# Patient Record
Sex: Female | Born: 1980 | Race: Black or African American | Hispanic: No | Marital: Married | Smoking: Former smoker
Health system: Southern US, Community
[De-identification: ages and names within clinical notes are randomized; demographics above are authoritative.]

## PROBLEM LIST (undated history)

## (undated) DIAGNOSIS — J40 Bronchitis, not specified as acute or chronic: Secondary | ICD-10-CM

## (undated) DIAGNOSIS — C50919 Malignant neoplasm of unspecified site of unspecified female breast: Secondary | ICD-10-CM

## (undated) HISTORY — PX: FOOT SURGERY: SHX648

---
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
## 1998-07-02 ENCOUNTER — Emergency Department (HOSPITAL_COMMUNITY): Admission: EM | Admit: 1998-07-02 | Discharge: 1998-07-02 | Payer: Self-pay

## 1998-07-17 ENCOUNTER — Emergency Department (HOSPITAL_COMMUNITY): Admission: EM | Admit: 1998-07-17 | Discharge: 1998-07-17 | Payer: Self-pay | Admitting: Emergency Medicine

## 1999-05-24 ENCOUNTER — Emergency Department (HOSPITAL_COMMUNITY): Admission: EM | Admit: 1999-05-24 | Discharge: 1999-05-24 | Payer: Self-pay | Admitting: Emergency Medicine

## 1999-12-01 ENCOUNTER — Emergency Department (HOSPITAL_COMMUNITY): Admission: EM | Admit: 1999-12-01 | Discharge: 1999-12-01 | Payer: Self-pay | Admitting: Emergency Medicine

## 2000-02-16 ENCOUNTER — Emergency Department (HOSPITAL_COMMUNITY): Admission: EM | Admit: 2000-02-16 | Discharge: 2000-02-17 | Payer: Self-pay | Admitting: Internal Medicine

## 2002-04-21 ENCOUNTER — Emergency Department (HOSPITAL_COMMUNITY): Admission: EM | Admit: 2002-04-21 | Discharge: 2002-04-21 | Payer: Self-pay | Admitting: Emergency Medicine

## 2003-01-15 ENCOUNTER — Emergency Department (HOSPITAL_COMMUNITY): Admission: EM | Admit: 2003-01-15 | Discharge: 2003-01-15 | Payer: Self-pay | Admitting: Emergency Medicine

## 2003-01-15 ENCOUNTER — Encounter: Payer: Self-pay | Admitting: Emergency Medicine

## 2003-04-06 ENCOUNTER — Emergency Department (HOSPITAL_COMMUNITY): Admission: EM | Admit: 2003-04-06 | Discharge: 2003-04-07 | Payer: Self-pay | Admitting: Emergency Medicine

## 2005-01-09 ENCOUNTER — Emergency Department (HOSPITAL_COMMUNITY): Admission: EM | Admit: 2005-01-09 | Discharge: 2005-01-09 | Payer: Self-pay | Admitting: Emergency Medicine

## 2006-01-01 ENCOUNTER — Emergency Department (HOSPITAL_COMMUNITY): Admission: EM | Admit: 2006-01-01 | Discharge: 2006-01-01 | Payer: Self-pay | Admitting: Emergency Medicine

## 2009-04-24 ENCOUNTER — Emergency Department (HOSPITAL_COMMUNITY): Admission: EM | Admit: 2009-04-24 | Discharge: 2009-04-24 | Payer: Self-pay | Admitting: Emergency Medicine

## 2017-07-11 ENCOUNTER — Emergency Department (HOSPITAL_COMMUNITY)
Admission: EM | Admit: 2017-07-11 | Discharge: 2017-07-11 | Disposition: A | Discharge status: Home / Self Care | Payer: Self-pay | Attending: Emergency Medicine | Admitting: Emergency Medicine

## 2017-07-11 ENCOUNTER — Encounter (HOSPITAL_COMMUNITY): Payer: Self-pay | Admitting: Adult Health

## 2017-07-11 DIAGNOSIS — K0889 Other specified disorders of teeth and supporting structures: Secondary | ICD-10-CM | POA: Insufficient documentation

## 2017-07-11 MED ORDER — NAPROXEN 500 MG PO TABS: 500.0000 mg | ORAL_TABLET | Freq: Two times a day (BID) | ORAL | 0 refills | Status: DC

## 2017-07-11 MED ORDER — PENICILLIN V POTASSIUM 500 MG PO TABS: 500.0000 mg | ORAL_TABLET | Freq: Four times a day (QID) | ORAL | 0 refills | Status: AC

## 2017-07-11 NOTE — ED Provider Notes (Signed)
[facility] DEPT Provider Note   CSN: [id number] Arrival date & time: 07/11/17  7209     History   Chief Complaint Chief Complaint  Patient presents with  . Dental Pain    HPI Alicia Morton is a 36 y.o. female who presents to the emergency department with left-sided dental pain without purulent drainage x6 days. She denies recent trauma or injuries. She reports that she has not established with a dentist and has had problems with her teeth in the past. She denies fever, chills, drooling, facial swelling, trismus, or dysphasia. She reports one dose of ibuprofen 6 days ago but no other treatment prior to arrival.  The history is provided by the patient. No language interpreter was used.    History reviewed. No pertinent past medical history.  There are no active problems to display for this patient.   No past surgical history on file.  OB History    No data available       Home Medications    Prior to Admission medications   Medication Sig Start Date End Date Taking? Authorizing Provider  naproxen (NAPROSYN) 500 MG tablet Take 1 tablet (500 mg total) by mouth 2 (two) times daily. 07/11/17   Nichelle Renwick A, PA-C  penicillin v potassium (VEETID) 500 MG tablet Take 1 tablet (500 mg total) by mouth 4 (four) times daily. 07/11/17 07/18/17  Chynah Orihuela, Laymond Purser, PA-C    Family History History reviewed. No pertinent family history.  Social History Social History  Substance Use Topics  . Smoking status: Not on file  . Smokeless tobacco: Not on file  . Alcohol use Not on file     Allergies   Patient has no known allergies.   Review of Systems Review of Systems  Constitutional: Negative for chills and fever.  HENT: Positive for dental problem. Negative for drooling, facial swelling, mouth sores, sore throat and trouble swallowing.      Physical Exam Updated Vital Signs BP 119/74   Pulse 64   Temp 98.4 F (36.9 C) (Oral)   Resp 18   Wt 83.9 kg (185 lb)   LMP  06/14/2017 (Exact Date)   SpO2 100%   Physical Exam  Constitutional: No distress.  HENT:  Head: Normocephalic.  Mouth/Throat: Uvula is midline, oropharynx is clear and moist and mucous membranes are normal. No oral lesions. No trismus in the jaw. Abnormal dentition. Dental caries present. No dental abscesses or uvula swelling.    Tender to palpation over tooth 18 >15. No obvious abscesses. The surrounding gingiva is intact without erythema or purulence. No no facial swelling, trismus, or woody induration.  Eyes: Conjunctivae are normal.  Neck: Neck supple.  Cardiovascular: Normal rate and regular rhythm.  Exam reveals no gallop and no friction rub.   No murmur heard. Pulmonary/Chest: Effort normal. No respiratory distress.  Abdominal: Soft. She exhibits no distension.  Neurological: She is alert.  Skin: Skin is warm. No rash noted.  Psychiatric: Her behavior is normal.  Nursing note and vitals reviewed.    ED Treatments / Results  Labs (all labs ordered are listed, but only abnormal results are displayed) Labs Reviewed - No data to display  EKG  EKG Interpretation None       Radiology No results found.  Procedures Procedures (including critical care time)  Medications Ordered in ED Medications - No data to display   Initial Impression / Assessment and Plan / ED Course  I have reviewed the triage vital signs and the  nursing notes.  Pertinent labs & imaging results that were available during my care of the patient were reviewed by me and considered in my medical decision making (see chart for details).     Patient with toothache.  No gross abscess.  Exam unconcerning for Ludwig's angina or spread of infection.  Patient declines a dental block at this time. Consulted case management to get the patient establish with primary care for follow-up. Will treat with penicillin and pain medicine.  Urged patient to follow-up with dentist.    Final Clinical Impressions(s) /  ED Diagnoses   Final diagnoses:  Pain, dental    New Prescriptions New Prescriptions   NAPROXEN (NAPROSYN) 500 MG TABLET    Take 1 tablet (500 mg total) by mouth 2 (two) times daily.   PENICILLIN V POTASSIUM (VEETID) 500 MG TABLET    Take 1 tablet (500 mg total) by mouth 4 (four) times daily.     Joline Maxcy A, PA-C 07/11/17 1143    Pixie Casino, MD 07/11/17 1145

## 2017-07-11 NOTE — ED Triage Notes (Signed)
Presents with upper and lower left sided dental pain since Saturday. Tried Ibuprofen Saturday with no relief of pain, no other medications taken prior to arrival.

## 2017-07-11 NOTE — ED Notes (Signed)
Declined W/C at D/C and was escorted to lobby by RN. 

## 2017-07-11 NOTE — Care Management (Addendum)
ED CM received consult to assist patient with f/u primary care. CM reviewed record noted no PCP or health coverage listed in record. Met with patient at bedside, confirmed information. Patient reports not having a PCP or health insurance at. Discussed the [facility] with patient who is agreeable with establishing care at the clinic.   ED CM also discussed the importance and benefits of primary care. Discussed the services rendered at the [facility] .   Follow up appointment scheduled 8/6/ at 2p Information placed on AVS, also provided patient with a [facility] brochure.  Patient agrees with disposition plan to follow up at the clinic and is appreciative for the assistance.  Update the EDP who agrees with discharge plan. No further ED CM needs identified.

## 2017-07-11 NOTE — ED Notes (Signed)
Pt ambulated to room from waiting area. Pt ambulating with a steady gait and had no complaints.

## 2017-07-11 NOTE — Discharge Instructions (Signed)
You may take naproxen up to 2 times a day for pain and inflammation control. You may also apply ice for 15-20 minutes up to 3-4 times a day to help with pain and inflammation control.   Please take penicillin 4 times per day (every 6 hours) for the next 5 days.

## 2017-07-22 ENCOUNTER — Ambulatory Visit (INDEPENDENT_AMBULATORY_CARE_PROVIDER_SITE_OTHER): Payer: Self-pay | Admitting: Physician Assistant

## 2017-07-22 ENCOUNTER — Encounter (INDEPENDENT_AMBULATORY_CARE_PROVIDER_SITE_OTHER): Payer: Self-pay | Admitting: Physician Assistant

## 2017-07-22 VITALS — BP 118/70 | HR 78 | Temp 98.1°F | Ht 68.5 in | Wt 194.4 lb

## 2017-07-22 DIAGNOSIS — G8929 Other chronic pain: Secondary | ICD-10-CM

## 2017-07-22 DIAGNOSIS — K089 Disorder of teeth and supporting structures, unspecified: Secondary | ICD-10-CM

## 2017-07-22 MED ORDER — NAPROXEN 500 MG PO TABS: 500.0000 mg | ORAL_TABLET | Freq: Two times a day (BID) | ORAL | 0 refills | Status: DC

## 2017-07-22 MED ORDER — AMOXICILLIN-POT CLAVULANATE 875-125 MG PO TABS: 1.0000 | ORAL_TABLET | Freq: Two times a day (BID) | ORAL | 0 refills | Status: AC

## 2017-07-22 NOTE — Progress Notes (Signed)
Subjective:  Patient ID: Alicia Morton, female    DOB: 06-13-1981  Age: 36 y.o. MRN: [id number]  CC: dental pain  HPI Alicia Morton is a 36 y.o. female with a PMH of chronic dental pain. Had left sided dental pain that led her to the ED on 07/11/17. Took the prescribed antibiotic and NSAID with resolution of the dental swelling. There is still pain especially with chewing, drinking, and air contact. Denies CP, rash, urinary dysfunction, facial swelling, drooling, neck swelling, or sublingual swelling. Does not endorse any other symptom or complaint.  ROS Review of Systems  Constitutional: Negative for chills, fever and malaise/fatigue.  HENT:       Dental pain  Eyes: Negative for blurred vision.  Respiratory: Negative for shortness of breath.   Cardiovascular: Negative for chest pain and palpitations.  Gastrointestinal: Negative for abdominal pain and nausea.  Genitourinary: Negative for dysuria and hematuria.  Musculoskeletal: Negative for joint pain and myalgias.  Skin: Negative for rash.  Neurological: Negative for tingling and headaches.  Psychiatric/Behavioral: Negative for depression. The patient is not nervous/anxious.     Objective:  BP 118/70 (BP Location: Left Arm, Patient Position: Sitting, Cuff Size: Normal)   Pulse 78   Temp 98.1 F (36.7 C) (Oral)   Ht 5' 8.5" (1.74 m)   Wt 194 lb 6.4 oz (88.2 kg)   LMP 07/14/2017 (Exact Date)   SpO2 97%   BMI 29.13 kg/m   BP/Weight 07/22/2017 04/21/3975  Systolic BP 734 193  Diastolic BP 70 74  Wt. (Lbs) 194.4 185  BMI 29.13 -      Physical Exam  Constitutional: She is oriented to person, place, and time.  Well developed, well nourished, NAD, polite  HENT:  Head: Normocephalic and atraumatic.  Tooth number 18 and 1 in poor condition, no gingival swelling, suppuration, or bleeding.  Eyes: No scleral icterus.  Neck: Normal range of motion. Neck supple.  Cardiovascular: Normal rate, regular rhythm and normal heart  sounds.  Exam reveals no gallop and no friction rub.   No murmur heard. Pulmonary/Chest: Effort normal and breath sounds normal.  Musculoskeletal: She exhibits no edema.  Lymphadenopathy:    She has no cervical adenopathy.  Neurological: She is alert and oriented to person, place, and time. No cranial nerve deficit. Coordination normal.  Skin: Skin is warm and dry. No rash noted. No erythema. No pallor.  Psychiatric: She has a normal mood and affect. Her behavior is normal. Thought content normal.  Vitals reviewed.    Assessment & Plan:     1. Chronic dental pain - Begin amoxicillin-clavulanate (AUGMENTIN) 875-125 MG tablet; Take 1 tablet by mouth 2 (two) times daily.  Dispense: 20 tablet; Refill: 0 - Refill naproxen (NAPROSYN) 500 MG tablet; Take 1 tablet (500 mg total) by mouth 2 (two) times daily.  Dispense: 30 tablet; Refill: 0 - Ambulatory referral to Dentistry   Meds ordered this encounter  Medications  . amoxicillin-clavulanate (AUGMENTIN) 875-125 MG tablet    Sig: Take 1 tablet by mouth 2 (two) times daily.    Dispense:  20 tablet    Refill:  0    Order Specific Question:   Supervising Provider    Answer:   Tresa Garter [id number]  . naproxen (NAPROSYN) 500 MG tablet    Sig: Take 1 tablet (500 mg total) by mouth 2 (two) times daily.    Dispense:  30 tablet    Refill:  0    Order Specific Question:  Supervising Provider    Answer:   Tresa Garter [[id number]]    Follow-up: PRN  Clent Demark PA

## 2017-07-22 NOTE — Patient Instructions (Signed)
Dental Pain Dental pain may be caused by many things, including:  Tooth decay (cavities or caries). Cavities expose the nerve of your tooth to air and hot or cold temperatures. This can cause pain or discomfort.  Abscess or infection. A dental abscess is a collection of infected pus from a bacterial infection in the inner part of the tooth (pulp). It usually occurs at the end of the tooth's root.  Injury.  An unknown reason (idiopathic).  Your pain may be mild or severe. It may only occur when:  You are chewing.  You are exposed to hot or cold temperature.  You are eating or drinking sugary foods or beverages, such as soda or candy.  Your pain may also be constant. Follow these instructions at home: Watch your dental pain for any changes. The following actions may help to lessen any discomfort that you are feeling:  Take medicines only as directed by your dentist.  If you were prescribed an antibiotic medicine, finish all of it even if you start to feel better.  Keep all follow-up visits as directed by your dentist. This is important.  Do not apply heat to the outside of your face.  Rinse your mouth or gargle with salt water if directed by your dentist. This helps with pain and swelling. ? You can make salt water by adding  tsp of salt to 1 cup of warm water.  Apply ice to the painful area of your face: ? Put ice in a plastic bag. ? Place a towel between your skin and the bag. ? Leave the ice on for 20 minutes, 2-3 times per day.  Avoid foods or drinks that cause you pain, such as: ? Very hot or very cold foods or drinks. ? Sweet or sugary foods or drinks.  Contact a health care provider if:  Your pain is not controlled with medicines.  Your symptoms are worse.  You have new symptoms. Get help right away if:  You are unable to open your mouth.  You are having trouble breathing or swallowing.  You have a fever.  Your face, neck, or jaw is swollen. This  information is not intended to replace advice given to you by your health care provider. Make sure you discuss any questions you have with your health care provider. Document Released: 12/03/2005 Document Revised: 04/12/2016 Document Reviewed: 11/29/2014 Elsevier Interactive Patient Education  2017 [employer].

## 2020-03-24 ENCOUNTER — Other Ambulatory Visit: Payer: Self-pay

## 2020-03-24 ENCOUNTER — Ambulatory Visit (INDEPENDENT_AMBULATORY_CARE_PROVIDER_SITE_OTHER): Payer: Self-pay

## 2020-03-24 ENCOUNTER — Ambulatory Visit
Admission: EM | Admit: 2020-03-24 | Discharge: 2020-03-24 | Disposition: A | Discharge status: Home / Self Care | Payer: Self-pay

## 2020-03-24 DIAGNOSIS — J42 Unspecified chronic bronchitis: Secondary | ICD-10-CM

## 2020-03-24 DIAGNOSIS — R062 Wheezing: Secondary | ICD-10-CM

## 2020-03-24 HISTORY — DX: Bronchitis, not specified as acute or chronic: J40

## 2020-03-24 MED ORDER — PREDNISONE 20 MG PO TABS: 40.0000 mg | ORAL_TABLET | Freq: Every day | ORAL | 0 refills | Status: DC

## 2020-03-24 MED ORDER — ALBUTEROL SULFATE 0.63 MG/3ML IN NEBU: 1.0000 | INHALATION_SOLUTION | Freq: Four times a day (QID) | RESPIRATORY_TRACT | 0 refills | Status: DC | PRN

## 2020-03-24 NOTE — ED Triage Notes (Signed)
Pt c/o wheezing and SOB since last Tuesday. States it started with a cough and congestion that was treated with OTC over a week ago. States using her inhaler with some relief, out of neb meds and her machine not working right. Pt speaking in complete sentences, no distress at this time.

## 2020-03-24 NOTE — ED Provider Notes (Signed)
[name]    CSN: [id number] Arrival date & time: 03/24/20  1144      History   Chief Complaint Chief Complaint  Patient presents with  . Wheezing    HPI Alicia Morton is a 39 y.o. female with history of chronic bronchitis, former tobacco use presenting for wheezing and increased difficulty breathing since last Tuesday.  Patient states that she has productive cough without hemoptysis and can hear herself wheezing.  States she has a nebulizer at home, though does not work like it used to: Unsure if is broken.  Does not have albuterol nebulizer solution at home.  Has been taking OTC medications for cough with moderate relief.  Patient denying fever, known sick contacts, chest pain or palpitations, lightheadedness, nausea.  Patient states this feels consistent to previous flares, for which she usually goes to the ER to get a nebulizer treatment and steroids.  Patient denies hospitalization for respiratory issues.   Past Medical History:  Diagnosis Date  . Bronchitis     There are no problems to display for this patient.   History reviewed. No pertinent surgical history.  OB History   No obstetric history on file.      Home Medications    Prior to Admission medications   Medication Sig Start Date End Date Taking? Authorizing Provider  albuterol (VENTOLIN HFA) 108 (90 Base) MCG/ACT inhaler Inhale into the lungs every 6 (six) hours as needed for wheezing or shortness of breath.   Yes [provider]  albuterol (ACCUNEB) 0.63 MG/3ML nebulizer solution Take 3 mLs (0.63 mg total) by nebulization every 6 (six) hours as needed for wheezing. 03/24/20   Hall-Potvin, [place], PA-C  predniSONE (DELTASONE) 20 MG tablet Take 2 tablets (40 mg total) by mouth daily. 03/24/20   Hall-Potvin, [place], PA-C    Family History History reviewed. No pertinent family history.  Social History Social History   Tobacco Use  . Smoking status: Never Smoker  . Smokeless  tobacco: Never Used  Substance Use Topics  . Alcohol use: Not Currently  . Drug use: Not Currently     Allergies   Patient has no known allergies.   Review of Systems As per HPI   Physical Exam Triage Vital Signs ED Triage Vitals  Enc Vitals Group     BP      Pulse      Resp      Temp      Temp src      SpO2      Weight      Height      Head Circumference      Peak Flow      Pain Score      Pain Loc      Pain Edu?      Excl. in [place]?    No data found.  Updated Vital Signs BP 139/82 (BP Location: Left Arm)   Pulse 67   Temp 98.1 F (36.7 C) (Oral)   Resp 18   LMP 02/24/2020   SpO2 94%   Visual Acuity Right Eye Distance:   Left Eye Distance:   Bilateral Distance:    Right Eye Near:   Left Eye Near:    Bilateral Near:     Physical Exam Constitutional:      General: She is not in acute distress.    Appearance: She is obese. She is not ill-appearing or diaphoretic.  HENT:     Head: Normocephalic and atraumatic.  Mouth/Throat:     Mouth: Mucous membranes are moist.     Pharynx: Oropharynx is clear. No oropharyngeal exudate or posterior oropharyngeal erythema.  Eyes:     General: No scleral icterus.    Conjunctiva/sclera: Conjunctivae normal.     Pupils: Pupils are equal, round, and reactive to light.  Neck:     Comments: Trachea midline, negative JVD Cardiovascular:     Rate and Rhythm: Normal rate and regular rhythm.     Heart sounds: No murmur. No gallop.   Pulmonary:     Effort: Pulmonary effort is normal. No respiratory distress.     Breath sounds: Wheezing and rhonchi present. No rales.     Comments: Adventitia noted bilaterally.  Decreased air movement Musculoskeletal:     Cervical back: Neck supple. No tenderness.  Lymphadenopathy:     Cervical: No cervical adenopathy.  Skin:    Capillary Refill: Capillary refill takes less than 2 seconds.     Coloration: Skin is not jaundiced or pale.     Findings: No rash.  Neurological:      General: No focal deficit present.     Mental Status: She is alert and oriented to person, place, and time.      UC Treatments / Results  Labs (all labs ordered are listed, but only abnormal results are displayed) Labs Reviewed - No data to display  EKG   Radiology DG Chest 2 View  Result Date: 03/24/2020 CLINICAL DATA:  Cough, dyspnea. EXAM: CHEST - 2 VIEW COMPARISON:  Apr 24, 2009. FINDINGS: The heart size and mediastinal contours are within normal limits. Both lungs are clear. No pneumothorax or pleural effusion is noted. The visualized skeletal structures are unremarkable. IMPRESSION: No active cardiopulmonary disease. Electronically Signed   By: Marijo Conception M.D.   On: 03/24/2020 12:41    Procedures Procedures (including critical care time)  Medications Ordered in UC Medications - No data to display  Initial Impression / Assessment and Plan / UC Course  I have reviewed the triage vital signs and the nursing notes.  Pertinent labs & imaging results that were available during my care of the patient were reviewed by me and considered in my medical decision making (see chart for details).     Patient afebrile, nontoxic, and without acute respiratory distress.  Given history of chronic bronchitis, cough greater than 1 week with dyspnea and poor lung exam x-ray obtained.  This is reviewed by me and radiology: Negative for pneumothorax, effusion, infiltrate.  Reviewed findings with patient who verbalized understanding.  Will treat for chronic bronchitis as outlined below.  Patient received nebulizer equipment in office: Forms completed at time of visit.  Return precautions discussed, patient verbalized understanding and is agreeable to plan. Final Clinical Impressions(s) / UC Diagnoses   Final diagnoses:  Chronic bronchitis, unspecified chronic bronchitis type [facility])     Discharge Instructions     Use nebulizer as directed. Take prednisone every morning with  breakfast. Follow-up with primary care for persistent or worsening symptoms.    ED Prescriptions    Medication Sig Dispense Auth. Provider   albuterol (ACCUNEB) 0.63 MG/3ML nebulizer solution Take 3 mLs (0.63 mg total) by nebulization every 6 (six) hours as needed for wheezing. 75 mL Hall-Potvin, [place], PA-C   predniSONE (DELTASONE) 20 MG tablet Take 2 tablets (40 mg total) by mouth daily. 5 tablet Hall-Potvin, [place], PA-C     PDMP not reviewed this encounter.   Hall-Potvin, [place], [place] 03/24/20 1315

## 2020-03-24 NOTE — Discharge Instructions (Addendum)
Use nebulizer as directed. Take prednisone every morning with breakfast. Follow-up with primary care for persistent or worsening symptoms.

## 2020-04-06 ENCOUNTER — Ambulatory Visit
Admission: EM | Admit: 2020-04-06 | Discharge: 2020-04-06 | Disposition: A | Discharge status: Home / Self Care | Payer: Self-pay

## 2020-10-10 ENCOUNTER — Ambulatory Visit
Admission: EM | Admit: 2020-10-10 | Discharge: 2020-10-10 | Disposition: A | Discharge status: Home / Self Care | Payer: Self-pay | Attending: Physician Assistant | Admitting: Physician Assistant

## 2020-10-10 DIAGNOSIS — J209 Acute bronchitis, unspecified: Secondary | ICD-10-CM

## 2020-10-10 MED ORDER — ALBUTEROL SULFATE 0.63 MG/3ML IN NEBU: 1.0000 | INHALATION_SOLUTION | Freq: Four times a day (QID) | RESPIRATORY_TRACT | 0 refills | Status: DC | PRN

## 2020-10-10 MED ORDER — ALBUTEROL SULFATE HFA 108 (90 BASE) MCG/ACT IN AERS: 1.0000 | INHALATION_SPRAY | Freq: Four times a day (QID) | RESPIRATORY_TRACT | 0 refills | Status: DC | PRN

## 2020-10-10 MED ORDER — ALBUTEROL SULFATE HFA 108 (90 BASE) MCG/ACT IN AERS
2.0000 | INHALATION_SPRAY | Freq: Once | RESPIRATORY_TRACT | Status: AC
  Administered 2020-10-10: 2 via RESPIRATORY_TRACT

## 2020-10-10 MED ORDER — PREDNISONE 50 MG PO TABS: 50.0000 mg | ORAL_TABLET | Freq: Every day | ORAL | 0 refills | Status: DC

## 2020-10-10 NOTE — ED Triage Notes (Signed)
Pt c/o bronchitis flare up, dry cough since last Monday with SOB. Pt speaking in complete sentences, no distress noted.

## 2020-10-10 NOTE — Discharge Instructions (Signed)
Continue albuterol as needed. Add prednisone if symptoms not improving. Follow up with PCP if requiring frequency use of inhaler.  Monitor for any worsening of symptoms, chest pain, shortness of breath, wheezing, swelling of the throat, go to the emergency department for further evaluation needed.

## 2020-10-10 NOTE — ED Provider Notes (Signed)
[facility]    CSN: [id number] Arrival date & time: 10/10/20  1336      History   Chief Complaint Chief Complaint  Patient presents with  . Cough    HPI Alicia Morton is a 39 y.o. female.   39 year old female comes in for 1 week history of cough. Short of breath with chest tightness, worse with exertion. Denies rhinorrhea, nasal congestion, sore throat. Denies fever, chills, body aches. Denies loss of taste/smell. Former smoker.      Past Medical History:  Diagnosis Date  . Bronchitis     There are no problems to display for this patient.   History reviewed. No pertinent surgical history.  OB History   No obstetric history on file.      Home Medications    Prior to Admission medications   Medication Sig Start Date End Date Taking? Authorizing Provider  albuterol (ACCUNEB) 0.63 MG/3ML nebulizer solution Take 3 mLs (0.63 mg total) by nebulization every 6 (six) hours as needed for wheezing. 10/10/20   Tasia Catchings, Diar Berkel V, PA-C  albuterol (VENTOLIN HFA) 108 (90 Base) MCG/ACT inhaler Inhale 1-2 puffs into the lungs every 6 (six) hours as needed for wheezing or shortness of breath. 10/10/20   Tasia Catchings, Nayshawn Mesta V, PA-C  predniSONE (DELTASONE) 50 MG tablet Take 1 tablet (50 mg total) by mouth daily with breakfast. 10/10/20   Ok Edwards, PA-C    Family History History reviewed. No pertinent family history.  Social History Social History   Tobacco Use  . Smoking status: Former Smoker    Packs/day: 1.00    Years: 16.00    Pack years: 16.00    Types: Cigarettes, E-cigarettes  . Smokeless tobacco: Never Used  Substance Use Topics  . Alcohol use: Not Currently  . Drug use: Not Currently     Allergies   Patient has no known allergies.   Review of Systems Review of Systems  Reason unable to perform ROS: See HPI as above.    Physical Exam Triage Vital Signs ED Triage Vitals [10/10/20 1439]  Enc Vitals Group     BP 129/78     Pulse Rate 69     Resp 16      Temp 98.8 F (37.1 C)     Temp Source Oral     SpO2 98 %     Weight      Height      Head Circumference      Peak Flow      Pain Score      Pain Loc      Pain Edu?      Excl. in [place]?    No data found.  Updated Vital Signs BP 129/78 (BP Location: Left Arm)   Pulse 69   Temp 98.8 F (37.1 C) (Oral)   Resp 16   LMP 09/11/2020   SpO2 98%   Physical Exam Constitutional:      General: She is not in acute distress.    Appearance: Normal appearance. She is not ill-appearing, toxic-appearing or diaphoretic.  HENT:     Head: Normocephalic and atraumatic.     Mouth/Throat:     Mouth: Mucous membranes are moist.     Pharynx: Oropharynx is clear. Uvula midline.  Cardiovascular:     Rate and Rhythm: Normal rate and regular rhythm.     Heart sounds: Normal heart sounds. No murmur heard.  No friction rub. No gallop.   Pulmonary:  Effort: Pulmonary effort is normal. No accessory muscle usage, prolonged expiration, respiratory distress or retractions.     Comments: Decreased air movement. Diffuse expiratory wheezing and rhonchi.   Albuterol 2 puffs x 1: improvement of air movement. Increased inspiratory and expiratory wheezing. Rhonchi in the periphery  Albuterol 2 puffs x 2: good air movement. LCTAB Musculoskeletal:     Cervical back: Normal range of motion and neck supple.  Neurological:     General: No focal deficit present.     Mental Status: She is alert and oriented to person, place, and time.      UC Treatments / Results  Labs (all labs ordered are listed, but only abnormal results are displayed) Labs Reviewed - No data to display  EKG   Radiology No results found.  Procedures Procedures (including critical care time)  Medications Ordered in UC Medications  albuterol (VENTOLIN HFA) 108 (90 Base) MCG/ACT inhaler 2 puff (2 puffs Inhalation Given 10/10/20 1513)    Initial Impression / Assessment and Plan / UC Course  I have reviewed the triage vital signs  and the nursing notes.  Pertinent labs & imaging results that were available during my care of the patient were reviewed by me and considered in my medical decision making (see chart for details).    Albuterol as directed. Rx of prednisone, if symptoms not improving with albuterol, to take for bronchitis. No increase in mucous production, LCTAB after albuterol, no indication for abx at this time. Return precautions given.  Final Clinical Impressions(s) / UC Diagnoses   Final diagnoses:  Acute bronchitis, unspecified organism    ED Prescriptions    Medication Sig Dispense Auth. Provider   albuterol (ACCUNEB) 0.63 MG/3ML nebulizer solution Take 3 mLs (0.63 mg total) by nebulization every 6 (six) hours as needed for wheezing. 75 mL Herberta Pickron V, PA-C   albuterol (VENTOLIN HFA) 108 (90 Base) MCG/ACT inhaler Inhale 1-2 puffs into the lungs every 6 (six) hours as needed for wheezing or shortness of breath. 18 g Harlon Kutner V, PA-C   predniSONE (DELTASONE) 50 MG tablet Take 1 tablet (50 mg total) by mouth daily with breakfast. 5 tablet Ok Edwards, PA-C     PDMP not reviewed this encounter.   Ok Edwards, PA-C 10/10/20 1514

## 2020-11-22 ENCOUNTER — Other Ambulatory Visit: Payer: Self-pay

## 2020-11-22 ENCOUNTER — Telehealth: Payer: Self-pay

## 2020-11-22 ENCOUNTER — Ambulatory Visit
Admission: EM | Admit: 2020-11-22 | Discharge: 2020-11-22 | Disposition: A | Discharge status: Home / Self Care | Payer: Self-pay

## 2020-11-22 DIAGNOSIS — Z1152 Encounter for screening for COVID-19: Secondary | ICD-10-CM

## 2020-11-22 DIAGNOSIS — J209 Acute bronchitis, unspecified: Secondary | ICD-10-CM

## 2020-11-22 MED ORDER — BENZONATATE 200 MG PO CAPS: 200.0000 mg | ORAL_CAPSULE | Freq: Three times a day (TID) | ORAL | 0 refills | Status: DC | PRN

## 2020-11-22 MED ORDER — IPRATROPIUM-ALBUTEROL 0.5-2.5 (3) MG/3ML IN SOLN: 3.0000 mL | RESPIRATORY_TRACT | 0 refills | Status: DC | PRN

## 2020-11-22 MED ORDER — FLUTICASONE PROPIONATE 50 MCG/ACT NA SUSP
1.0000 | Freq: Every day | NASAL | 0 refills | Status: DC
Start: 2020-11-22 — End: 2021-04-18

## 2020-11-22 MED ORDER — HYDROCODONE-HOMATROPINE 5-1.5 MG/5ML PO SYRP
5.0000 mL | ORAL_SOLUTION | Freq: Every evening | ORAL | 0 refills | Status: DC | PRN
Start: 2020-11-22 — End: 2021-04-15

## 2020-11-22 MED ORDER — PREDNISONE 50 MG PO TABS: 50.0000 mg | ORAL_TABLET | Freq: Every day | ORAL | 0 refills | Status: DC

## 2020-11-22 MED ORDER — DEXAMETHASONE 10 MG/ML FOR PEDIATRIC ORAL USE: 10.0000 mg | Freq: Once | INTRAMUSCULAR | Status: DC

## 2020-11-22 MED ORDER — PREDNISONE 50 MG PO TABS: 50.0000 mg | ORAL_TABLET | Freq: Every day | ORAL | 0 refills | Status: AC

## 2020-11-22 MED ORDER — BENZONATATE 200 MG PO CAPS: 200.0000 mg | ORAL_CAPSULE | Freq: Three times a day (TID) | ORAL | 0 refills | Status: AC | PRN

## 2020-11-22 MED ORDER — HYDROCODONE-HOMATROPINE 5-1.5 MG/5ML PO SYRP
5.0000 mL | ORAL_SOLUTION | Freq: Every evening | ORAL | 0 refills | Status: DC | PRN
Start: 2020-11-22 — End: 2020-11-22

## 2020-11-22 MED ORDER — FLUTICASONE PROPIONATE 50 MCG/ACT NA SUSP
1.0000 | Freq: Every day | NASAL | 0 refills | Status: DC
Start: 2020-11-22 — End: 2020-11-22

## 2020-11-22 NOTE — Discharge Instructions (Signed)
Covid test pending We gave you a dose of Decadron today, continue with prednisone daily for the next 5 days with food Continue albuterol inhaler as needed, continue breathing treatments when at home Tessalon for cough Hycodan for nighttime cough-will cause drowsiness, do not drive or work after taking Rest and fluids Follow-up if not improving or worsening

## 2020-11-22 NOTE — Telephone Encounter (Signed)
TC to [facility] spoke w/ Damian Leavell from pharmacy. Cancelled all Rx's prescribed today by H. Wieters, PA d/t pt's request to change pharmacies after Rx's were sent.

## 2020-11-22 NOTE — ED Triage Notes (Signed)
Pt presents to Urgent Care with c/o cough and R ear pain x approx 1 week.  Pt w/ no known COVID exposure; has not been vaccinated.

## 2020-11-22 NOTE — ED Provider Notes (Signed)
[name]    CSN: [id number] Arrival date & time: 11/22/20  1350      History   Chief Complaint Chief Complaint  Patient presents with  . Cough  . Otalgia    HPI Alicia Morton is a 39 y.o. female presenting today for evaluation of cough and congestion.  Reports symptoms over the past week, reports a lot of shortness of breath chest tightness and wheezing.  History of recurrent bronchitis.  Reports former tobacco use, quit approximately 10 years ago.  Has been using albuterol inhaler and nebulizers at home without full relief.  Typically has improvement with steroids.  Cough keeping up at nighttime.  Denies fevers.  Denies Covid exposures.  HPI  Past Medical History:  Diagnosis Date  . Bronchitis     There are no problems to display for this patient.   History reviewed. No pertinent surgical history.  OB History   No obstetric history on file.      Home Medications    Prior to Admission medications   Medication Sig Start Date End Date Taking? Authorizing Provider  albuterol (ACCUNEB) 0.63 MG/3ML nebulizer solution Take 3 mLs (0.63 mg total) by nebulization every 6 (six) hours as needed for wheezing. 10/10/20   Tasia Catchings, Amy V, PA-C  albuterol (VENTOLIN HFA) 108 (90 Base) MCG/ACT inhaler Inhale 1-2 puffs into the lungs every 6 (six) hours as needed for wheezing or shortness of breath. 10/10/20   Tasia Catchings, Amy V, PA-C  benzonatate (TESSALON) 200 MG capsule Take 1 capsule (200 mg total) by mouth 3 (three) times daily as needed for up to 7 days for cough. 11/22/20 11/29/20  Kanesha Cadle C, PA-C  HYDROcodone-homatropine (HYCODAN) 5-1.5 MG/5ML syrup Take 5 mLs by mouth at bedtime as needed for cough. 11/22/20   Cayley Pester C, PA-C  ipratropium-albuterol (DUONEB) 0.5-2.5 (3) MG/3ML SOLN Take 3 mLs by nebulization every 4 (four) hours as needed. 11/22/20   Santanna Whitford C, PA-C  predniSONE (DELTASONE) 50 MG tablet Take 1 tablet (50 mg total) by mouth daily with  breakfast for 5 days. 11/22/20 11/27/20  Khalidah Herbold, Elesa Hacker, PA-C    Family History Family History  Problem Relation Age of Onset  . Hypertension Mother   . Hypertension Father   . Diabetes Father     Social History Social History   Tobacco Use  . Smoking status: Former Smoker    Packs/day: 1.00    Years: 16.00    Pack years: 16.00    Types: Cigarettes, E-cigarettes  . Smokeless tobacco: Never Used  Substance Use Topics  . Alcohol use: Not Currently  . Drug use: Not Currently     Allergies   Patient has no known allergies.   Review of Systems Review of Systems  Constitutional: Negative for activity change, appetite change, chills, fatigue and fever.  HENT: Positive for congestion. Negative for ear pain, rhinorrhea, sinus pressure, sore throat and trouble swallowing.   Eyes: Negative for discharge and redness.  Respiratory: Positive for cough. Negative for chest tightness and shortness of breath.   Cardiovascular: Negative for chest pain.  Gastrointestinal: Negative for abdominal pain, diarrhea, nausea and vomiting.  Musculoskeletal: Negative for myalgias.  Skin: Negative for rash.  Neurological: Negative for dizziness, light-headedness and headaches.     Physical Exam Triage Vital Signs ED Triage Vitals  Enc Vitals Group     BP      Pulse      Resp      Temp  Temp src      SpO2      Weight      Height      Head Circumference      Peak Flow      Pain Score      Pain Loc      Pain Edu?      Excl. in [place]?    No data found.  Updated Vital Signs BP (!) 122/57 (BP Location: Left Arm)   Pulse 70   Temp 98.1 F (36.7 C) (Oral)   Resp 20   Ht 5\' 10"  (1.778 m)   Wt 205 lb (93 kg)   LMP 11/12/2020   SpO2 93%   BMI 29.41 kg/m   Visual Acuity Right Eye Distance:   Left Eye Distance:   Bilateral Distance:    Right Eye Near:   Left Eye Near:    Bilateral Near:     Physical Exam Vitals and nursing note reviewed.  Constitutional:       Appearance: She is well-developed.     Comments: No acute distress  HENT:     Head: Normocephalic and atraumatic.     Ears:     Comments: Bilateral ears without tenderness to palpation of external auricle, tragus and mastoid, EAC's without erythema or swelling, TM's with good bony landmarks and cone of light. Non erythematous.     Nose: Nose normal.     Mouth/Throat:     Comments: Oral mucosa pink and moist, no tonsillar enlargement or exudate. Posterior pharynx patent and nonerythematous, no uvula deviation or swelling. Normal phonation. Eyes:     Conjunctiva/sclera: Conjunctivae normal.  Cardiovascular:     Rate and Rhythm: Normal rate.  Pulmonary:     Effort: Pulmonary effort is normal. No respiratory distress.     Comments: Breathing comfortably at rest, inspiratory and expiratory wheezing throughout bilateral lung fields Abdominal:     General: There is no distension.  Musculoskeletal:        General: Normal range of motion.     Cervical back: Neck supple.  Skin:    General: Skin is warm and dry.  Neurological:     Mental Status: She is alert and oriented to person, place, and time.      UC Treatments / Results  Labs (all labs ordered are listed, but only abnormal results are displayed) Labs Reviewed  NOVEL CORONAVIRUS, NAA    EKG   Radiology No results found.  Procedures Procedures (including critical care time)  Medications Ordered in UC Medications  dexamethasone (DECADRON) 10 MG/ML injection for Pediatric ORAL use 10 mg (has no administration in time range)    Initial Impression / Assessment and Plan / UC Course  I have reviewed the triage vital signs and the nursing notes.  Pertinent labs & imaging results that were available during my care of the patient were reviewed by me and considered in my medical decision making (see chart for details).     Covid test pending, treating for bronchitis, continue albuterol inhaler nebulizers, providing DuoNeb.   Providing Decadron prior to discharge and continuing on prednisone x5 days.  Tessalon for daytime cough, Hycodan for nighttime cough.  Continue to monitor breathing and wheezing,Discussed strict return precautions. Patient verbalized understanding and is agreeable with plan.  Final Clinical Impressions(s) / UC Diagnoses   Final diagnoses:  Encounter for screening for COVID-19  Acute bronchitis, unspecified organism     Discharge Instructions     Covid test pending We gave  you a dose of Decadron today, continue with prednisone daily for the next 5 days with food Continue albuterol inhaler as needed, continue breathing treatments when at home Tessalon for cough Hycodan for nighttime cough-will cause drowsiness, do not drive or work after taking Rest and fluids Follow-up if not improving or worsening    ED Prescriptions    Medication Sig Dispense Auth. Provider   ipratropium-albuterol (DUONEB) 0.5-2.5 (3) MG/3ML SOLN Take 3 mLs by nebulization every 4 (four) hours as needed. 360 mL Mikia Delaluz C, PA-C   predniSONE (DELTASONE) 50 MG tablet Take 1 tablet (50 mg total) by mouth daily with breakfast for 5 days. 5 tablet Antoniette Peake C, PA-C   benzonatate (TESSALON) 200 MG capsule Take 1 capsule (200 mg total) by mouth 3 (three) times daily as needed for up to 7 days for cough. 28 capsule Dulcie Gammon C, PA-C   HYDROcodone-homatropine (HYCODAN) 5-1.5 MG/5ML syrup Take 5 mLs by mouth at bedtime as needed for cough. 75 mL Pete Merten, [place] C, PA-C     PDMP not reviewed this encounter.   Janith Lima, PA-C 11/22/20 1424

## 2020-11-24 LAB — NOVEL CORONAVIRUS, NAA

## 2021-02-15 ENCOUNTER — Ambulatory Visit
Admission: EM | Admit: 2021-02-15 | Discharge: 2021-02-15 | Disposition: A | Discharge status: Home / Self Care | Payer: Self-pay | Attending: Family Medicine | Admitting: Family Medicine

## 2021-02-15 ENCOUNTER — Encounter: Payer: Self-pay | Admitting: Emergency Medicine

## 2021-02-15 ENCOUNTER — Other Ambulatory Visit: Payer: Self-pay

## 2021-02-15 DIAGNOSIS — K047 Periapical abscess without sinus: Secondary | ICD-10-CM

## 2021-02-15 MED ORDER — AMOXICILLIN 875 MG PO TABS: 875.0000 mg | ORAL_TABLET | Freq: Two times a day (BID) | ORAL | 0 refills | Status: DC

## 2021-02-15 MED ORDER — NAPROXEN 500 MG PO TABS: 500.0000 mg | ORAL_TABLET | Freq: Two times a day (BID) | ORAL | 0 refills | Status: DC

## 2021-02-15 NOTE — ED Triage Notes (Signed)
Patient c/o toothache x 3 days.   Patient endorses pain originating from LT lower jaw "wisdom tooth".   Patient endorses this is the second time the toothache has become noticeable.   Patient has taken Ibuprofen at home with no relief of symptoms.

## 2021-02-15 NOTE — ED Provider Notes (Signed)
[name]    CSN: [id number] Arrival date & time: 02/15/21  0857      History   Chief Complaint Chief Complaint  Patient presents with  . Dental Pain    HPI Alicia Morton is a 40 y.o. female.   HPI  Patient in today for evaluation of dental pain and swelling involving the left lower molar/wisdom tooth.  Patient has had recurrent abscess involving the gum tissue surrounding her left molar tooth.  Patient is without dental insurance and has been unable to schedule extraction of tooth.  Patient is afebrile.  Has been taking 800 mg of ibuprofen to help with facial swelling.  She has no redness no periorbital swelling and denies any pressure of the left side of face.  She is able to open and close her mouth.  Denies any difficulty swallowing.  She is afebrile  Past Medical History:  Diagnosis Date  . Bronchitis     There are no problems to display for this patient.   History reviewed. No pertinent surgical history.  OB History   No obstetric history on file.      Home Medications    Prior to Admission medications   Medication Sig Start Date End Date Taking? Authorizing Provider  albuterol (VENTOLIN HFA) 108 (90 Base) MCG/ACT inhaler Inhale 1-2 puffs into the lungs every 6 (six) hours as needed for wheezing or shortness of breath. 10/10/20  Yes Yu, Amy V, PA-C  albuterol (ACCUNEB) 0.63 MG/3ML nebulizer solution Take 3 mLs (0.63 mg total) by nebulization every 6 (six) hours as needed for wheezing. 10/10/20   Tasia Catchings, Amy V, PA-C  fluticasone (FLONASE) 50 MCG/ACT nasal spray Place 1-2 sprays into both nostrils daily. 11/22/20   Wieters, Hallie C, PA-C  HYDROcodone-homatropine (HYCODAN) 5-1.5 MG/5ML syrup Take 5 mLs by mouth at bedtime as needed for cough. 11/22/20   Wieters, Hallie C, PA-C  ipratropium-albuterol (DUONEB) 0.5-2.5 (3) MG/3ML SOLN Take 3 mLs by nebulization every 4 (four) hours as needed. 11/22/20   Wieters, Elesa Hacker, PA-C    Family History Family  History  Problem Relation Age of Onset  . Hypertension Mother   . Hypertension Father   . Diabetes Father     Social History Social History   Tobacco Use  . Smoking status: Former Smoker    Packs/day: 1.00    Years: 16.00    Pack years: 16.00    Types: Cigarettes, E-cigarettes  . Smokeless tobacco: Never Used  Substance Use Topics  . Alcohol use: Not Currently  . Drug use: Not Currently     Allergies   Patient has no known allergies.  Review of Systems Review of Systems Pertinent negatives listed in HPI   Physical Exam Triage Vital Signs ED Triage Vitals  Enc Vitals Group     BP 02/15/21 0905 116/75     Pulse Rate 02/15/21 0905 70     Resp 02/15/21 0905 14     Temp 02/15/21 0905 98.4 F (36.9 C)     Temp Source 02/15/21 0905 Oral     SpO2 02/15/21 0905 96 %     Weight --      Height --      Head Circumference --      Peak Flow --      Pain Score 02/15/21 0903 5     Pain Loc --      Pain Edu? --      Excl. in [place]? --    No data  found.  Updated Vital Signs BP 116/75 (BP Location: Left Arm)   Pulse 70   Temp 98.4 F (36.9 C) (Oral)   Resp 14   LMP 02/12/2021   SpO2 96%   Visual Acuity Right Eye Distance:   Left Eye Distance:   Bilateral Distance:    Right Eye Near:   Left Eye Near:    Bilateral Near:     Physical Exam General appearance: alert, well developed, well nourished Head: Normocephalic, without obvious abnormality, atraumatic Respiratory: Respirations even and unlabored, normal respiratory rate Heart: rate and rhythm normal. No gallop or murmurs noted on exam  Extremities: No gross deformities Skin: Skin color, texture, turgor normal. No rashes seen  Psych: Appropriate mood and affect. Neurologic:  UC Treatments / Results  Labs (all labs ordered are listed, but only abnormal results are displayed) Labs Reviewed - No data to display  EKG   Radiology No results found.  Procedures Procedures (including critical care  time)  Medications Ordered in UC Medications - No data to display  Initial Impression / Assessment and Plan / UC Course  I have reviewed the triage vital signs and the nursing notes.  Pertinent labs & imaging results that were available during my care of the patient were reviewed by me and considered in my medical decision making (see chart for details).     Patient presents today with recurrent dental pain involving the lower left wisdom tooth.  She has some mild facial swelling however no concern today for periorbital cellulitis.  Patient is afebrile.  Will cover today with amoxicillin also advised to discontinue ibuprofen prescribed Naprosyn.  Gave dental resources to patient.  Patient will follow up. Final Clinical Impressions(s) / UC Diagnoses   Final diagnoses:  Dental infection     Discharge Instructions     [facility]  [address], [place], [place] [address] Phone: [phone] Clinic [email or web address]     ED Prescriptions    Medication Sig Dispense Auth. Provider   amoxicillin (AMOXIL) 875 MG tablet Take 1 tablet (875 mg total) by mouth 2 (two) times daily. 20 tablet Scot Jun, FNP   naproxen (NAPROSYN) 500 MG tablet Take 1 tablet (500 mg total) by mouth 2 (two) times daily. 90 tablet Scot Jun, FNP     PDMP not reviewed this encounter.   Scot Jun, FNP 02/15/21 2001

## 2021-02-15 NOTE — Discharge Instructions (Addendum)
[facility]  [place], [place], [place] [address] Phone: [phone] Clinic [email or web address]

## 2021-03-23 ENCOUNTER — Other Ambulatory Visit: Payer: Self-pay | Admitting: Family Medicine

## 2021-04-15 ENCOUNTER — Emergency Department (HOSPITAL_COMMUNITY): Payer: Self-pay

## 2021-04-15 ENCOUNTER — Emergency Department (HOSPITAL_COMMUNITY)
Admission: EM | Admit: 2021-04-15 | Discharge: 2021-04-16 | Disposition: A | Discharge status: Home / Self Care | Payer: Self-pay | Attending: Emergency Medicine | Admitting: Emergency Medicine

## 2021-04-15 DIAGNOSIS — T1490XA Injury, unspecified, initial encounter: Secondary | ICD-10-CM

## 2021-04-15 DIAGNOSIS — Z20822 Contact with and (suspected) exposure to covid-19: Secondary | ICD-10-CM | POA: Insufficient documentation

## 2021-04-15 DIAGNOSIS — Z87891 Personal history of nicotine dependence: Secondary | ICD-10-CM | POA: Insufficient documentation

## 2021-04-15 DIAGNOSIS — W3400XA Accidental discharge from unspecified firearms or gun, initial encounter: Secondary | ICD-10-CM | POA: Insufficient documentation

## 2021-04-15 DIAGNOSIS — Z23 Encounter for immunization: Secondary | ICD-10-CM | POA: Insufficient documentation

## 2021-04-15 DIAGNOSIS — S31010A Laceration without foreign body of lower back and pelvis without penetration into retroperitoneum, initial encounter: Secondary | ICD-10-CM | POA: Insufficient documentation

## 2021-04-15 LAB — URINALYSIS, ROUTINE W REFLEX MICROSCOPIC
Bacteria, UA: NONE SEEN
Bilirubin Urine: NEGATIVE
Glucose, UA: NEGATIVE mg/dL
Ketones, ur: 5 mg/dL — AB
Leukocytes,Ua: NEGATIVE
Nitrite: NEGATIVE
Protein, ur: 30 mg/dL — AB
Specific Gravity, Urine: 1.013 (ref 1.005–1.030)
pH: 5 (ref 5.0–8.0)

## 2021-04-15 LAB — COMPREHENSIVE METABOLIC PANEL
ALT: 13 U/L (ref 0–44)
AST: 22 U/L (ref 15–41)
Albumin: 3.5 g/dL (ref 3.5–5.0)
Alkaline Phosphatase: 75 U/L (ref 38–126)
Anion gap: 9 (ref 5–15)
BUN: 10 mg/dL (ref 6–20)
CO2: 22 mmol/L (ref 22–32)
Calcium: 8.7 mg/dL — ABNORMAL LOW (ref 8.9–10.3)
Chloride: 105 mmol/L (ref 98–111)
Creatinine, Ser: 0.79 mg/dL (ref 0.44–1.00)
GFR, Estimated: >60 mL/min (ref >60)
Glucose, Bld: 95 mg/dL (ref 70–99)
Potassium: 3.7 mmol/L (ref 3.5–5.1)
Sodium: 136 mmol/L (ref 135–145)
Total Bilirubin: 0.5 mg/dL (ref 0.3–1.2)
Total Protein: 7.9 g/dL (ref 6.5–8.1)

## 2021-04-15 LAB — RESP PANEL BY RT-PCR (FLU A&B, COVID) ARPGX2
Influenza A by PCR: NEGATIVE
Influenza B by PCR: NEGATIVE
SARS Coronavirus 2 by RT PCR: NEGATIVE

## 2021-04-15 LAB — CBC
HCT: 36.8 % (ref 36.0–46.0)
Hemoglobin: 11.4 g/dL — ABNORMAL LOW (ref 12.0–15.0)
MCH: 27.3 pg (ref 26.0–34.0)
MCHC: 31 g/dL (ref 30.0–36.0)
MCV: 88 fL (ref 80.0–100.0)
Platelets: 378 10*3/uL (ref 150–400)
RBC: 4.18 MIL/uL (ref 3.87–5.11)
RDW: 14.5 % (ref 11.5–15.5)
WBC: 11.3 10*3/uL — ABNORMAL HIGH (ref 4.0–10.5)
nRBC: 0 % (ref 0.0–0.2)

## 2021-04-15 LAB — LACTIC ACID, PLASMA: Lactic Acid, Venous: 1.8 mmol/L (ref 0.5–1.9)

## 2021-04-15 LAB — PROTIME-INR
INR: 1 (ref 0.8–1.2)
Prothrombin Time: 13.6 seconds (ref 11.4–15.2)

## 2021-04-15 LAB — SAMPLE TO BLOOD BANK

## 2021-04-15 LAB — ETHANOL: Alcohol, Ethyl (B): <10 mg/dL (ref <10)

## 2021-04-15 MED ORDER — IOHEXOL 300 MG/ML  SOLN
100.0000 mL | Freq: Once | INTRAMUSCULAR | Status: AC | PRN
  Administered 2021-04-15: 100 mL via INTRAVENOUS

## 2021-04-15 MED ORDER — LIDOCAINE HCL (PF) 1 % IJ SOLN
30.0000 mL | Freq: Once | INTRAMUSCULAR | Status: AC
  Administered 2021-04-15: 30 mL via INTRADERMAL
  Filled 2021-04-15: qty 30

## 2021-04-15 MED ORDER — SODIUM CHLORIDE 0.9 % IV BOLUS
500.0000 mL | Freq: Once | INTRAVENOUS | Status: AC
  Administered 2021-04-15: 500 mL via INTRAVENOUS

## 2021-04-15 MED ORDER — TETANUS-DIPHTH-ACELL PERTUSSIS 5-2.5-18.5 LF-MCG/0.5 IM SUSY
0.5000 mL | PREFILLED_SYRINGE | Freq: Once | INTRAMUSCULAR | Status: AC
  Administered 2021-04-15: 0.5 mL via INTRAMUSCULAR
  Filled 2021-04-15: qty 0.5

## 2021-04-15 MED ORDER — CEFAZOLIN SODIUM-DEXTROSE 2-4 GM/100ML-% IV SOLN
2.0000 g | Freq: Once | INTRAVENOUS | Status: AC
  Administered 2021-04-15: 2 g via INTRAVENOUS
  Filled 2021-04-15: qty 100

## 2021-04-15 MED ORDER — FENTANYL CITRATE (PF) 100 MCG/2ML IJ SOLN
50.0000 ug | Freq: Once | INTRAMUSCULAR | Status: AC
  Administered 2021-04-15: 50 ug via INTRAVENOUS
  Filled 2021-04-15: qty 2

## 2021-04-15 NOTE — ED Notes (Signed)
Patient transported to CT 

## 2021-04-15 NOTE — ED Notes (Signed)
Pt placed on bedpan
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
## 2021-04-15 NOTE — ED Notes (Signed)
Dr. Oswald Hillock at pt bedside for sutures

## 2021-04-15 NOTE — ED Notes (Signed)
RN spoke with CT tech about expected wait time for pt to go to CT for ordered scans. CT tech told this RN that pt is next in line for scans
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
## 2021-04-16 NOTE — ED Notes (Signed)
E-signature pad unavailable at time of pt discharge. This RN discussed discharge materials with pt and answered all pt questions. Pt stated understanding of discharge material. ? ?
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
## 2021-04-18 ENCOUNTER — Other Ambulatory Visit: Payer: Self-pay

## 2021-04-18 ENCOUNTER — Ambulatory Visit: Payer: Self-pay | Attending: Critical Care Medicine | Admitting: Critical Care Medicine

## 2021-04-18 ENCOUNTER — Encounter: Payer: Self-pay | Admitting: Critical Care Medicine

## 2021-04-18 VITALS — BP 127/83 | HR 89 | Resp 18 | Ht 70.0 in | Wt 225.8 lb

## 2021-04-18 DIAGNOSIS — J4520 Mild intermittent asthma, uncomplicated: Secondary | ICD-10-CM

## 2021-04-18 DIAGNOSIS — Z124 Encounter for screening for malignant neoplasm of cervix: Secondary | ICD-10-CM

## 2021-04-18 DIAGNOSIS — W3400XA Accidental discharge from unspecified firearms or gun, initial encounter: Secondary | ICD-10-CM | POA: Insufficient documentation

## 2021-04-18 DIAGNOSIS — Z1159 Encounter for screening for other viral diseases: Secondary | ICD-10-CM

## 2021-04-18 DIAGNOSIS — Z114 Encounter for screening for human immunodeficiency virus [HIV]: Secondary | ICD-10-CM

## 2021-04-18 HISTORY — DX: Accidental discharge from unspecified firearms or gun, initial encounter: W34.00XA

## 2021-04-18 MED ORDER — ALBUTEROL SULFATE 0.63 MG/3ML IN NEBU
1.0000 | INHALATION_SOLUTION | Freq: Four times a day (QID) | RESPIRATORY_TRACT | 0 refills | Status: DC | PRN
  Filled 2021-04-18: qty 75, 7d supply, fill #0

## 2021-04-18 MED ORDER — ALBUTEROL SULFATE HFA 108 (90 BASE) MCG/ACT IN AERS
1.0000 | INHALATION_SPRAY | Freq: Four times a day (QID) | RESPIRATORY_TRACT | 0 refills | Status: DC | PRN
  Filled 2021-04-18: qty 18, 25d supply, fill #0

## 2021-04-18 NOTE — Assessment & Plan Note (Signed)
Mild intermittent asthma patient no longer is smoking  Have refilled albuterol as needed

## 2021-04-18 NOTE — Assessment & Plan Note (Signed)
The wound continues to heal we will remove sutures next week 10 days into the incision being sewn up  No antibiotics indicated

## 2021-04-18 NOTE — Patient Instructions (Signed)
Refills on your albuterol inhaler and nebulizer sent to our pharmacy  Pick up a financial assistance packet on your way out to see if you qualify for orange card [place] discount  Blood today will be hepatitis C HIV screen we will call you with results  Referral to gynecology sent for Pap smear  Return to see Dr. [name] for primary care follow-up in 6 months and come next week on Tuesday for a wound check and suture removal here at the clinic with Dr. [name]

## 2021-04-19 ENCOUNTER — Other Ambulatory Visit: Payer: Self-pay

## 2021-04-20 ENCOUNTER — Other Ambulatory Visit: Payer: Self-pay

## 2021-04-25 ENCOUNTER — Other Ambulatory Visit: Payer: Self-pay

## 2021-04-25 ENCOUNTER — Encounter: Payer: Self-pay | Admitting: Critical Care Medicine

## 2021-04-25 ENCOUNTER — Ambulatory Visit: Payer: Self-pay | Attending: Critical Care Medicine | Admitting: Critical Care Medicine

## 2021-04-25 DIAGNOSIS — W3400XA Accidental discharge from unspecified firearms or gun, initial encounter: Secondary | ICD-10-CM

## 2021-04-25 DIAGNOSIS — S31010D Laceration without foreign body of lower back and pelvis without penetration into retroperitoneum, subsequent encounter: Secondary | ICD-10-CM

## 2021-04-25 MED ORDER — SULFAMETHOXAZOLE-TRIMETHOPRIM 800-160 MG PO TABS
1.0000 | ORAL_TABLET | Freq: Two times a day (BID) | ORAL | 0 refills | Status: DC
  Filled 2021-04-25: qty 14, 7d supply, fill #0
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
## 2021-04-25 NOTE — Patient Instructions (Signed)
Take bactrim DS twice daily for 7days  Return one week for wound check next Tuesday
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
## 2021-04-26 ENCOUNTER — Other Ambulatory Visit: Payer: Self-pay

## 2021-04-26 ENCOUNTER — Encounter (HOSPITAL_COMMUNITY): Payer: Self-pay

## 2021-04-26 ENCOUNTER — Ambulatory Visit: Payer: Self-pay | Admitting: *Deleted

## 2021-04-26 ENCOUNTER — Emergency Department (HOSPITAL_COMMUNITY)
Admission: EM | Admit: 2021-04-26 | Discharge: 2021-04-26 | Disposition: A | Discharge status: Home / Self Care | Payer: Self-pay | Attending: Emergency Medicine | Admitting: Emergency Medicine

## 2021-04-26 DIAGNOSIS — X58XXXA Exposure to other specified factors, initial encounter: Secondary | ICD-10-CM | POA: Insufficient documentation

## 2021-04-26 DIAGNOSIS — J4520 Mild intermittent asthma, uncomplicated: Secondary | ICD-10-CM | POA: Insufficient documentation

## 2021-04-26 DIAGNOSIS — W3400XA Accidental discharge from unspecified firearms or gun, initial encounter: Secondary | ICD-10-CM

## 2021-04-26 DIAGNOSIS — Z87891 Personal history of nicotine dependence: Secondary | ICD-10-CM | POA: Insufficient documentation

## 2021-04-26 DIAGNOSIS — T8130XA Disruption of wound, unspecified, initial encounter: Secondary | ICD-10-CM | POA: Insufficient documentation

## 2021-04-26 DIAGNOSIS — W3400XD Accidental discharge from unspecified firearms or gun, subsequent encounter: Secondary | ICD-10-CM | POA: Insufficient documentation
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
## 2021-04-26 NOTE — Discharge Instructions (Signed)
Continue the wet to dry dressings with saline soaked gauze, changing twice daily and monitoring the area for increasing redness, pain or drainage. Continue the bactrim as prescribed by your doctor.
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
## 2021-04-27 ENCOUNTER — Telehealth: Payer: Self-pay | Admitting: Critical Care Medicine
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
## 2021-05-02 ENCOUNTER — Encounter: Payer: Self-pay | Admitting: Critical Care Medicine

## 2021-05-02 ENCOUNTER — Other Ambulatory Visit: Payer: Self-pay

## 2021-05-02 ENCOUNTER — Ambulatory Visit: Payer: Self-pay | Attending: Critical Care Medicine | Admitting: Critical Care Medicine

## 2021-05-02 VITALS — BP 115/79 | HR 74 | Temp 98.2°F | Ht 70.0 in | Wt 224.0 lb

## 2021-05-02 DIAGNOSIS — J4520 Mild intermittent asthma, uncomplicated: Secondary | ICD-10-CM

## 2021-05-02 DIAGNOSIS — W3400XA Accidental discharge from unspecified firearms or gun, initial encounter: Secondary | ICD-10-CM

## 2021-05-02 MED ORDER — ALBUTEROL SULFATE (2.5 MG/3ML) 0.083% IN NEBU
2.5000 mg | INHALATION_SOLUTION | Freq: Four times a day (QID) | RESPIRATORY_TRACT | 1 refills | Status: DC | PRN
  Filled 2021-05-02: qty 150, 13d supply, fill #0

## 2021-05-02 NOTE — Assessment & Plan Note (Signed)
Wound slowly improving redressed we will see again in 1 week no additional antibiotics needed
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
## 2021-05-02 NOTE — Assessment & Plan Note (Signed)
Refill albuterol. 

## 2021-05-02 NOTE — Patient Instructions (Signed)
Wound looks good   Return for recheck after 2pm on Tuesday May 23rd

## 2021-05-09 ENCOUNTER — Other Ambulatory Visit: Payer: Self-pay

## 2021-05-09 ENCOUNTER — Ambulatory Visit: Payer: Self-pay | Attending: Critical Care Medicine | Admitting: Critical Care Medicine

## 2021-05-09 ENCOUNTER — Encounter: Payer: Self-pay | Admitting: Critical Care Medicine

## 2021-05-09 DIAGNOSIS — S31000D Unspecified open wound of lower back and pelvis without penetration into retroperitoneum, subsequent encounter: Secondary | ICD-10-CM

## 2021-05-09 DIAGNOSIS — W3400XA Accidental discharge from unspecified firearms or gun, initial encounter: Secondary | ICD-10-CM

## 2021-05-09 MED ORDER — SULFAMETHOXAZOLE-TRIMETHOPRIM 800-160 MG PO TABS
1.0000 | ORAL_TABLET | Freq: Two times a day (BID) | ORAL | 0 refills | Status: DC
  Filled 2021-05-09: qty 14, 7d supply, fill #0

## 2021-05-09 NOTE — Assessment & Plan Note (Signed)
The wound is not healing as planned  I will refer to the wound care center for further evaluation  Plan further 7-day course of Bactrim double strength 1 twice daily

## 2021-05-09 NOTE — Patient Instructions (Signed)
A referral to wound clinic will be made at [facility]  Take bactrim twice a day for 7 more days   Return to Dr [name] for primary care in two months
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
## 2021-05-31 ENCOUNTER — Telehealth: Payer: Self-pay | Admitting: Critical Care Medicine

## 2021-05-31 NOTE — Telephone Encounter (Signed)
Copied from [place] [id number]. Topic: General - Inquiry >> May 30, 2021 11:48 AM Greggory Keen D wrote: Reason for CRM: Pt called saying she has an appt at the wound center on Thursday and she wants to know if the wound has closed up does she still need to go th that appt.  CB#  [phone]

## 2021-05-31 NOTE — Telephone Encounter (Signed)
Will route to PCP for review. 

## 2021-05-31 NOTE — Telephone Encounter (Signed)
Yes, pls keep wound clinic appt.   To be sure no complications occurred

## 2021-06-01 ENCOUNTER — Other Ambulatory Visit: Payer: Self-pay

## 2021-06-01 ENCOUNTER — Encounter (HOSPITAL_BASED_OUTPATIENT_CLINIC_OR_DEPARTMENT_OTHER): Payer: Self-pay | Attending: Internal Medicine | Admitting: Internal Medicine

## 2021-06-01 DIAGNOSIS — L98429 Non-pressure chronic ulcer of back with unspecified severity: Secondary | ICD-10-CM | POA: Insufficient documentation

## 2021-06-01 NOTE — Progress Notes (Signed)
Alicia Morton ([id number]) Visit Report for 06/01/2021 Abuse/Suicide Risk Screen Details Patient Name: Date of Service: [facility], [place] Alicia Morton Medical Record Number: [id number] Patient Account Number: [id number] Date of Birth/Sex: Treating RN: 1981/11/08 (40 y.o. Female) Lorrin Jackson Primary Care Dinita Migliaccio: Asencion Noble Other Clinician: Referring Chantry Headen: Treating Daysia Vandenboom/Extender: Maxwell Marion in Treatment: 0 Abuse/Suicide Risk Screen Items Answer ABUSE RISK SCREEN: Has anyone close to you tried to hurt or harm you recentlyo No Do you feel uncomfortable with anyone in your familyo No Has anyone forced you do things that you didnt want to doo No Electronic Signature(s) Signed: 06/01/2021 6:07:06 PM By: Lorrin Jackson Entered By: Lorrin Jackson on 06/01/2021 07:55:27 -------------------------------------------------------------------------------- Activities of Daily Living Details Patient Name: Date of Service: [facility], [place] Alicia Morton Medical Record Number: [id number] Patient Account Number: [id number] Date of Birth/Sex: Treating RN: [birth year] (40 y.o. Female) Lorrin Jackson Primary Care Renly Guedes: Asencion Noble Other Clinician: Referring Kinberly Perris: Treating Aryaman Haliburton/Extender: Maxwell Marion in Treatment: 0 Activities of Daily Living Items Answer Activities of Daily Living (Please select one for each item) Drive Automobile Completely Able T Medications ake Completely Able Use T elephone Completely Able Care for Appearance Completely Able Use T oilet Completely Able Bath / Shower Completely Able Dress Self Completely Able Feed Self Completely Able Walk Completely Able Get In / Out Bed Completely Able Housework Completely Able Prepare Meals Completely Able Handle Money Completely Able Shop for Self Completely Able Electronic Signature(s) Signed: 06/01/2021 6:07:06 PM By: Lorrin Jackson Entered By: Lorrin Jackson on 06/01/2021 07:55:46 -------------------------------------------------------------------------------- Education Screening Details Patient Name: Date of Service: [facility] Alicia [facility] Medical Record Number: [id number] Patient Account Number: [id number] Date of Birth/Sex: Treating RN: 1981/07/09 (40 y.o. Female) Lorrin Jackson Primary Care Nia Nathaniel: Asencion Noble Other Clinician: Referring Lavance Beazer: Treating Lestine Rahe/Extender: Maxwell Marion in Treatment: 0 Primary Learner Assessed: Patient Learning Preferences/Education Level/Primary Language Learning Preference: Explanation, Demonstration, Printed Material Highest Education Level: High School Preferred Language: English Cognitive Barrier Language Barrier: No Translator Needed: No Memory Deficit: No Emotional Barrier: No Cultural/Religious Beliefs Affecting Medical Care: No Physical Barrier Impaired Vision: No Impaired Hearing: No Decreased Hand dexterity: No Knowledge/Comprehension Knowledge Level: High Comprehension Level: High Ability to understand written instructions: High Ability to understand verbal instructions: High Motivation Anxiety Level: Calm Cooperation: Cooperative Education Importance: Acknowledges Need Interest in Health Problems: Asks Questions Perception: Coherent Willingness to Engage in Self-Management High Activities: Readiness to Engage in Self-Management High Activities: Electronic Signature(s) Signed: 06/01/2021 6:07:06 PM By: Lorrin Jackson Entered By: Lorrin Jackson on 06/01/2021 07:56:10 -------------------------------------------------------------------------------- Fall Risk Assessment Details Patient Name: Date of Service: [facility] Alicia Morton Medical Record Number: [id number] Patient Account Number: [id number] Date of Birth/Sex: Treating RN: [birth year] (40 y.o. Female) Lorrin Jackson Primary  Care Selita Staiger: Asencion Noble Other Clinician: Referring Shanetra Blumenstock: Treating Mckinnley Cottier/Extender: Maxwell Marion in Treatment: 0 Fall Risk Assessment Items Have you had 2 or more falls in the last 12 monthso 0 No Have you had any fall that resulted in injury in the last 12 monthso 0 No FALLS RISK SCREEN History of falling - immediate or within 3 months 0 No Secondary diagnosis (Do you have 2 or more medical diagnoseso) 0 No Ambulatory aid None/bed rest/wheelchair/nurse 0 Yes Crutches/cane/walker 0 No Furniture 0 No Intravenous therapy Access/Saline/Heparin Lock 0 No Gait/Transferring Normal/ bed rest/ wheelchair 0 Yes Weak (short steps  with or without shuffle, stooped but able to lift head while walking, may seek 0 No support from furniture) Impaired (short steps with shuffle, may have difficulty arising from chair, head down, impaired 0 No balance) Mental Status Oriented to own ability 0 Yes Electronic Signature(s) Signed: 06/01/2021 6:07:06 PM By: Lorrin Jackson Entered By: Lorrin Jackson on 06/01/2021 07:56:24 -------------------------------------------------------------------------------- Foot Assessment Details Patient Name: Date of Service: [facility] Alic[facility] Medical Record Number: [id number] Patient Account Number: [id number] Date of Birth/Sex: Treating RN: 01-Aug-1981 (40 y.o. Female) Lorrin Jackson Primary Care Mahari Strahm: Asencion Noble Other Clinician: Referring Tannia Contino: Treating Marlon Suleiman/Extender: Maxwell Marion in Treatment: 0 Foot Assessment Items Site Locations + = Sensation present, - = Sensation absent, C = Callus, U = Ulcer R = Redness, W = Warmth, Morton = Maceration, PU = Pre-ulcerative lesion F = Fissure, S = Swelling, D = Dryness Assessment Right: Left: Other Deformity: No No Prior Foot Ulcer: No No Prior Amputation: No No Charcot Joint: No No Ambulatory Status: Ambulatory Without  Help Gait: Steady Notes N/A- back wound Electronic Signature(s) Signed: 06/01/2021 6:07:06 PM By: Lorrin Jackson Entered By: Lorrin Jackson on 06/01/2021 07:57:22 -------------------------------------------------------------------------------- Nutrition Risk Screening Details Patient Name: Date of Service: [facility] Alicia 0/88/1103 1:59 A Morton Medical Record Number: [id number] Patient Account Number: [id number] Date of Birth/Sex: Treating RN: [birth year] (40 y.o. Female) Lorrin Jackson Primary Care Coretta Leisey: Asencion Noble Other Clinician: Referring Shrita Thien: Treating Kahlyn Shippey/Extender: Maxwell Marion in Treatment: 0 Height (in): Weight (lbs): Body Mass Index (BMI): Nutrition Risk Screening Items Score Screening NUTRITION RISK SCREEN: I have an illness or condition that made me change the kind and/or amount of food I eat 0 No I eat fewer than two meals per day 0 No I eat few fruits and vegetables, or milk products 0 No I have three or more drinks of beer, liquor or wine almost every day 0 No I have tooth or mouth problems that make it hard for me to eat 0 No I don't always have enough money to buy the food I need 0 No I eat alone most of the time 0 No I take three or more different prescribed or over-the-Morton drugs a day 0 No Without wanting to, I have lost or gained 10 pounds in the last six months 0 No I am not always physically able to shop, cook and/or feed myself 0 No Nutrition Protocols Good Risk Protocol 0 No interventions needed Moderate Risk Protocol High Risk Proctocol Risk Level: Good Risk Score: 0 Electronic Signature(s) Signed: 06/01/2021 6:07:06 PM By: Lorrin Jackson Entered By: Lorrin Jackson on 06/01/2021 07:57:05
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
## 2021-06-02 NOTE — Progress Notes (Signed)
Alicia, Morton ([id number]) Visit Report for 06/01/2021 Allergy List Details Patient Name: Date of Service: Alicia Morton Alicia Morton, Alicia Morton 3/73/4287 6:81 A M Medical Record Number: [id number] Patient Account Number: [id number] Date of Birth/Sex: Treating RN: [birth year] (40 y.o. Female) Lorrin Jackson Primary Care Ervey Fallin: Asencion Noble Other Clinician: Referring Juniper Cobey: Treating Margurette Brener/Extender: Maxwell Marion in Treatment: 0 Allergies Active Allergies No Known Allergies Allergy Notes Electronic Signature(s) Signed: 06/01/2021 6:07:06 PM By: Lorrin Jackson Entered By: Lorrin Jackson on 06/01/2021 07:52:02 -------------------------------------------------------------------------------- Arrival Information Details Patient Name: Date of Service: Alicia Morton Alicia Morton, Alicia Morton 5/97/4163 8:45 A M Medical Record Number: [id number] Patient Account Number: [id number] Date of Birth/Sex: Treating RN: 1981-04-12 (40 y.o. Female) Deon Pilling Primary Care Yumiko Alkins: Asencion Noble Other Clinician: Referring Ermias Tomeo: Treating Shahil Speegle/Extender: Maxwell Marion in Treatment: 0 Visit Information Patient Arrived: Ambulatory Arrival Time: 07:49 Accompanied By: wife Transfer Assistance: None Patient Identification Verified: Yes Secondary Verification Process Completed: Yes Electronic Signature(s) Signed: 06/01/2021 10:52:16 AM By: Sandre Kitty Entered By: Sandre Kitty on 06/01/2021 07:49:25 -------------------------------------------------------------------------------- Clinic Level of Care Assessment Details Patient Name: Date of Service: GLO Clayton Bibles Alicia Morton, Alicia Morton 02/09/8249 0:37 A M Medical Record Number: [id number] Patient Account Number: [id number] Date of Birth/Sex: Treating RN: [birth year] (40 y.o. Female) Deon Pilling Primary Care Lathaniel Legate: Asencion Noble Other Clinician: Referring Lasonya Hubner: Treating Tarynn Garling/Extender: Maxwell Marion in Treatment: 0 Clinic Level of Care Assessment Items TOOL 1 Quantity Score X- 1 0 Use when EandM and Procedure is performed on INITIAL visit ASSESSMENTS - Nursing Assessment / Reassessment X- 1 20 General Physical Exam (combine w/ comprehensive assessment (listed just below) when performed on new pt. evals) X- 1 25 Comprehensive Assessment (HX, ROS, Risk Assessments, Wounds Hx, etc.) ASSESSMENTS - Wound and Skin Assessment / Reassessment X- 1 10 Dermatologic / Skin Assessment (not related to wound area) ASSESSMENTS - Ostomy and/or Continence Assessment and Care []  - 0 Incontinence Assessment and Management []  - 0 Ostomy Care Assessment and Management (repouching, etc.) PROCESS - Coordination of Care X - Simple Patient / Family Education for ongoing care 1 15 []  - 0 Complex (extensive) Patient / Family Education for ongoing care X- 1 10 Staff obtains Programmer, systems, Records, T Results / Process Orders est []  - 0 Staff telephones HHA, Nursing Homes / Clarify orders / etc []  - 0 Routine Transfer to another Facility (non-emergent condition) []  - 0 Routine Hospital Admission (non-emergent condition) X- 1 15 New Admissions / Biomedical engineer / Ordering NPWT Apligraf, etc. , []  - 0 Emergency Hospital Admission (emergent condition) PROCESS - Special Needs []  - 0 Pediatric / Minor Patient Management []  - 0 Isolation Patient Management []  - 0 Hearing / Language / Visual special needs []  - 0 Assessment of Community assistance (transportation, D/C planning, etc.) []  - 0 Additional assistance / Altered mentation []  - 0 Support Surface(s) Assessment (bed, cushion, seat, etc.) INTERVENTIONS - Miscellaneous []  - 0 External ear exam []  - 0 Patient Transfer (multiple staff / Civil Service fast streamer / Similar devices) []  - 0 Simple Staple / Suture removal (25 or less) []  - 0 Complex Staple / Suture removal (26 or more) []  - 0 Hypo/Hyperglycemic Management (do not check if  billed separately) []  - 0 Ankle / Brachial Index (ABI) - do not check if billed separately Has the patient been seen at the hospital within the last three years: Yes Total Score: 95 Level Of Care: New/Established - Level 3 Electronic Signature(s) Signed: 06/01/2021 5:31:56 PM By:  Deaton, Bobbi Entered By: Deon Pilling on 06/01/2021 08:22:49 -------------------------------------------------------------------------------- Encounter Discharge Information Details Patient Name: Date of Service: GLO Clayton Bibles Alicia Morton, Alicia Morton 5/69/7948 0:16 A M Medical Record Number: [id number] Patient Account Number: [id number] Date of Birth/Sex: Treating RN: 10-21-1981 (40 y.o. Female) Deon Pilling Primary Care Nyasiah Moffet: Asencion Noble Other Clinician: Referring Caitlen Worth: Treating Sway Guttierrez/Extender: Maxwell Marion in Treatment: 0 Encounter Discharge Information Items Post Procedure Vitals Discharge Condition: Stable Temperature (F): 98.2 Ambulatory Status: Ambulatory Pulse (bpm): 66 Discharge Destination: Home Respiratory Rate (breaths/min): 18 Transportation: Private Auto Blood Pressure (mmHg): 109/70 Accompanied By: wife Schedule Follow-up Appointment: Yes Clinical Summary of Care: Electronic Signature(s) Signed: 06/01/2021 5:31:56 PM By: Deon Pilling Entered By: Deon Pilling on 06/01/2021 09:24:22 -------------------------------------------------------------------------------- Lower Extremity Assessment Details Patient Name: Date of Service: GLO Clayton Bibles Alicia Morton, Alicia Morton 7/86/7544 9:20 A M Medical Record Number: [id number] Patient Account Number: [id number] Date of Birth/Sex: Treating RN: [birth year] (40 y.o. Female) Lorrin Jackson Primary Care Jhoana Upham: Asencion Noble Other Clinician: Referring Linley Moskal: Treating Christin Mccreedy/Extender: Maxwell Marion in Treatment: 0 Notes N/A: Back Wound Electronic Signature(s) Signed: 06/01/2021 6:07:06 PM By: Lorrin Jackson Entered By: Lorrin Jackson on 06/01/2021 07:57:37 -------------------------------------------------------------------------------- Multi Wound Chart Details Patient Name: Date of Service: Alicia Morton Alicia Morton, Alicia Morton 5/88/3254 9:82 A M Medical Record Number: [id number] Patient Account Number: [id number] Date of Birth/Sex: Treating RN: 01/21/1981 (40 y.o. Female) Deon Pilling Primary Care Quynh Basso: Asencion Noble Other Clinician: Referring Nilaya Bouie: Treating Liem Copenhaver/Extender: Maxwell Marion in Treatment: 0 Vital Signs Height(in): Pulse(bpm): 70 Weight(lbs): Blood Pressure(mmHg): 109/70 Body Mass Index(BMI): Temperature(F): 98.2 Respiratory Rate(breaths/min): 18 Photos: Photos: [1:No Photos Back] [N/A:N/A N/A] Wound Location: [1:Surgical Injury] [N/A:N/A] Wounding Event: [1:Dehisced Wound] [N/A:N/A] Primary Etiology: [1:Anemia] [N/A:N/A] Comorbid History: [1:04/15/2021] [N/A:N/A] Date Acquired: [1:0] [N/A:N/A] Weeks of Treatment: [1:Open] [N/A:N/A] Wound Status: [1:0.5x3.2x0.1] [N/A:N/A] Measurements L x W x D (cm) [1:1.257] [N/A:N/A] A (cm) : rea [1:0.126] [N/A:N/A] Volume (cm) : [1:Full Thickness Without Exposed] [N/A:N/A] Classification: [1:Support Structures Medium] [N/A:N/A] Exudate A mount: [1:Serosanguineous] [N/A:N/A] Exudate Type: [1:red, brown] [N/A:N/A] Exudate Color: [1:Distinct, outline attached] [N/A:N/A] Wound Margin: [1:Medium (34-66%)] [N/A:N/A] Granulation A mount: [1:Pink] [N/A:N/A] Granulation Quality: [1:Small (1-33%)] [N/A:N/A] Necrotic A mount: [1:Eschar] [N/A:N/A] Necrotic Tissue: [1:Fat Layer (Subcutaneous Tissue): Yes N/A] Exposed Structures: [1:Fascia: No Tendon: No Muscle: No Joint: No Bone: No Medium (34-66%)] [N/A:N/A] Epithelialization: [1:Debridement - Selective/Open Wound N/A] Debridement: Pre-procedure Verification/Time Out 08:15 [N/A:N/A] Taken: [1:Other] [N/A:N/A] Pain Control: [1:Necrotic/Eschar, Slough]  [N/A:N/A] Tissue Debrided: [1:Non-Viable Tissue] [N/A:N/A] Level: [1:1.5] [N/A:N/A] Debridement A (sq cm): [1:rea Curette] [N/A:N/A] Instrument: [1:Minimum] [N/A:N/A] Bleeding: [1:Pressure] [N/A:N/A] Hemostasis A chieved: [1:0] [N/A:N/A] Procedural Pain: [1:2] [N/A:N/A] Post Procedural Pain: [1:Procedure was tolerated well] [N/A:N/A] Debridement Treatment Response: [1:0.5x1.5x0.5] [N/A:N/A] Post Debridement Measurements L x W x D (cm) [1:0.295] [N/A:N/A] Post Debridement Volume: (cm) [1:Debridement] [N/A:N/A] Treatment Notes Electronic Signature(s) Signed: 06/01/2021 5:31:56 PM By: Deon Pilling Signed: 06/02/2021 10:33:35 AM By: Linton Ham MD Entered By: Linton Ham on 06/01/2021 07:68:08 -------------------------------------------------------------------------------- Multi-Disciplinary Care Plan Details Patient Name: Date of Service: Alicia Morton Alicia Morton, Alicia Morton 07/27/314 9:45 A M Medical Record Number: [id number] Patient Account Number: [id number] Date of Birth/Sex: Treating RN: 01-Jun-1981 (40 y.o. Female) Deon Pilling Primary Care Tayonna Bacha: Asencion Noble Other Clinician: Referring Carmella Kees: Treating Taeja Debellis/Extender: Maxwell Marion in Treatment: 0 Active Inactive Orientation to the Wound Care Program Nursing Diagnoses: Knowledge deficit related to the wound healing center program Goals: Patient/caregiver will verbalize understanding of the [facility] Date Initiated: 06/01/2021  Target Resolution Date: 06/23/2021 Goal Status: Active Interventions: Provide education on orientation to the wound center Notes: Pain, Acute or Chronic Nursing Diagnoses: Pain, acute or chronic: actual or potential Potential alteration in comfort, pain Goals: Patient will verbalize adequate pain control and receive pain control interventions during procedures as needed Date Initiated: 06/01/2021 Target Resolution Date: 06/23/2021 Goal Status:  Active Patient/caregiver will verbalize comfort level met Date Initiated: 06/01/2021 Target Resolution Date: 06/22/2021 Goal Status: Active Interventions: Encourage patient to take pain medications as prescribed Provide education on pain management Reposition patient for comfort Treatment Activities: Administer pain control measures as ordered : 06/01/2021 Notes: Wound/Skin Impairment Nursing Diagnoses: Knowledge deficit related to ulceration/compromised skin integrity Goals: Patient/caregiver will verbalize understanding of skin care regimen Date Initiated: 06/01/2021 Target Resolution Date: 06/23/2021 Goal Status: Active Interventions: Assess patient/caregiver ability to obtain necessary supplies Assess patient/caregiver ability to perform ulcer/skin care regimen upon admission and as needed Provide education on ulcer and skin care Treatment Activities: Skin care regimen initiated : 06/01/2021 Topical wound management initiated : 06/01/2021 Notes: Electronic Signature(s) Signed: 06/01/2021 5:31:56 PM By: Deon Pilling Entered By: Deon Pilling on 06/01/2021 08:13:11 -------------------------------------------------------------------------------- Pain Assessment Details Patient Name: Date of Service: Alicia Morton Alicia Morton, Alicia Morton 8/84/1660 6:30 A M Medical Record Number: [id number] Patient Account Number: [id number] Date of Birth/Sex: Treating RN: Jun 08, 1981 (40 y.o. Female) Lorrin Jackson Primary Care Shronda Boeh: Asencion Noble Other Clinician: Referring Tyeasha Ebbs: Treating Holger Sokolowski/Extender: Maxwell Marion in Treatment: 0 Active Problems Location of Pain Severity and Description of Pain Patient Has Paino No Site Locations Pain Management and Medication Current Pain Management: Electronic Signature(s) Signed: 06/01/2021 6:07:06 PM By: Lorrin Jackson Entered By: Lorrin Jackson on 06/01/2021  08:00:16 -------------------------------------------------------------------------------- Patient/Caregiver Education Details Patient Name: Date of Service: Alicia Morton Alicia Morton, Alicia Morton 5/57/3220URKYHCW2:37 A M Medical Record Number: [id number] Patient Account Number: [id number] Date of Birth/Gender: Treating RN: 12/04/1981 (40 y.o. Female) Deon Pilling Primary Care Physician: Asencion Noble Other Clinician: Referring Physician: Treating Physician/Extender: Maxwell Marion in Treatment: 0 Education Assessment Education Provided To: Patient Education Topics Provided Pain: Handouts: A Guide to Pain Control Methods: Explain/Verbal, Printed Responses: Reinforcements needed [place]: o Handouts: Welcome T [place] o Methods: Explain/Verbal, Printed Responses: Reinforcements needed Electronic Signature(s) Signed: 06/01/2021 5:31:56 PM By: Deon Pilling Entered By: Deon Pilling on 06/01/2021 08:14:32 -------------------------------------------------------------------------------- Wound Assessment Details Patient Name: Date of Service: GLO Clayton Bibles Alicia Morton, Alicia Morton 1/60/7371 0:62 A M Medical Record Number: [id number] Patient Account Number: [id number] Date of Birth/Sex: Treating RN: [birth year] (40 y.o. Female) Lorrin Jackson Primary Care Kenston Longton: Asencion Noble Other Clinician: Referring Virgin Zellers: Treating Storm Sovine/Extender: Maxwell Marion in Treatment: 0 Wound Status Wound Number: 1 Primary Etiology: Dehisced Wound Wound Location: Back Wound Status: Open Wounding Event: Surgical Injury Comorbid History: Anemia Date Acquired: 04/15/2021 Weeks Of Treatment: 0 Clustered Wound: No Photos Wound Measurements Length: (cm) 0.5 Width: (cm) 3.2 Depth: (cm) 0.1 Area: (cm) 1.257 Volume: (cm) 0.126 % Reduction in Area: 0% % Reduction in Volume: 0% Epithelialization: Medium (34-66%) Tunneling: No Undermining:  No Wound Description Classification: Full Thickness Without Exposed Support Structures Wound Margin: Distinct, outline attached Exudate Amount: Medium Exudate Type: Serosanguineous Exudate Color: red, brown Foul Odor After Cleansing: No Slough/Fibrino No Wound Bed Granulation Amount: Medium (34-66%) Exposed Structure Granulation Quality: Pink Fascia Exposed: No Necrotic Amount: Small (1-33%) Fat Layer (Subcutaneous Tissue) Exposed: Yes Necrotic Quality: Eschar Tendon Exposed: No Muscle Exposed: No Joint Exposed: No Bone Exposed: No  Treatment Notes Wound #1 (Back) Cleanser Wound Cleanser Discharge Instruction: Cleanse the wound with wound cleanser prior to applying a clean dressing using gauze sponges, not tissue or cotton balls. Peri-Wound Care Topical Primary Dressing Promogran Prisma Matrix, 4.34 (sq in) (silver collagen) Discharge Instruction: Moisten collagen with saline or hydrogel- [place] Jelly Secondary Dressing ABD Pad, 5x9 Discharge Instruction: Apply over primary dressing as directed. Secured With 63M Medipore H Soft Cloth Surgical T 4 x 2 (in/yd) ape Discharge Instruction: Secure dressing with tape as directed. Compression Wrap Compression Stockings Add-Ons Electronic Signature(s) Signed: 06/01/2021 5:21:02 PM By: Sandre Kitty Signed: 06/01/2021 6:07:06 PM By: Lorrin Jackson Entered By: Sandre Kitty on 06/01/2021 16:41:32 -------------------------------------------------------------------------------- Vitals Details Patient Name: Date of Service: Alicia Morton Alicia Morton, Alicia Morton 9/90/9400 0:50 A M Medical Record Number: [id number] Patient Account Number: [id number] Date of Birth/Sex: Treating RN: [birth year] (39 y.o. Female) Deon Pilling Primary Care Nayan Proch: Asencion Noble Other Clinician: Referring Coda Mathey: Treating Javar Eshbach/Extender: Maxwell Marion in Treatment: 0 Vital Signs Time Taken: 07:49 Temperature (F): 98.2 Pulse (bpm):  66 Respiratory Rate (breaths/min): 18 Blood Pressure (mmHg): 109/70 Reference Range: 80 - 120 mg / dl Electronic Signature(s) Signed: 06/01/2021 10:52:16 AM By: Sandre Kitty Entered By: Sandre Kitty on 06/01/2021 07:49:42

## 2021-06-08 ENCOUNTER — Other Ambulatory Visit: Payer: Self-pay

## 2021-06-08 ENCOUNTER — Encounter (HOSPITAL_BASED_OUTPATIENT_CLINIC_OR_DEPARTMENT_OTHER): Payer: Self-pay | Admitting: Internal Medicine

## 2021-06-08 NOTE — Progress Notes (Addendum)
Alicia Morton ([id number]) Visit Report for 06/08/2021 Arrival Information Details Patient Name: Date of Service: [facility], [place] [name] 3/81/0175 1:02 PM Medical Record Number: [id number] Patient Account Number: [id number] Date of Birth/Sex: Treating RN: [birth year] (40 y.o. [name], [name] Primary Care Sandip Power: Asencion Noble Other Clinician: Referring Jaira Canady: Treating Liam Bossman/Extender: Maxwell Marion in Treatment: 1 Visit Information History Since Last Visit Added or deleted any medications: No Patient Arrived: Ambulatory Any new allergies or adverse reactions: No Arrival Time: 13:18 Had a fall or experienced change in No Accompanied By: wife activities of daily living that may affect Transfer Assistance: None risk of falls: Patient Identification Verified: Yes Signs or symptoms of abuse/neglect since last visito No Secondary Verification Process Completed: Yes Hospitalized since last visit: No Implantable device outside of the clinic excluding No cellular tissue based products placed in the center since last visit: Has Dressing in Place as Prescribed: Yes Pain Present Now: No Electronic Signature(s) Signed: 06/08/2021 4:12:25 PM By: Sandre Kitty Entered By: Sandre Kitty on 06/08/2021 13:19:04 -------------------------------------------------------------------------------- Clinic Level of Care Assessment Details Patient Name: Date of Service: [facility] [name] 2/35/3614 4:31 PM Medical Record Number: [id number] Patient Account Number: [id number] Date of Birth/Sex: Treating RN: [birth year] (40 y.o. [name], [name] Primary Care Joelee Snoke: Asencion Noble Other Clinician: Referring Ashla Murph: Treating Quy Lotts/Extender: Maxwell Marion in Treatment: 1 Clinic Level of Care Assessment Items TOOL 4 Quantity Score X- 1 0 Use when only an EandM is performed on FOLLOW-UP visit ASSESSMENTS - Nursing Assessment /  Reassessment X- 1 10 Reassessment of Co-morbidities (includes updates in patient status) X- 1 5 Reassessment of Adherence to Treatment Plan ASSESSMENTS - Wound and Skin A ssessment / Reassessment X - Simple Wound Assessment / Reassessment - one wound 1 5 []  - 0 Complex Wound Assessment / Reassessment - multiple wounds X- 1 10 Dermatologic / Skin Assessment (not related to wound area) ASSESSMENTS - Focused Assessment []  - 0 Circumferential Edema Measurements - multi extremities X- 1 10 Nutritional Assessment / Counseling / Intervention []  - 0 Lower Extremity Assessment (monofilament, tuning fork, pulses) []  - 0 Peripheral Arterial Disease Assessment (using hand held doppler) ASSESSMENTS - Ostomy and/or Continence Assessment and Care []  - 0 Incontinence Assessment and Management []  - 0 Ostomy Care Assessment and Management (repouching, etc.) PROCESS - Coordination of Care X - Simple Patient / Family Education for ongoing care 1 15 []  - 0 Complex (extensive) Patient / Family Education for ongoing care X- 1 10 Staff obtains Programmer, systems, Records, T Results / Process Orders est []  - 0 Staff telephones HHA, Nursing Homes / Clarify orders / etc []  - 0 Routine Transfer to another Facility (non-emergent condition) []  - 0 Routine Hospital Admission (non-emergent condition) []  - 0 New Admissions / Biomedical engineer / Ordering NPWT Apligraf, etc. , []  - 0 Emergency Hospital Admission (emergent condition) X- 1 10 Simple Discharge Coordination []  - 0 Complex (extensive) Discharge Coordination PROCESS - Special Needs []  - 0 Pediatric / Minor Patient Management []  - 0 Isolation Patient Management []  - 0 Hearing / Language / Visual special needs []  - 0 Assessment of Community assistance (transportation, D/C planning, etc.) []  - 0 Additional assistance / Altered mentation []  - 0 Support Surface(s) Assessment (bed, cushion, seat, etc.) INTERVENTIONS - Wound Cleansing /  Measurement X - Simple Wound Cleansing - one wound 1 5 []  - 0 Complex Wound Cleansing - multiple wounds X- 1 5 Wound Imaging (photographs - any number of  wounds) '[]'$  - 0 Wound Tracing (instead of photographs) X- 1 5 Simple Wound Measurement - one wound $RemoveB'[]'OOaUskdD$  - 0 Complex Wound Measurement - multiple wounds INTERVENTIONS - Wound Dressings X - Small Wound Dressing one or multiple wounds 1 10 $Re'[]'nfO$  - 0 Medium Wound Dressing one or multiple wounds $RemoveBeforeD'[]'XmiCAgvNWuEuYy$  - 0 Large Wound Dressing one or multiple wounds $RemoveBeforeD'[]'wMqCKwcuSoblKa$  - 0 Application of Medications - topical $RemoveB'[]'nRfpyzjF$  - 0 Application of Medications - injection INTERVENTIONS - Miscellaneous $RemoveBeforeD'[]'WayoqquvxqzEOa$  - 0 External ear exam $Remove'[]'SuCXeVY$  - 0 Specimen Collection (cultures, biopsies, blood, body fluids, etc.) $RemoveBefor'[]'VyYiAMnUoyRV$  - 0 Specimen(s) / Culture(s) sent or taken to Lab for analysis $RemoveBefo'[]'SilBqGmieRX$  - 0 Patient Transfer (multiple staff / Civil Service fast streamer / Similar devices) $RemoveBeforeDE'[]'qSqVjRxmDKJqMIL$  - 0 Simple Staple / Suture removal (25 or less) $Remove'[]'enhRhME$  - 0 Complex Staple / Suture removal (26 or more) $Remove'[]'PbsQPor$  - 0 Hypo / Hyperglycemic Management (close monitor of Blood Glucose) $RemoveBefore'[]'qYPqNpGuPfOXL$  - 0 Ankle / Brachial Index (ABI) - do not check if billed separately X- 1 5 Vital Signs Has the patient been seen at the hospital within the last three years: Yes Total Score: 105 Level Of Care: New/Established - Level 3 Electronic Signature(s) Signed: 06/08/2021 5:46:50 PM By: Deon Pilling Entered By: Deon Pilling on 06/08/2021 13:56:28 -------------------------------------------------------------------------------- Encounter Discharge Information Details Patient Name: Date of Service: [facility] [name] 0/21/1173 5:67 PM Medical Record Number: [id number] Patient Account Number: [id number] Date of Birth/Sex: Treating RN: Nov 15, 1981 (40 y.o. [name], [name] Primary Care Nicha Hemann: Asencion Noble Other Clinician: Referring Nithya Meriweather: Treating Adrianna Dudas/Extender: Maxwell Marion in Treatment: 1 Encounter Discharge Information  Items Discharge Condition: Stable Ambulatory Status: Ambulatory Discharge Destination: Home Transportation: Private Auto Accompanied By: wife Schedule Follow-up Appointment: Yes Clinical Summary of Care: Electronic Signature(s) Signed: 06/08/2021 5:46:50 PM By: Deon Pilling Entered By: Deon Pilling on 06/08/2021 13:56:04 -------------------------------------------------------------------------------- Multi Wound Chart Details Patient Name: Date of Service: [facility] [place] [facility] [name] 1/43/8887 5:79 PM Medical Record Number: [id number] Patient Account Number: [id number] Date of Birth/Sex: Treating RN: 03-20-1981 (40 y.o. [name], [name] Primary Care Melvina Pangelinan: Asencion Noble Other Clinician: Referring Evanne Matsunaga: Treating Woods Gangemi/Extender: Maxwell Marion in Treatment: 1 Vital Signs Height(in): Pulse(bpm): 20 Weight(lbs): Blood Pressure(mmHg): 103/69 Body Mass Index(BMI): Temperature(F): 98.1 Respiratory Rate(breaths/min): 18 Photos: [1:No Photos Back] [N/A:N/A N/A] Wound Location: [1:Surgical Injury] [N/A:N/A] Wounding Event: [1:Dehisced Wound] [N/A:N/A] Primary Etiology: [1:Anemia] [N/A:N/A] Comorbid History: [1:04/15/2021] [N/A:N/A] Date Acquired: [1:1] [N/A:N/A] Weeks of Treatment: [1:Open] [N/A:N/A] Wound Status: [1:0.1x0.4x0.3] [N/A:N/A] Measurements L x W x D (cm) [1:0.031] [N/A:N/A] A (cm) : rea [1:0.009] [N/A:N/A] Volume (cm) : [1:97.50%] [N/A:N/A] % Reduction in Area: [1:92.90%] [N/A:N/A] % Reduction in Volume: [1:Full Thickness Without Exposed] [N/A:N/A] Classification: [1:Support Structures Medium] [N/A:N/A] Exudate Amount: [1:Serosanguineous] [N/A:N/A] Exudate Type: [1:red, brown] [N/A:N/A] Exudate Color: [1:Distinct, outline attached] [N/A:N/A] Wound Margin: [1:Large (67-100%)] [N/A:N/A] Granulation Amount: [1:Pink] [N/A:N/A] Granulation Quality: [1:None Present (0%)] [N/A:N/A] Necrotic Amount: [1:Fat Layer (Subcutaneous Tissue): Yes  N/A] Exposed Structures: [1:Fascia: No Tendon: No Muscle: No Joint: No Bone: No Medium (34-66%)] [N/A:N/A] Treatment Notes Electronic Signature(s) Signed: 06/08/2021 5:20:02 PM By: Linton Ham MD Signed: 06/08/2021 5:46:50 PM By: Deon Pilling Entered By: Linton Ham on 06/08/2021 13:45:52 -------------------------------------------------------------------------------- Multi-Disciplinary Care Plan Details Patient Name: Date of Service: [facility] [name] 05/31/3793 3:27 PM Medical Record Number: [id number] Patient Account Number: [id number] Date of Birth/Sex: Treating RN: 11-Aug-1981 (40 y.o. [name], [name] Primary Care Larson Limones: Asencion Noble Other Clinician: Referring Hlee Fringer: Treating Tiernan Suto/Extender: Kathrene Alu  Weeks in Treatment: 1 Active Inactive Pain, Acute or Chronic Nursing Diagnoses: Pain, acute or chronic: actual or potential Potential alteration in comfort, pain Goals: Patient will verbalize adequate pain control and receive pain control interventions during procedures as needed Date Initiated: 06/01/2021 Target Resolution Date: 06/23/2021 Goal Status: Active Patient/caregiver will verbalize comfort level met Date Initiated: 06/01/2021 Target Resolution Date: 06/22/2021 Goal Status: Active Interventions: Encourage patient to take pain medications as prescribed Provide education on pain management Reposition patient for comfort Treatment Activities: Administer pain control measures as ordered : 06/01/2021 Notes: Wound/Skin Impairment Nursing Diagnoses: Knowledge deficit related to ulceration/compromised skin integrity Goals: Patient/caregiver will verbalize understanding of skin care regimen Date Initiated: 06/01/2021 Target Resolution Date: 06/23/2021 Goal Status: Active Interventions: Assess patient/caregiver ability to obtain necessary supplies Assess patient/caregiver ability to perform ulcer/skin care regimen upon admission and  as needed Provide education on ulcer and skin care Treatment Activities: Skin care regimen initiated : 06/01/2021 Topical wound management initiated : 06/01/2021 Notes: Electronic Signature(s) Signed: 06/08/2021 5:46:50 PM By: Deon Pilling Entered By: Deon Pilling on 06/08/2021 13:36:01 -------------------------------------------------------------------------------- Pain Assessment Details Patient Name: Date of Service: [facility] [name] 1/77/9390 3:00 PM Medical Record Number: [id number] Patient Account Number: [id number] Date of Birth/Sex: Treating RN: 23-Jul-1981 (40 y.o. [name], [name] Primary Care Wassim Kirksey: Asencion Noble Other Clinician: Referring Navie Lamoreaux: Treating Paetyn Pietrzak/Extender: Maxwell Marion in Treatment: 1 Active Problems Location of Pain Severity and Description of Pain Patient Has Paino No Site Locations Pain Management and Medication Current Pain Management: Electronic Signature(s) Signed: 06/08/2021 4:12:25 PM By: Sandre Kitty Signed: 06/08/2021 5:46:50 PM By: Deon Pilling Entered By: Sandre Kitty on 06/08/2021 13:19:23 -------------------------------------------------------------------------------- Patient/Caregiver Education Details Patient Name: Date of Service: [facility] [name] 2/63/3354TGYBWLS9:37 PM Medical Record Number: [id number] Patient Account Number: [id number] Date of Birth/Gender: Treating RN: November 13, 1981 (40 y.o. [name], [name] Primary Care Physician: Asencion Noble Other Clinician: Referring Physician: Treating Physician/Extender: Maxwell Marion in Treatment: 1 Education Assessment Education Provided To: Patient Education Topics Provided Wound/Skin Impairment: Handouts: Skin Care Do's and Dont's Methods: Explain/Verbal Responses: Reinforcements needed Electronic Signature(s) Signed: 06/08/2021 5:46:50 PM By: Deon Pilling Entered By: Deon Pilling on 06/08/2021  13:37:59 -------------------------------------------------------------------------------- Wound Assessment Details Patient Name: Date of Service: [facility] [name] 5/72/6203 5:59 PM Medical Record Number: [id number] Patient Account Number: [id number] Date of Birth/Sex: Treating RN: May 08, 1981 (40 y.o. [name], [name] Primary Care Chonita Gadea: Asencion Noble Other Clinician: Referring Juanpablo Ciresi: Treating Cameron Schwinn/Extender: Maxwell Marion in Treatment: 1 Wound Status Wound Number: 1 Primary Etiology: Dehisced Wound Wound Location: Back Wound Status: Open Wounding Event: Surgical Injury Comorbid History: Anemia Date Acquired: 04/15/2021 Weeks Of Treatment: 1 Clustered Wound: No Photos Wound Measurements Length: (cm) 0.1 Width: (cm) 0.4 Depth: (cm) 0.3 Area: (cm) 0.031 Volume: (cm) 0.009 % Reduction in Area: 97.5% % Reduction in Volume: 92.9% Epithelialization: Medium (34-66%) Tunneling: No Undermining: No Wound Description Classification: Full Thickness Without Exposed Support Structures Wound Margin: Distinct, outline attached Exudate Amount: Medium Exudate Type: Serosanguineous Exudate Color: red, brown Foul Odor After Cleansing: No Slough/Fibrino No Wound Bed Granulation Amount: Large (67-100%) Exposed Structure Granulation Quality: Pink Fascia Exposed: No Necrotic Amount: None Present (0%) Fat Layer (Subcutaneous Tissue) Exposed: Yes Tendon Exposed: No Muscle Exposed: No Joint Exposed: No Bone Exposed: No Treatment Notes Wound #1 (Back) Cleanser Wound Cleanser Discharge Instruction: Cleanse the wound with wound cleanser prior to applying a clean dressing using gauze sponges, not tissue or cotton balls. Peri-Wound  Care Topical Primary Dressing Promogran Prisma Matrix, 4.34 (sq in) (silver collagen) Discharge Instruction: Moisten collagen with saline or hydrogel- [place] Jelly Secondary Dressing Zetuvit Plus Silicone Border Dressing  4x4 (in/in) Discharge Instruction: Apply silicone border over primary dressing as directed. Secured With Compression Wrap Compression Stockings Environmental education officer) Signed: 06/09/2021 12:34:28 PM By: Sandre Kitty Signed: 06/09/2021 6:02:43 PM By: Deon Pilling Previous Signature: 06/08/2021 5:46:50 PM Version By: Deon Pilling Entered By: Sandre Kitty on 06/09/2021 12:17:36 -------------------------------------------------------------------------------- Vitals Details Patient Name: Date of Service: [facility] [name] 5/64/3329 5:18 PM Medical Record Number: [id number] Patient Account Number: [id number] Date of Birth/Sex: Treating RN: 05/08/1981 (40 y.o. [name] Primary Care Janiyla Long: Asencion Noble Other Clinician: Referring Nandan Willems: Treating Maeghan Canny/Extender: Maxwell Marion in Treatment: 1 Vital Signs Time Taken: 13:19 Temperature (F): 98.1 Pulse (bpm): 69 Respiratory Rate (breaths/min): 18 Blood Pressure (mmHg): 103/69 Reference Range: 80 - 120 mg / dl Electronic Signature(s) Signed: 06/08/2021 4:12:25 PM By: Sandre Kitty Entered By: Sandre Kitty on 06/08/2021 13:19:18
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
## 2021-06-14 ENCOUNTER — Encounter (HOSPITAL_BASED_OUTPATIENT_CLINIC_OR_DEPARTMENT_OTHER): Payer: Self-pay | Admitting: Internal Medicine

## 2021-06-14 ENCOUNTER — Other Ambulatory Visit: Payer: Self-pay
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
## 2021-06-14 NOTE — Progress Notes (Addendum)
NANCE, MCCOMBS ([id number]) Visit Report for 06/14/2021 Arrival Information Details Patient Name: Date of Service: [facility], [place] [name] 02/29/1760 6:07 PM Medical Record Number: [id number] Patient Account Number: [id number] Date of Birth/Sex: Treating RN: 10-Jul-1981 (40 y.o. Tonita Phoenix, Alicia Morton Primary Care Jerzy Roepke: Asencion Noble Other Clinician: Referring Robinson Brinkley: Treating Samanthan Dugo/Extender: Maxwell Marion in Treatment: 1 Visit Information History Since Last Visit Added or deleted any medications: No Patient Arrived: Ambulatory Any new allergies or adverse reactions: No Arrival Time: 16:07 Had a fall or experienced change in No Accompanied By: wife activities of daily living that may affect Transfer Assistance: None risk of falls: Patient Identification Verified: Yes Signs or symptoms of abuse/neglect since last visito No Secondary Verification Process Completed: Yes Hospitalized since last visit: No Patient Requires Transmission-Based Precautions: No Implantable device outside of the clinic excluding No Patient Has Alerts: No cellular tissue based products placed in the center since last visit: Has Dressing in Place as Prescribed: Yes Pain Present Now: No Electronic Signature(s) Signed: 06/14/2021 6:02:58 PM By: Rhae Hammock RN Entered By: Rhae Hammock on 06/14/2021 16:11:46 -------------------------------------------------------------------------------- Clinic Level of Care Assessment Details Patient Name: Date of Service: [facility] [name] 8/54/6270 3:50 PM Medical Record Number: [id number] Patient Account Number: [id number] Date of Birth/Sex: Treating RN: 1981/06/07 (40 y.o. Alicia Morton Primary Care Daneya Hartgrove: Asencion Noble Other Clinician: Referring Scarleth Brame: Treating Wrigley Winborne/Extender: Maxwell Marion in Treatment: 1 Clinic Level of Care Assessment Items TOOL 4 Quantity Score X- 1 0 Use when only an  EandM is performed on FOLLOW-UP visit ASSESSMENTS - Nursing Assessment / Reassessment X- 1 10 Reassessment of Co-morbidities (includes updates in patient status) X- 1 5 Reassessment of Adherence to Treatment Plan ASSESSMENTS - Wound and Skin A ssessment / Reassessment X - Simple Wound Assessment / Reassessment - one wound 1 5 []  - 0 Complex Wound Assessment / Reassessment - multiple wounds []  - 0 Dermatologic / Skin Assessment (not related to wound area) ASSESSMENTS - Focused Assessment []  - 0 Circumferential Edema Measurements - multi extremities []  - 0 Nutritional Assessment / Counseling / Intervention []  - 0 Lower Extremity Assessment (monofilament, tuning fork, pulses) []  - 0 Peripheral Arterial Disease Assessment (using hand held doppler) ASSESSMENTS - Ostomy and/or Continence Assessment and Care []  - 0 Incontinence Assessment and Management []  - 0 Ostomy Care Assessment and Management (repouching, etc.) PROCESS - Coordination of Care X - Simple Patient / Family Education for ongoing care 1 15 []  - 0 Complex (extensive) Patient / Family Education for ongoing care X- 1 10 Staff obtains Programmer, systems, Records, T Results / Process Orders est []  - 0 Staff telephones HHA, Nursing Homes / Clarify orders / etc []  - 0 Routine Transfer to another Facility (non-emergent condition) []  - 0 Routine Hospital Admission (non-emergent condition) []  - 0 New Admissions / Biomedical engineer / Ordering NPWT Apligraf, etc. , []  - 0 Emergency Hospital Admission (emergent condition) X- 1 10 Simple Discharge Coordination []  - 0 Complex (extensive) Discharge Coordination PROCESS - Special Needs []  - 0 Pediatric / Minor Patient Management []  - 0 Isolation Patient Management []  - 0 Hearing / Language / Visual special needs []  - 0 Assessment of Community assistance (transportation, D/C planning, etc.) []  - 0 Additional assistance / Altered mentation []  - 0 Support Surface(s)  Assessment (bed, cushion, seat, etc.) INTERVENTIONS - Wound Cleansing / Measurement X - Simple Wound Cleansing - one wound 1 5 []  - 0 Complex Wound Cleansing - multiple wounds  X- 1 5 Wound Imaging (photographs - any number of wounds) []  - 0 Wound Tracing (instead of photographs) X- 1 5 Simple Wound Measurement - one wound []  - 0 Complex Wound Measurement - multiple wounds INTERVENTIONS - Wound Dressings []  - 0 Small Wound Dressing one or multiple wounds []  - 0 Medium Wound Dressing one or multiple wounds []  - 0 Large Wound Dressing one or multiple wounds []  - 0 Application of Medications - topical []  - 0 Application of Medications - injection INTERVENTIONS - Miscellaneous []  - 0 External ear exam []  - 0 Specimen Collection (cultures, biopsies, blood, body fluids, etc.) []  - 0 Specimen(s) / Culture(s) sent or taken to Lab for analysis []  - 0 Patient Transfer (multiple staff / Civil Service fast streamer / Similar devices) []  - 0 Simple Staple / Suture removal (25 or less) []  - 0 Complex Staple / Suture removal (26 or more) []  - 0 Hypo / Hyperglycemic Management (close monitor of Blood Glucose) []  - 0 Ankle / Brachial Index (ABI) - do not check if billed separately X- 1 5 Vital Signs Has the patient been seen at the hospital within the last three years: Yes Total Score: 75 Level Of Care: New/Established - Level 2 Electronic Signature(s) Signed: 06/14/2021 4:56:05 PM By: Levan Hurst RN, BSN Entered By: Levan Hurst on 06/14/2021 16:54:37 -------------------------------------------------------------------------------- Encounter Discharge Information Details Patient Name: Date of Service: [facility] [name] 0/27/2536 6:44 PM Medical Record Number: [id number] Patient Account Number: [id number] Date of Birth/Sex: Treating RN: 1980/12/20 (40 y.o. Alicia Morton Primary Care Cynara Tatham: Asencion Noble Other Clinician: Referring Serene Kopf: Treating Shinichi Anguiano/Extender: Maxwell Marion in Treatment: 1 Encounter Discharge Information Items Discharge Condition: Stable Ambulatory Status: Ambulatory Discharge Destination: Home Transportation: Private Auto Accompanied By: spouse Schedule Follow-up Appointment: Yes Clinical Summary of Care: Patient Declined Electronic Signature(s) Signed: 06/14/2021 4:56:05 PM By: Levan Hurst RN, BSN Entered By: Levan Hurst on 06/14/2021 16:55:07 -------------------------------------------------------------------------------- Lower Extremity Assessment Details Patient Name: Date of Service: GLO [facility] [name] 6/38/7564 3:32 PM Medical Record Number: [id number] Patient Account Number: [id number] Date of Birth/Sex: Treating RN: 02-Mar-1981 (40 y.o. Tonita Phoenix, Alicia Morton Primary Care Tameshia Bonneville: Asencion Noble Other Clinician: Referring Salem Mastrogiovanni: Treating Haynes Giannotti/Extender: Maxwell Marion in Treatment: 1 Electronic Signature(s) Signed: 06/14/2021 6:02:58 PM By: Rhae Hammock RN Entered By: Rhae Hammock on 06/14/2021 16:12:20 -------------------------------------------------------------------------------- Multi Wound Chart Details Patient Name: Date of Service: [facility] [place] [facility] [name] 0/63/0160 1:09 PM Medical Record Number: [id number] Patient Account Number: [id number] Date of Birth/Sex: Treating RN: Mar 17, 1981 (40 y.o. Alicia Morton Primary Care Marriana Hibberd: Asencion Noble Other Clinician: Referring Anjali Manzella: Treating Halden Phegley/Extender: Maxwell Marion in Treatment: 1 Vital Signs Height(in): Pulse(bpm): 13 Weight(lbs): Blood Pressure(mmHg): 108/70 Body Mass Index(BMI): Temperature(F): 98.8 Respiratory Rate(breaths/min): 17 Photos: [N/A:N/A] Back N/A N/A Wound Location: Surgical Injury N/A N/A Wounding Event: Dehisced Wound N/A N/A Primary Etiology: Anemia N/A N/A Comorbid History: 04/15/2021 N/A N/A Date Acquired: 1 N/A N/A Weeks of  Treatment: Open N/A N/A Wound Status: 0x0x0 N/A N/A Measurements L x W x D (cm) 0 N/A N/A A (cm) : rea 0 N/A N/A Volume (cm) : 100.00% N/A N/A % Reduction in A rea: 100.00% N/A N/A % Reduction in Volume: Full Thickness Without Exposed N/A N/A Classification: Support Structures Medium N/A N/A Exudate Amount: Serosanguineous N/A N/A Exudate Type: red, brown N/A N/A Exudate Color: Distinct, outline attached N/A N/A Wound Margin: Large (67-100%) N/A N/A Granulation Amount: Pink N/A N/A Granulation  Quality: None Present (0%) N/A N/A Necrotic Amount: Fat Layer (Subcutaneous Tissue): Yes N/A N/A Exposed Structures: Fascia: No Tendon: No Muscle: No Joint: No Bone: No Medium (34-66%) N/A N/A Epithelialization: Treatment Notes Electronic Signature(s) Signed: 06/14/2021 4:56:05 PM By: Levan Hurst RN, BSN Signed: 06/14/2021 4:58:19 PM By: Linton Ham MD Entered By: Linton Ham on 06/14/2021 16:47:53 -------------------------------------------------------------------------------- Multi-Disciplinary Care Plan Details Patient Name: Date of Service: [facility] [name] 5/32/9924 2:68 PM Medical Record Number: [id number] Patient Account Number: [id number] Date of Birth/Sex: Treating RN: 05/02/1981 (40 y.o. Alicia Morton Primary Care Adasha Boehme: Asencion Noble Other Clinician: Referring Novah Goza: Treating Sophina Mitten/Extender: Maxwell Marion in Treatment: 1 Active Inactive Electronic Signature(s) Signed: 06/14/2021 4:56:05 PM By: Levan Hurst RN, BSN Entered By: Levan Hurst on 06/14/2021 16:54:05 -------------------------------------------------------------------------------- Pain Assessment Details Patient Name: Date of Service: [facility] [place] [facility] [name] 7/98/9211 9:41 PM Medical Record Number: [id number] Patient Account Number: [id number] Date of Birth/Sex: Treating RN: April 02, 1981 (40 y.o. Tonita Phoenix, Alicia Morton Primary Care Rhesa Forsberg:  Asencion Noble Other Clinician: Referring Kaula Klenke: Treating Nelani Schmelzle/Extender: Maxwell Marion in Treatment: 1 Active Problems Location of Pain Severity and Description of Pain Patient Has Paino No Site Locations Pain Management and Medication Current Pain Management: Electronic Signature(s) Signed: 06/14/2021 6:02:58 PM By: Rhae Hammock RN Entered By: Rhae Hammock on 06/14/2021 16:12:13 -------------------------------------------------------------------------------- Patient/Caregiver Education Details Patient Name: Date of Service: [facility] [name] 8/56/3149FWYOVZC5:88 PM Medical Record Number: [id number] Patient Account Number: [id number] Date of Birth/Gender: Treating RN: [birth year] (40 y.o. Alicia Morton Primary Care Physician: Asencion Noble Other Clinician: Referring Physician: Treating Physician/Extender: Maxwell Marion in Treatment: 1 Education Assessment Education Provided To: Patient Education Topics Provided Wound/Skin Impairment: Methods: Explain/Verbal Responses: State content correctly Electronic Signature(s) Signed: 06/14/2021 4:56:05 PM By: Levan Hurst RN, BSN Entered By: Levan Hurst on 06/14/2021 16:54:15 -------------------------------------------------------------------------------- Wound Assessment Details Patient Name: Date of Service: [facility] [name] 7/86/7672 0:94 PM Medical Record Number: [id number] Patient Account Number: [id number] Date of Birth/Sex: Treating RN: 07-Jul-1981 (40 y.o. Tonita Phoenix, Alicia Morton Primary Care Adarryl Goldammer: Asencion Noble Other Clinician: Referring Niels Cranshaw: Treating Tenna Lacko/Extender: Maxwell Marion in Treatment: 1 Wound Status Wound Number: 1 Primary Etiology: Dehisced Wound Wound Location: Back Wound Status: Healed - Epithelialized Wounding Event: Surgical Injury Comorbid History: Anemia Date Acquired: 04/15/2021 Weeks  Of Treatment: 1 Clustered Wound: No Photos Wound Measurements Length: (cm) Width: (cm) Depth: (cm) Area: (cm) Volume: (cm) 0 % Reduction in Area: 100% 0 % Reduction in Volume: 100% 0 Epithelialization: Large (67-100%) 0 0 Wound Description Classification: Full Thickness Without Exposed Support Structures Wound Margin: Distinct, outline attached Exudate Amount: None Present Foul Odor After Cleansing: No Slough/Fibrino No Wound Bed Granulation Amount: None Present (0%) Exposed Structure Necrotic Amount: None Present (0%) Fascia Exposed: No Fat Layer (Subcutaneous Tissue) Exposed: No Tendon Exposed: No Muscle Exposed: No Joint Exposed: No Bone Exposed: No Electronic Signature(s) Signed: 09/14/2021 4:51:55 PM By: Levan Hurst RN, BSN Signed: 01/04/2022 12:41:35 PM By: Rhae Hammock RN Previous Signature: 06/14/2021 5:02:46 PM Version By: Sandre Kitty Previous Signature: 06/14/2021 6:02:58 PM Version By: Rhae Hammock RN Entered By: Levan Hurst on 07/18/2021 12:00:48 -------------------------------------------------------------------------------- [place] Details Patient Name: Date of Service: [facility] [name] 2/94/7654 6:50 PM Medical Record Number: [id number] Patient Account Number: [id number] Date of Birth/Sex: Treating RN: [birth year] (40 y.o. Tonita Phoenix, Alicia Morton Primary Care Sharman Garrott: Asencion Noble Other Clinician: Referring Shaquilla Kehres: Treating Krisanne Lich/Extender: Maxwell Marion in Treatment:  1 Vital Signs Time Taken: 16:11 Temperature (F): 98.8 Pulse (bpm): 76 Respiratory Rate (breaths/min): 17 Blood Pressure (mmHg): 108/70 Reference Range: 80 - 120 mg / dl Electronic Signature(s) Signed: 06/14/2021 6:02:58 PM By: Rhae Hammock RN Entered By: Rhae Hammock on 06/14/2021 16:12:06

## 2021-08-07 ENCOUNTER — Telehealth: Payer: Self-pay | Admitting: *Deleted

## 2021-08-07 DIAGNOSIS — Z1231 Encounter for screening mammogram for malignant neoplasm of breast: Secondary | ICD-10-CM

## 2021-08-07 NOTE — Telephone Encounter (Signed)
Copied from [facility] [id number]. Topic: Referral - Request for Referral >> Aug 07, 2021  3:33 PM Yvette Rack wrote: Has patient seen PCP for this complaint? Yes.   *If NO, is insurance requiring patient see PCP for this issue before PCP can refer them? Referral for which specialty: mammogram Preferred provider/office: no specific location Reason for referral: Pt request referral for mammogram

## 2021-08-08 NOTE — Telephone Encounter (Signed)
Pt needing referral for mammogram.

## 2021-08-08 NOTE — Addendum Note (Signed)
Addended by: Asencion Noble E on: 08/08/2021 11:50 AM   Modules accepted: Orders

## 2021-08-08 NOTE — Telephone Encounter (Signed)
She is uninsured.   I put mammo order in and she will need to call the scholarship fund to register for free mammogram

## 2021-08-10 ENCOUNTER — Telehealth: Payer: Self-pay

## 2021-08-10 NOTE — Telephone Encounter (Signed)
Pt has been called and given number to schedule her scan.

## 2021-08-10 NOTE — Telephone Encounter (Signed)
Patient telephoned requesting B[facility] appointment. After interviewing patient, patient ineligible for BCCCP program per income guidelines.

## 2021-08-24 ENCOUNTER — Telehealth (INDEPENDENT_AMBULATORY_CARE_PROVIDER_SITE_OTHER): Payer: Self-pay | Admitting: Critical Care Medicine

## 2021-08-24 DIAGNOSIS — Z124 Encounter for screening for malignant neoplasm of cervix: Secondary | ICD-10-CM

## 2021-08-24 NOTE — Telephone Encounter (Signed)
Copied from [place] [id number]. Topic: Referral - Request for Referral >> Aug 22, 2021 11:57 AM Yvette Rack wrote: Has patient seen PCP for this complaint? Yes.   *If NO, is insurance requiring patient see PCP for this issue before PCP can refer them? Referral for which specialty: Gynecology  Preferred provider/office: Gynecology off of [facility]  Reason for referral: pt requests referral to Gynecology

## 2021-08-24 NOTE — Telephone Encounter (Signed)
Done

## 2021-09-03 ENCOUNTER — Emergency Department (HOSPITAL_COMMUNITY)
Admission: EM | Admit: 2021-09-03 | Discharge: 2021-09-03 | Disposition: A | Discharge status: Home / Self Care | Payer: Medicaid Other | Attending: Emergency Medicine | Admitting: Emergency Medicine

## 2021-09-03 ENCOUNTER — Other Ambulatory Visit: Payer: Self-pay

## 2021-09-03 DIAGNOSIS — R590 Localized enlarged lymph nodes: Secondary | ICD-10-CM | POA: Diagnosis not present

## 2021-09-03 DIAGNOSIS — Z87891 Personal history of nicotine dependence: Secondary | ICD-10-CM | POA: Diagnosis not present

## 2021-09-03 DIAGNOSIS — N6321 Unspecified lump in the left breast, upper outer quadrant: Secondary | ICD-10-CM | POA: Diagnosis not present

## 2021-09-03 DIAGNOSIS — J4520 Mild intermittent asthma, uncomplicated: Secondary | ICD-10-CM | POA: Diagnosis not present

## 2021-09-03 DIAGNOSIS — N63 Unspecified lump in unspecified breast: Secondary | ICD-10-CM
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
## 2021-09-03 NOTE — Discharge Instructions (Addendum)
You came to the emergency department today to have your left breast mass examined.  Your mass is concerning.  You will need to follow-up with the breast clinic.  I have contacted your primary care provider to refer you to this clinic.  Please call his office tomorrow morning to make sure referral process has been started.

## 2021-09-03 NOTE — ED Triage Notes (Signed)
Pt states she noticed a lump in her left breast and left armpit about 1 month ago. Reports they have gotten bigger in size and have become painful.

## 2021-09-04 ENCOUNTER — Telehealth: Payer: Self-pay | Admitting: Critical Care Medicine

## 2021-09-04 DIAGNOSIS — N6320 Unspecified lump in the left breast, unspecified quadrant: Secondary | ICD-10-CM | POA: Insufficient documentation

## 2021-09-04 DIAGNOSIS — R2232 Localized swelling, mass and lump, left upper limb: Secondary | ICD-10-CM | POA: Insufficient documentation

## 2021-09-04 NOTE — Telephone Encounter (Signed)
Tried to call pt,  no answer, no VM option  Pt with left breast mass,  needs bilateral diagnostic mammogram of breast and biopsy with also axilla imaging

## 2021-09-04 NOTE — Telephone Encounter (Signed)
Tried to contact pt  no answer and no VM option

## 2021-09-05 ENCOUNTER — Telehealth: Payer: Self-pay | Admitting: Critical Care Medicine

## 2021-09-05 NOTE — Telephone Encounter (Signed)
I spoke to the patient and she knows about plans at breast imaging center and will be on lookout for a phone call

## 2021-09-19 ENCOUNTER — Telehealth: Payer: Self-pay

## 2021-09-19 NOTE — Telephone Encounter (Signed)
Patient walked in office, stated she had a left breast mass, left axillary lump, and pain x 2.5 months. She states the pain is getting worse. Patient states her nipple is starting to invert, skin feels hard in the outer left breast, and the ED physician told her that her breast underneath had some redness, dimpling. Patient denies a family history of breast cancer. Patient states she has contacted [facility], received no return call. Patient was informed that she did speak with our office in 03/2021, was ineligible as her income exceeded the federal guidelines for our program. Patient stated the income was incorrect, she and her wife's combined income is $38,262. Patient was then informed per Etheleen Sia, RN, needs to show proof of income, and if meets the guidelines, we will schedule an appointment for B[facility]CP. Patient verbalized understanding, stated she will bring proof of income to office.

## 2021-09-25 ENCOUNTER — Other Ambulatory Visit: Payer: Self-pay

## 2021-09-25 DIAGNOSIS — R2232 Localized swelling, mass and lump, left upper limb: Secondary | ICD-10-CM

## 2021-09-25 DIAGNOSIS — N6320 Unspecified lump in the left breast, unspecified quadrant: Secondary | ICD-10-CM

## 2021-09-26 ENCOUNTER — Ambulatory Visit: Payer: Self-pay | Admitting: *Deleted

## 2021-09-26 ENCOUNTER — Other Ambulatory Visit: Payer: Self-pay

## 2021-09-26 VITALS — BP 118/74 | Wt 232.9 lb

## 2021-09-26 DIAGNOSIS — Z1239 Encounter for other screening for malignant neoplasm of breast: Secondary | ICD-10-CM

## 2021-09-26 DIAGNOSIS — N644 Mastodynia: Secondary | ICD-10-CM

## 2021-09-26 DIAGNOSIS — N6325 Unspecified lump in the left breast, overlapping quadrants: Secondary | ICD-10-CM

## 2021-09-26 NOTE — Patient Instructions (Signed)
Explained breast self awareness Alicia Morton. Pap is due today. Patient refused Pap smear today. Patient scheduled to come to the free cervical cancer screening on Wednesday, November 15, 2021 at 1145. Let her know [facility] will cover Pap smears every 3 years unless has a history of abnormal Pap smears. Referred patient to the [facility] for a diagnostic mammogram. Appointment scheduled Thursday, September 28, 2021 at 1400. Patient aware of appointments and will be there. Alicia Morton verbalized understanding.  Elease Swarm, Arvil Chaco, RN 9:42 AM

## 2021-09-26 NOTE — Progress Notes (Signed)
Ms. Alicia Morton is a 40 y.o. female who presents to [facility] clinic today with complaint of left breast lump and pain x 2 months. Patient states the lump continues to increase in size. Patient states the pain comes and goes. Patient rates the pain at a 8 out of 10. Patient stated she has noticed that her left nipple has become inverted over the past month.    Pap Smear: Pap smear not completed today. Last Pap smear was over 5 years ago at the [facility] clinic and was normal per patient. Per patient has no history of an abnormal Pap smear. Last Pap smear result is not available in Epic.   Physical exam: Breasts Left breast larger than right breast that per patient has been a change over the past two months. No skin abnormalities right breast. Left breast skin has the peau d'orange appearance. No nipple retraction right breast. Left nipple slightly inverted that per patient has been a change over the past month. No nipple discharge bilateral breasts. No lymphadenopathy right axilla. Two lumps palpated right axilla at 1 o'clock 13 cm from the nipple and 1:30 o'clock 12 cm from the nipple. No lumps palpated right breast. Palpated a left breast mass between 11 o'clock and 6 o'clock that expanded across the whole outer breast and under nipple area. Complaints of diffuse left breast pain on exam.      Pelvic/Bimanual Pap is due today. Patient refused Pap smear today. Patient scheduled to come to the free cervical cancer screening on Wednesday, November 15, 2021 at 1145.   Smoking History: Patient is a former smoker that quit in 2011 and quit vaping in 2019.   Patient Navigation: Patient education provided. Access to services provided for patient through [facility] program.   Breast and Cervical Cancer Risk Assessment: Patient does not have family history of breast cancer, known genetic mutations, or radiation treatment to the chest before age 33. Patient does not have history of  cervical dysplasia, immunocompromised, or DES exposure in-utero.  Risk Assessment     Risk Scores       09/26/2021   Last edited by: Demetrius Revel, LPN   5-year risk: 0.7 %   Lifetime risk: 9.5 %            A: [facility]CP exam without pap smear Complaint of left breast lump, pain, and nipple inversion.  P: Referred patient to the [facility] for a diagnostic mammogram. Appointment scheduled Thursday, September 28, 2021 at 1400.  Loletta Parish, RN 09/26/2021 9:42 AM

## 2021-09-28 ENCOUNTER — Other Ambulatory Visit: Payer: Self-pay

## 2021-09-28 ENCOUNTER — Ambulatory Visit
Admission: RE | Admit: 2021-09-28 | Discharge: 2021-09-28 | Disposition: A | Discharge status: Home / Self Care | Payer: Self-pay | Source: Ambulatory Visit | Attending: Obstetrics and Gynecology | Admitting: Obstetrics and Gynecology

## 2021-09-28 ENCOUNTER — Other Ambulatory Visit: Payer: Self-pay | Admitting: Obstetrics and Gynecology

## 2021-09-28 ENCOUNTER — Ambulatory Visit
Admission: RE | Admit: 2021-09-28 | Discharge: 2021-09-28 | Disposition: A | Discharge status: Home / Self Care | Payer: No Typology Code available for payment source | Source: Ambulatory Visit | Attending: Obstetrics and Gynecology | Admitting: Obstetrics and Gynecology

## 2021-09-28 DIAGNOSIS — N6320 Unspecified lump in the left breast, unspecified quadrant: Secondary | ICD-10-CM

## 2021-09-28 DIAGNOSIS — R2232 Localized swelling, mass and lump, left upper limb: Secondary | ICD-10-CM

## 2021-10-04 ENCOUNTER — Other Ambulatory Visit: Payer: Self-pay

## 2021-10-04 ENCOUNTER — Ambulatory Visit
Admission: RE | Admit: 2021-10-04 | Discharge: 2021-10-04 | Disposition: A | Discharge status: Home / Self Care | Payer: No Typology Code available for payment source | Source: Ambulatory Visit | Attending: Obstetrics and Gynecology | Admitting: Obstetrics and Gynecology

## 2021-10-04 ENCOUNTER — Other Ambulatory Visit: Payer: Self-pay | Admitting: Obstetrics and Gynecology

## 2021-10-04 DIAGNOSIS — R2232 Localized swelling, mass and lump, left upper limb: Secondary | ICD-10-CM

## 2021-10-04 DIAGNOSIS — N6320 Unspecified lump in the left breast, unspecified quadrant: Secondary | ICD-10-CM

## 2021-10-10 NOTE — Progress Notes (Signed)
[facility] CONSULT NOTE  Patient Care Team: [name], MD as PCP - General (Pulmonary Disease)  CHIEF COMPLAINTS/PURPOSE OF CONSULTATION:  Newly diagnosed breast cancer  HISTORY OF PRESENTING ILLNESS:  Alicia Morton 40 y.o. female is here because of recent diagnosis of invasive mammary carcinoma of the left breast. She presents with swelling and mass in the left breast which had been present for 2 months. Diagnostic mammogram and [place] on 09/28/2021 showed a large masslike asymmetry involving upper outer and central left breast measuring approximately 9 cm and suspicious calcifications in the upper outer left breast spanning approximately 1.8 cm. Biopsy on 10/04/2021 showed invasive mammary carcinoma with one lymph node involved, ER+(95%)/PR-/Her2-. She presents to the clinic today for initial evaluation and discussion of treatment options.   I reviewed her records extensively and collaborated the history with the patient.  SUMMARY OF ONCOLOGIC HISTORY: Oncology History  Malignant neoplasm of upper-outer quadrant of left breast in female, estrogen receptor positive (HCC)  10/04/2021 Initial Diagnosis   Palpable left breast mass and left breast swelling for 2 months, mammogram revealed 9 cm abnormality at 2 o'clock position plus another mass at 8:00 measuring 1.3 cm: Biopsy of both revealed grade 3 IDC ER 95%, PR 0%, HER2 negative, Ki-67 40%, 5 abnormal lymph nodes: Biopsy of 1 was positive (rib metastases seen on mammogram)   10/11/2021 Cancer Staging   Staging form: Breast, AJCC 8th Edition - Clinical stage from 10/11/2021: Stage IV (cT3, cN1, cM1, G3, ER+, PR-, HER2-) - Signed by Serena Croissant, MD on 10/11/2021 Stage prefix: Initial diagnosis Histologic grading system: 3 grade system      MEDICAL HISTORY:  Past Medical History:  Diagnosis Date   Bronchitis     SURGICAL HISTORY: Past Surgical History:  Procedure Laterality Date   FOOT SURGERY       SOCIAL HISTORY: Social History   Socioeconomic History   Marital status: Married    Spouse name: Not on file   Number of children: Not on file   Years of education: Not on file   Highest education level: High school graduate  Occupational History   Not on file  Tobacco Use   Smoking status: Former    Packs/day: 1.00    Years: 16.00    Pack years: 16.00    Types: Cigarettes, E-cigarettes   Smokeless tobacco: Never  Vaping Use   Vaping Use: Former  Substance and Sexual Activity   Alcohol use: Not Currently   Drug use: Not Currently   Sexual activity: Yes    Birth control/protection: None  Other Topics Concern   Not on file  Social History Narrative   Not on file   Social Determinants of Health   Financial Resource Strain: Not on file  Food Insecurity: No Food Insecurity   Worried About Programme researcher, broadcasting/film/video in the Last Year: Never true   Ran Out of Food in the Last Year: Never true  Transportation Needs: No Transportation Needs   Lack of Transportation (Medical): No   Lack of Transportation (Non-Medical): No  Physical Activity: Not on file  Stress: Not on file  Social Connections: Not on file  Intimate Partner Violence: Not on file    FAMILY HISTORY: Family History  Problem Relation Age of Onset   Hypertension Mother    Hypertension Father    Diabetes Father     ALLERGIES:  has No Known Allergies.  MEDICATIONS:  Current Outpatient Medications  Medication Sig Dispense Refill  tamoxifen (NOLVADEX) 20 MG tablet Take 1 tablet (20 mg total) by mouth daily. 90 tablet 3   albuterol (PROVENTIL) (2.5 MG/3ML) 0.083% nebulizer solution Take 3 mLs (2.5 mg total) by nebulization every 6 (six) hours as needed for wheezing or shortness of breath. 150 mL 1   albuterol (VENTOLIN HFA) 108 (90 Base) MCG/ACT inhaler Inhale 1-2 puffs into the lungs every 6 (six) hours as needed for wheezing or shortness of breath. 18 g 0   cetirizine (ZYRTEC) 10 MG tablet Take 10 mg by  mouth daily.     No current facility-administered medications for this visit.    REVIEW OF SYSTEMS:   Constitutional: Denies fevers, chills or abnormal night sweats Eyes: Denies blurriness of vision, double vision or watery eyes Ears, nose, mouth, throat, and face: Denies mucositis or sore throat Respiratory: Denies cough, dyspnea or wheezes Cardiovascular: Denies palpitation, chest discomfort or lower extremity swelling Gastrointestinal:  Denies nausea, heartburn or change in bowel habits Skin: Denies abnormal skin rashes Lymphatics: Denies new lymphadenopathy or easy bruising Neurological:Denies numbness, tingling or new weaknesses Behavioral/Psych: Mood is stable, no new changes  Breast: From thickening of the left breast with lymphedema, enlarged lymph nodes in the left axilla All other systems were reviewed with the patient and are negative.  PHYSICAL EXAMINATION: ECOG PERFORMANCE STATUS: 1 - Symptomatic but completely ambulatory  Vitals:   10/11/21 1256  BP: (!) 110/58  Pulse: 67  Resp: 18  Temp: 97.7 F (36.5 C)  SpO2: 100%   Filed Weights   10/11/21 1256  Weight: 227 lb 6.4 oz (103.1 kg)    GENERAL:alert, no distress and comfortable SKIN: skin color, texture, turgor are normal, no rashes or significant lesions EYES: normal, conjunctiva are pink and non-injected, sclera clear OROPHARYNX:no exudate, no erythema and lips, buccal mucosa, and tongue normal  NECK: supple, thyroid normal size, non-tender, without nodularity LYMPH:  no palpable lymphadenopathy in the cervical, axillary or inguinal LUNGS: clear to auscultation and percussion with normal breathing effort HEART: regular rate & rhythm and no murmurs and no lower extremity edema ABDOMEN:abdomen soft, non-tender and normal bowel sounds Musculoskeletal:no cyanosis of digits and no clubbing  PSYCH: alert & oriented x 3 with fluent speech NEURO: no focal motor/sensory deficits BREAST: Large palpable left breast  mass with purulence skin changes related to lymphedema, enlarged left axillary lymph nodes (exam performed in the presence of a chaperone)   LABORATORY DATA:  I have reviewed the data as listed Lab Results  Component Value Date   WBC 11.3 (H) 04/15/2021   HGB 11.4 (L) 04/15/2021   HCT 36.8 04/15/2021   MCV 88.0 04/15/2021   PLT 378 04/15/2021   Lab Results  Component Value Date   NA 136 04/15/2021   K 3.7 04/15/2021   CL 105 04/15/2021   CO2 22 04/15/2021    RADIOGRAPHIC STUDIES: I have personally reviewed the radiological reports and agreed with the findings in the report.  ASSESSMENT AND PLAN:  Malignant neoplasm of upper-outer quadrant of left breast in female, estrogen receptor positive ([place]) 10/04/2021: Palpable left breast mass and left breast swelling for 2 months, mammogram revealed 9 cm abnormality at 2 o'clock position plus another mass at 8:00 measuring 1.3 cm: Biopsy of both revealed grade 3 IDC ER 95%, PR 0%, HER2 negative, Ki-67 40%, 5 abnormal lymph nodes: Biopsy of 1 was positive (rib metastases seen on mammogram)  Pathology and radiology counseling: Discussed with the patient, the details of pathology including the type of breast  cancer,the clinical staging, the significance of ER, PR and HER-2/neu receptors and the implications for treatment. After reviewing the pathology in detail, we proceeded to discuss the different treatment options between surgery, radiation, chemotherapy, antiestrogen therapies.  Treatment plan: 1.  Staging scans 2. if the amount of distant metastatic disease is limited, we can treat her aggressively with neoadjuvant chemotherapy followed by MRM and radiation followed by antiestrogen therapy. 3.  If the distant metastatic disease is extensive, we may treat her with palliative intent antiestrogen therapy with CDK inhibitor. 4.  Palliative radiation for any painful lesions 5.  Antiestrogen therapy as appropriate with ovarian function  suppression + AI + Verzenio  Treatment plan: Starting patient on tamoxifen 20 mg daily. Tamoxifen counseling:We discussed the risks and benefits of tamoxifen. These include but not limited to insomnia, hot flashes, mood changes, vaginal dryness, and weight gain. Although rare, serious side effects including endometrial cancer, risk of blood clots were also discussed. We strongly believe that the benefits far outweigh the risks. Patient understands these risks and consented to starting treatment.   Rulon Eisenmenger, MD, MPH 10/11/2021    I, Thana Ates, am acting as scribe for Nicholas Lose, MD.  I have reviewed the above documentation for accuracy and completeness, and I agree with the above.

## 2021-10-11 ENCOUNTER — Other Ambulatory Visit: Payer: Self-pay

## 2021-10-11 ENCOUNTER — Other Ambulatory Visit: Payer: Self-pay | Admitting: *Deleted

## 2021-10-11 ENCOUNTER — Encounter: Payer: Self-pay | Admitting: Physical Therapy

## 2021-10-11 ENCOUNTER — Encounter: Payer: Self-pay | Admitting: *Deleted

## 2021-10-11 ENCOUNTER — Ambulatory Visit (HOSPITAL_BASED_OUTPATIENT_CLINIC_OR_DEPARTMENT_OTHER): Payer: No Typology Code available for payment source | Admitting: Genetic Counselor

## 2021-10-11 ENCOUNTER — Other Ambulatory Visit: Payer: Self-pay | Admitting: Genetic Counselor

## 2021-10-11 ENCOUNTER — Ambulatory Visit: Payer: Medicaid Other | Attending: Hematology and Oncology | Admitting: Physical Therapy

## 2021-10-11 ENCOUNTER — Inpatient Hospital Stay: Payer: Medicaid Other | Attending: Hematology and Oncology | Admitting: Hematology and Oncology

## 2021-10-11 ENCOUNTER — Inpatient Hospital Stay: Payer: Medicaid Other

## 2021-10-11 DIAGNOSIS — Z170 Estrogen receptor positive status [ER+]: Secondary | ICD-10-CM

## 2021-10-11 DIAGNOSIS — R293 Abnormal posture: Secondary | ICD-10-CM

## 2021-10-11 DIAGNOSIS — C50412 Malignant neoplasm of upper-outer quadrant of left female breast: Secondary | ICD-10-CM

## 2021-10-11 DIAGNOSIS — C50812 Malignant neoplasm of overlapping sites of left female breast: Secondary | ICD-10-CM | POA: Insufficient documentation

## 2021-10-11 LAB — GENETIC SCREENING ORDER

## 2021-10-11 MED ORDER — TAMOXIFEN CITRATE 20 MG PO TABS
20.0000 mg | ORAL_TABLET | Freq: Every day | ORAL | 3 refills | Status: DC
  Filled 2021-10-11: qty 30, 30d supply, fill #0

## 2021-10-11 NOTE — Assessment & Plan Note (Addendum)
10/04/2021: Palpable left breast mass and left breast swelling for 2 months, mammogram revealed 9 cm abnormality at 2 o'clock position plus another mass at 8:00 measuring 1.3 cm: Biopsy of both revealed grade 3 IDC ER 95%, PR 0%, HER2 negative, Ki-67 40%, 5 abnormal lymph nodes: Biopsy of 1 was positive (rib metastases seen on mammogram)  Pathology and radiology counseling: Discussed with the patient, the details of pathology including the type of breast cancer,the clinical staging, the significance of ER, PR and HER-2/neu receptors and the implications for treatment. After reviewing the pathology in detail, we proceeded to discuss the different treatment options between surgery, radiation, chemotherapy, antiestrogen therapies.  Treatment plan: 1.  Staging scans 2. if the amount of distant metastatic disease is limited, we can treat her aggressively with neoadjuvant chemotherapy followed by MRM and radiation followed by antiestrogen therapy. 3.  If the distant metastatic disease is extensive, we may treat her with palliative intent antiestrogen therapy with CDK inhibitor. 4.  Palliative radiation for any painful lesions 5.  Antiestrogen therapy as appropriate with ovarian function suppression + AI + Verzenio

## 2021-10-11 NOTE — Patient Instructions (Signed)
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
## 2021-10-12 NOTE — Progress Notes (Signed)
REFERRING PROVIDER: Nicholas Lose, MD [place],  [place] [id number]  PRIMARY PROVIDER:  Elsie Stain, MD  PRIMARY REASON FOR VISIT:  1. Malignant neoplasm of upper-outer quadrant of left breast in female, estrogen receptor positive ([place])     HISTORY OF PRESENT ILLNESS:   Ms. Nery, a 40 y.o. female, was seen for a [place] cancer genetics consultation at the request of Dr. Lindi Adie due to a personal history of cancer.  Ms. Oquin presents to clinic today to discuss the possibility of a hereditary predisposition to cancer, to discuss genetic testing, and to further clarify her future cancer risks, as well as potential cancer risks for family members.   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 The treatment plan is pending.   CANCER HISTORY:  Oncology History  Malignant neoplasm of upper-outer quadrant of left breast in female, estrogen receptor positive ([place])  10/04/2021 Initial Diagnosis   Palpable left breast mass and left breast swelling for 2 months, mammogram revealed 9 cm abnormality at 2 o'clock position plus another mass at 8:00 measuring 1.3 cm: Biopsy of both revealed grade 3 IDC ER 95%, PR 0%, HER2 negative, Ki-67 40%, 5 abnormal lymph nodes: Biopsy of 1 was positive (rib metastases seen on mammogram)   10/11/2021 Cancer Staging   Staging form: Breast, AJCC 8th Edition - Clinical stage from 10/11/2021: Stage IV (cT3, cN1, cM1, G3, ER+, PR-, HER2-) - Signed by Nicholas Lose, MD on 10/11/2021 Stage prefix: Initial diagnosis Histologic grading system: 3 grade system       RISK FACTORS:  Menarche was at age 49.  Nulliparous.  OCP use for approximately 0 years.  Ovaries intact: yes.  Hysterectomy: no.  HRT use: 0 years. Colonoscopy: no; not examined. Mammogram within the last year: yes.   Past Medical History:  Diagnosis Date   Bronchitis     Past Surgical History:   Procedure Laterality Date   FOOT SURGERY      Social History   Socioeconomic History   Marital status: Married    Spouse name: Not on file   Number of children: Not on file   Years of education: Not on file   Highest education level: High school graduate  Occupational History   Not on file  Tobacco Use   Smoking status: Former    Packs/day: 1.00    Years: 16.00    Pack years: 16.00    Types: Cigarettes, E-cigarettes   Smokeless tobacco: Never  Vaping Use   Vaping Use: Former  Substance and Sexual Activity   Alcohol use: Not Currently   Drug use: Not Currently   Sexual activity: Yes    Birth control/protection: None  Other Topics Concern   Not on file  Social History Narrative   Not on file   Social Determinants of Health   Financial Resource Strain: Not on file  Food Insecurity: No Food Insecurity   Worried About Running Out of Food in the Last Year: Never true   [place] in the Last Year: Never true  Transportation Needs: No Transportation Needs   Lack of Transportation (Medical): No   Lack of Transportation (Non-Medical): No  Physical Activity: Not on file  Stress: Not on file  Social Connections: Not on file     FAMILY HISTORY:  We obtained a detailed, 4-generation family history.  Ms. Huang did not report a family history of cancer.  Ms. Bibby is unaware of previous family history of genetic testing for hereditary cancer risks. There is no reported Ashkenazi Jewish ancestry. There is no known consanguinity.  GENETIC COUNSELING ASSESSMENT: Ms. Vowles is a 40 y.o. female with a personal history of cancer which is somewhat suggestive of a hereditary cancer syndrome and predisposition to cancer given her age of diagnosis. We, therefore, discussed and recommended the following at today's visit.   DISCUSSION: We discussed that 5 - 10% of cancer is hereditary, with most cases of hereditary breast cancer associated with mutations in BRCA1/2.  There are  other genes that can be associated with hereditary breast cancer syndromes.  Type of cancer risk and level of risk are gene-specific. We discussed that testing is beneficial for several reasons including knowing how to follow individuals after completing their treatment, identifying whether potential treatment options would be beneficial, such as PARP inhibitors, and understanding if other family members could be at risk for cancer and allowing them to undergo genetic testing.   We reviewed the characteristics, features and inheritance patterns of hereditary cancer syndromes. We also discussed genetic testing, including the appropriate family members to test, the process of testing, insurance coverage and turn-around-time for results. We discussed the implications of a negative, positive and/or variant of uncertain significant result. In order to get genetic test results in a timely manner so that Ms. Ron can use these genetic test results for surgical decisions, we recommended Ms. Bertell Maria pursue genetic testing for the [facility] Breast Cancer STAT Panel.  The STAT Breast cancer panel offered by [facility] includes sequencing and rearrangement analysis for the following 9 genes:  ATM, BRCA1, BRCA2, CDH1, CHEK2, PALB2, PTEN, STK11 and TP53.  Once complete, we recommend Ms. Mcmenamin pursue reflex genetic testing to a more comprehensive gene panel.   Ms. Sottile  was offered a common hereditary cancer panel (47 genes) and an expanded pan-cancer panel (84 genes). Ms. Sandy was informed of the benefits and limitations of each panel, including that expanded pan-cancer panels contain genes that do not have clear management guidelines at this point in time.  We also discussed that as the number of genes included on a panel increases, the chances of variants of uncertain significance increases.  After considering the benefits and limitations of each gene panel, Ms. Killingsworth  elected to have an expanded pan-cancer panel through  [facility].  The Multi-Cancer + RNA Panel offered by [facility] includes sequencing and/or deletion/duplication analysis of the following 84 genes:  AIP*, ALK, APC*, ATM*, AXIN2*, BAP1*, BARD1*, BLM*, BMPR1A*, BRCA1*, BRCA2*, BRIP1*, CASR, CDC73*, CDH1*, CDK4, CDKN1B*, CDKN1C*, CDKN2A, CEBPA, CHEK2*, CTNNA1*, DICER1*, DIS3L2*, EGFR, EPCAM, FH*, FLCN*, GATA2*, GPC3, GREM1, HOXB13, HRAS, KIT, MAX*, MEN1*, MET, MITF, MLH1*, MSH2*, MSH3*, MSH6*, MUTYH*, NBN*, NF1*, NF2*, NTHL1*, PALB2*, PDGFRA, PHOX2B, PMS2*, POLD1*, POLE*, POT1*, PRKAR1A*, PTCH1*, PTEN*, RAD50*, RAD51C*, RAD51D*, RB1*, RECQL4, RET, RUNX1*, SDHA*, SDHAF2*, SDHB*, SDHC*, SDHD*, SMAD4*, SMARCA4*, SMARCB1*, SMARCE1*, STK11*, SUFU*, TERC, TERT, TMEM127*, Tp53*, TSC1*, TSC2*, VHL*, WRN*, and WT1.  RNA analysis is performed for * genes.  Based on Ms. Favila's personal history of cancer, she meets medical criteria for genetic testing.  Ms. Micheli applied for the patient assistance program through [employer] to waive her cost of testing.  She is aware that [facility] may reach out to her and ask for proof of income.   PLAN: After considering the risks, benefits, and limitations, Ms. Trevizo provided informed consent to pursue genetic testing and the blood sample was sent to [facility] for  analysis of the STAT and Multi-Cancer +RNA Panel. Results should be available within approximately 1-2 weeks' time, at which point they will be disclosed by telephone to Ms. Gessel, as will any additional recommendations warranted by these results. Ms. Pallo will receive a summary of her genetic counseling visit and a copy of her results once available. This information will also be available in Epic.    Lastly, we encouraged Ms. Zender to remain in contact with cancer genetics annually so that we can continuously update the family history and inform her of any changes in cancer genetics and testing that may be of benefit for this family.   Ms. Barbee  questions were answered to her satisfaction today. Our contact information was provided should additional questions or concerns arise. Thank you for the referral and allowing [place] to share in the care of your patient.   Ica Daye M. Joette Catching, [place], [facility] Genetic Counselor [email or web address] (P) [phone]  The patient was seen for a total of 20 minutes in face-to-face genetic counseling.  The patient was accompanied by her wife, Lazaro Arms.  Drs. Magrinat, Lindi Adie and/or Burr Medico were available to discuss this case as needed.  _______________________________________________________________________ For Office Staff:  Number of people involved in session: 2 Was an Intern/ student involved with case: no

## 2021-10-13 ENCOUNTER — Ambulatory Visit: Payer: Self-pay | Admitting: Surgery
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
## 2021-10-17 ENCOUNTER — Encounter (HOSPITAL_BASED_OUTPATIENT_CLINIC_OR_DEPARTMENT_OTHER): Payer: Self-pay | Admitting: Surgery

## 2021-10-17 ENCOUNTER — Other Ambulatory Visit: Payer: Self-pay

## 2021-10-20 ENCOUNTER — Other Ambulatory Visit: Payer: Self-pay

## 2021-10-20 ENCOUNTER — Ambulatory Visit (HOSPITAL_COMMUNITY)
Admission: RE | Admit: 2021-10-20 | Discharge: 2021-10-20 | Disposition: A | Discharge status: Home / Self Care | Payer: Medicaid Other | Source: Ambulatory Visit | Attending: Hematology and Oncology | Admitting: Hematology and Oncology

## 2021-10-20 ENCOUNTER — Telehealth: Payer: Self-pay | Admitting: Genetic Counselor

## 2021-10-20 ENCOUNTER — Encounter (HOSPITAL_COMMUNITY)
Admission: RE | Admit: 2021-10-20 | Discharge: 2021-10-20 | Disposition: A | Discharge status: Home / Self Care | Payer: Medicaid Other | Source: Ambulatory Visit | Attending: Hematology and Oncology | Admitting: Hematology and Oncology

## 2021-10-20 ENCOUNTER — Encounter: Payer: Self-pay | Admitting: Genetic Counselor

## 2021-10-20 DIAGNOSIS — C50412 Malignant neoplasm of upper-outer quadrant of left female breast: Secondary | ICD-10-CM | POA: Diagnosis present

## 2021-10-20 DIAGNOSIS — Z1379 Encounter for other screening for genetic and chromosomal anomalies: Secondary | ICD-10-CM | POA: Insufficient documentation

## 2021-10-20 DIAGNOSIS — Z170 Estrogen receptor positive status [ER+]: Secondary | ICD-10-CM

## 2021-10-20 MED ORDER — IOHEXOL 350 MG/ML SOLN
80.0000 mL | Freq: Once | INTRAVENOUS | Status: AC | PRN
  Administered 2021-10-20: 80 mL via INTRAVENOUS

## 2021-10-20 MED ORDER — TECHNETIUM TC 99M MEDRONATE IV KIT
20.0000 | PACK | Freq: Once | INTRAVENOUS | Status: AC | PRN
  Administered 2021-10-20: 18.5 via INTRAVENOUS

## 2021-10-20 NOTE — Telephone Encounter (Signed)
I contacted Alicia Morton to discuss her [facility] Breast Cancer STAT Panel results. No pathogenic variants were identified in the 9 genes analyzed. Of note, we are still waiting on additional genes and will contact her when the final results are available.   The test report has been scanned into EPIC and is located under the Molecular Pathology section of the Results Review tab.  A portion of the result report is included below for reference. Detailed clinic note to follow.  Alicia Passy, MS, [facility] Genetic Counselor [email or web address] (P) [phone]

## 2021-10-20 NOTE — Telephone Encounter (Signed)
I attempted to contact Alicia Morton to discuss her [facility] STAT Panel genetic testing results (9 genes). I left a voicemail requesting she call me back at [phone].  Lucille Passy, MS, [facility] Genetic Counselor [email or web address] (P) [phone]
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
## 2021-10-23 ENCOUNTER — Ambulatory Visit: Payer: Self-pay | Attending: Critical Care Medicine | Admitting: Critical Care Medicine

## 2021-10-23 ENCOUNTER — Encounter: Payer: Self-pay | Admitting: Critical Care Medicine

## 2021-10-23 ENCOUNTER — Encounter: Payer: Self-pay | Admitting: *Deleted

## 2021-10-23 ENCOUNTER — Other Ambulatory Visit: Payer: Self-pay

## 2021-10-23 VITALS — BP 115/78 | HR 63 | Resp 16 | Wt 214.2 lb

## 2021-10-23 DIAGNOSIS — W3400XA Accidental discharge from unspecified firearms or gun, initial encounter: Secondary | ICD-10-CM

## 2021-10-23 DIAGNOSIS — C50412 Malignant neoplasm of upper-outer quadrant of left female breast: Secondary | ICD-10-CM

## 2021-10-23 DIAGNOSIS — Z124 Encounter for screening for malignant neoplasm of cervix: Secondary | ICD-10-CM

## 2021-10-23 DIAGNOSIS — Z170 Estrogen receptor positive status [ER+]: Secondary | ICD-10-CM

## 2021-10-23 DIAGNOSIS — J4520 Mild intermittent asthma, uncomplicated: Secondary | ICD-10-CM

## 2021-10-23 NOTE — Assessment & Plan Note (Signed)
Stable asthma continue inhaled albuterol as needed

## 2021-10-23 NOTE — Assessment & Plan Note (Signed)
Care per medical oncology and surgical oncology and radiation oncology  Patient to inquire with oncology whether she can receive the flu vaccine and pneumococcal  vaccine

## 2021-10-23 NOTE — Progress Notes (Signed)
Patient Care Team: Alicia Stain, MD as PCP - General (Pulmonary Disease) Alicia Germany, RN as Oncology Nurse Navigator Alicia Kaufmann, RN as Oncology Nurse Navigator  DIAGNOSIS:    ICD-10-CM   1. Malignant neoplasm of upper-outer quadrant of left breast in female, estrogen receptor positive ([place])  C50.412    Z17.0       SUMMARY OF ONCOLOGIC HISTORY: Oncology History  Malignant neoplasm of upper-outer quadrant of left breast in female, estrogen receptor positive ([place])  10/04/2021 Initial Diagnosis   Palpable left breast mass and left breast swelling for 2 months, mammogram revealed 9 cm abnormality at 2 o'clock position plus another mass at 8:00 measuring 1.3 cm: Biopsy of both revealed grade 3 IDC ER 95%, PR 0%, HER2 negative, Ki-67 40%, 5 abnormal lymph nodes: Biopsy of 1 was positive (rib metastases seen on mammogram)   10/11/2021 Cancer Staging   Staging form: Breast, AJCC 8th Edition - Clinical stage from 10/11/2021: Stage IV (cT3, cN1, cM1, G3, ER+, PR-, HER2-) - Signed by Nicholas Lose, MD on 10/11/2021 Stage prefix: Initial diagnosis Histologic grading system: 3 grade system    10/20/2021 Genetic Testing   [facility] Breast Cancer STAT Panel (9 genes) was Negative. Report date is 10/20/2021.  The STAT Breast cancer panel offered by [facility] includes sequencing and rearrangement analysis for the following 9 genes:  ATM, BRCA1, BRCA2, CDH1, CHEK2, PALB2, PTEN, STK11 and TP53.       CHIEF COMPLIANT: Follow-up of left breast cancer  INTERVAL HISTORY: Alicia Morton is a 40 y.o. with above-mentioned history of left breast cancer. CT CAP on 10/20/2021 showed skin thickening left breast with asymmetric soft tissue in the lateral aspect of the left breast, left axillary lymphadenopathy, no evidence for distant metastatic disease in the chest, abdomen, or pelvis, and 2 mm right middle lobe pulmonary nodule, stable. She presents to the clinic today for follow-up.    ALLERGIES:  has No Known Allergies.  MEDICATIONS:  Current Outpatient Medications  Medication Sig Dispense Refill   albuterol (PROVENTIL) (2.5 MG/3ML) 0.083% nebulizer solution Take 3 mLs (2.5 mg total) by nebulization every 6 (six) hours as needed for wheezing or shortness of breath. 150 mL 1   albuterol (VENTOLIN HFA) 108 (90 Base) MCG/ACT inhaler Inhale 1-2 puffs into the lungs every 6 (six) hours as needed for wheezing or shortness of breath. 18 g 0   cetirizine (ZYRTEC) 10 MG tablet Take 10 mg by mouth daily.     tamoxifen (NOLVADEX) 20 MG tablet Take 1 tablet (20 mg total) by mouth daily. 90 tablet 3   No current facility-administered medications for this visit.    PHYSICAL EXAMINATION: ECOG PERFORMANCE STATUS: 1 - Symptomatic but completely ambulatory  Vitals:   10/24/21 1001  BP: (!) 119/50  Pulse: 62  Resp: 18  Temp: (!) 97.5 F (36.4 C)  SpO2: 100%   Filed Weights   10/24/21 1001  Weight: 229 lb 6.4 oz (104.1 kg)      LABORATORY DATA:  I have reviewed the data as listed CMP Latest Ref Rng & Units 04/15/2021  Glucose 70 - 99 mg/dL 95  BUN 6 - 20 mg/dL 10  Creatinine 0.44 - 1.00 mg/dL 0.79  Sodium 135 - 145 mmol/L 136  Potassium 3.5 - 5.1 mmol/L 3.7  Chloride 98 - 111 mmol/L 105  CO2 22 - 32 mmol/L 22  Calcium 8.9 - 10.3 mg/dL 8.7(L)  Total Protein 6.5 - 8.1 g/dL 7.9  Total Bilirubin 0.3 -  1.2 mg/dL 0.5  Alkaline Phos 38 - 126 U/L 75  AST 15 - 41 U/L 22  ALT 0 - 44 U/L 13    Lab Results  Component Value Date   WBC 11.3 (H) 04/15/2021   HGB 11.4 (L) 04/15/2021   HCT 36.8 04/15/2021   MCV 88.0 04/15/2021   PLT 378 04/15/2021    ASSESSMENT & PLAN:  Malignant neoplasm of upper-outer quadrant of left breast in female, estrogen receptor positive ([place]) 10/04/2021: Palpable left breast mass and left breast swelling for 2 months, mammogram revealed 9 cm abnormality at 2 o'clock position plus another mass at 8:00 measuring 1.3 cm: Biopsy of both revealed  grade 3 IDC ER 95%, PR 0%, HER2 negative, Ki-67 40%, 5 abnormal lymph nodes: Biopsy of 1 was positive (rib metastases seen on mammogram)  CT CAP 10/24/2021: Left breast cancer and left axillary lymph nodes, no evidence of distant metastatic disease. Bone scan 10/23/2021: No evidence of bony metastases.  Left mandible activity benign  Treatment plan: 1.  Neoadjuvant chemotherapy with dose dense Adriamycin and Cytoxan followed by Taxol 2. modified radical mastectomy 3.  Adjuvant radiation 4.  Adjuvant antiestrogen therapy with ovarian function suppression plus + AI + Verzinio  I instructed the patient to stop tamoxifen Plan: 1.  Port placement 2. Echocardiogram 3.  Chemo class 4.  Nausea study  Return to clinic to start chemotherapy    No orders of the defined types were placed in this encounter.  The patient has a good understanding of the overall plan. she agrees with it. she will call with any problems that may develop before the next visit here.  Total time spent: 45 mins including face to face time and time spent for planning, charting and coordination of care  Alicia Eisenmenger, MD, MPH 10/24/2021  I, Thana Ates, am acting as scribe for Dr. Nicholas Lose.  I have reviewed the above documentation for accuracy and completeness, and I agree with the above.

## 2021-10-23 NOTE — Assessment & Plan Note (Signed)
Patient has appointment November 30 for Pap smear
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
## 2021-10-23 NOTE — Progress Notes (Signed)
Pre Op check list provided along with CHG soap and instructions.     Enhanced Recovery after Surgery for Orthopedics Enhanced Recovery after Surgery is a protocol used to improve the stress on your body and your recovery after surgery.  Patient Instructions  The night before surgery:  No food after midnight. ONLY clear liquids after midnight  The day of surgery (if you do NOT have diabetes):  Drink ONE (1) Pre-Surgery Clear Ensure as directed.   This drink was given to you during your hospital  pre-op appointment visit. The pre-op nurse will instruct you on the time to drink the  Pre-Surgery Ensure depending on your surgery time. Finish the drink at the designated time by the pre-op nurse.  Nothing else to drink after completing the  Pre-Surgery Clear Ensure.  The day of surgery (if you have diabetes): Drink ONE (1) Gatorade 2 (G2) as directed. This drink was given to you during your hospital  pre-op appointment visit.  The pre-op nurse will instruct you on the time to drink the   Gatorade 2 (G2) depending on your surgery time. Color of the Gatorade may vary. Red is not allowed. Nothing else to drink after completing the  Gatorade 2 (G2).         If you have questions, please contact your surgeon's office.

## 2021-10-23 NOTE — Patient Instructions (Signed)
No change in medications  Please consider a flu vaccine and a pneumonia vaccine you can discuss this with oncology as well regarding timing  Keep all of your follow-up appointments you already have established with general surgery and oncology  Please reestablish with your dentist once you obtain Medicaid  Return to see Dr. [name] 3 months

## 2021-10-24 ENCOUNTER — Ambulatory Visit (HOSPITAL_BASED_OUTPATIENT_CLINIC_OR_DEPARTMENT_OTHER)
Admission: RE | Admit: 2021-10-24 | Discharge: 2021-10-24 | Disposition: A | Discharge status: Home / Self Care | Payer: Medicaid Other | Attending: Surgery | Admitting: Surgery

## 2021-10-24 ENCOUNTER — Inpatient Hospital Stay: Payer: Medicaid Other | Admitting: Emergency Medicine

## 2021-10-24 ENCOUNTER — Ambulatory Visit (HOSPITAL_COMMUNITY): Payer: Medicaid Other

## 2021-10-24 ENCOUNTER — Other Ambulatory Visit: Payer: Self-pay | Admitting: *Deleted

## 2021-10-24 ENCOUNTER — Other Ambulatory Visit: Payer: Self-pay

## 2021-10-24 ENCOUNTER — Encounter (HOSPITAL_BASED_OUTPATIENT_CLINIC_OR_DEPARTMENT_OTHER)
Admission: RE | Disposition: A | Discharge status: Home / Self Care | Payer: Self-pay | Source: Home / Self Care | Attending: Surgery

## 2021-10-24 ENCOUNTER — Encounter (HOSPITAL_BASED_OUTPATIENT_CLINIC_OR_DEPARTMENT_OTHER): Payer: Self-pay | Admitting: Surgery

## 2021-10-24 ENCOUNTER — Ambulatory Visit (HOSPITAL_BASED_OUTPATIENT_CLINIC_OR_DEPARTMENT_OTHER): Payer: Medicaid Other | Admitting: Certified Registered"

## 2021-10-24 ENCOUNTER — Inpatient Hospital Stay: Payer: Medicaid Other | Attending: Hematology and Oncology | Admitting: Hematology and Oncology

## 2021-10-24 VITALS — BP 119/50 | HR 62 | Temp 97.5°F | Resp 18 | Ht 70.0 in | Wt 229.4 lb

## 2021-10-24 DIAGNOSIS — Z006 Encounter for examination for normal comparison and control in clinical research program: Secondary | ICD-10-CM | POA: Diagnosis present

## 2021-10-24 DIAGNOSIS — R921 Mammographic calcification found on diagnostic imaging of breast: Secondary | ICD-10-CM | POA: Diagnosis not present

## 2021-10-24 DIAGNOSIS — R5383 Other fatigue: Secondary | ICD-10-CM | POA: Diagnosis not present

## 2021-10-24 DIAGNOSIS — C7951 Secondary malignant neoplasm of bone: Secondary | ICD-10-CM | POA: Diagnosis not present

## 2021-10-24 DIAGNOSIS — C50412 Malignant neoplasm of upper-outer quadrant of left female breast: Secondary | ICD-10-CM

## 2021-10-24 DIAGNOSIS — Z452 Encounter for adjustment and management of vascular access device: Secondary | ICD-10-CM | POA: Insufficient documentation

## 2021-10-24 DIAGNOSIS — Z87891 Personal history of nicotine dependence: Secondary | ICD-10-CM | POA: Insufficient documentation

## 2021-10-24 DIAGNOSIS — Z5111 Encounter for antineoplastic chemotherapy: Secondary | ICD-10-CM | POA: Insufficient documentation

## 2021-10-24 DIAGNOSIS — Z5189 Encounter for other specified aftercare: Secondary | ICD-10-CM | POA: Insufficient documentation

## 2021-10-24 DIAGNOSIS — Z170 Estrogen receptor positive status [ER+]: Secondary | ICD-10-CM | POA: Insufficient documentation

## 2021-10-24 DIAGNOSIS — R234 Changes in skin texture: Secondary | ICD-10-CM | POA: Diagnosis not present

## 2021-10-24 DIAGNOSIS — J45909 Unspecified asthma, uncomplicated: Secondary | ICD-10-CM | POA: Insufficient documentation

## 2021-10-24 DIAGNOSIS — K5900 Constipation, unspecified: Secondary | ICD-10-CM | POA: Diagnosis not present

## 2021-10-24 DIAGNOSIS — C50919 Malignant neoplasm of unspecified site of unspecified female breast: Secondary | ICD-10-CM

## 2021-10-24 HISTORY — PX: PORTACATH PLACEMENT: SHX2246

## 2021-10-24 HISTORY — DX: Malignant neoplasm of unspecified site of unspecified female breast: C50.919

## 2021-10-24 LAB — POCT PREGNANCY, URINE: Preg Test, Ur: NEGATIVE

## 2021-10-24 SURGERY — INSERTION, TUNNELED CENTRAL VENOUS DEVICE, WITH PORT
Anesthesia: General | Site: Chest | Laterality: Right

## 2021-10-24 MED ORDER — ONDANSETRON HCL 4 MG/2ML IJ SOLN
INTRAMUSCULAR | Status: DC | PRN
  Administered 2021-10-24: 4 mg via INTRAVENOUS

## 2021-10-24 MED ORDER — HEPARIN (PORCINE) IN NACL 2-0.9 UNITS/ML
INTRAMUSCULAR | Status: AC | PRN
  Administered 2021-10-24: 1 via INTRAVENOUS

## 2021-10-24 MED ORDER — ACETAMINOPHEN 500 MG PO TABS
ORAL_TABLET | ORAL | Status: AC
  Filled 2021-10-24: qty 2

## 2021-10-24 MED ORDER — LACTATED RINGERS IV SOLN: INTRAVENOUS | Status: DC

## 2021-10-24 MED ORDER — ACETAMINOPHEN 500 MG PO TABS
1000.0000 mg | ORAL_TABLET | ORAL | Status: AC
  Administered 2021-10-24: 1000 mg via ORAL

## 2021-10-24 MED ORDER — DEXAMETHASONE SODIUM PHOSPHATE 10 MG/ML IJ SOLN
INTRAMUSCULAR | Status: DC | PRN
  Administered 2021-10-24: 5 mg via INTRAVENOUS

## 2021-10-24 MED ORDER — PROPOFOL 10 MG/ML IV BOLUS
INTRAVENOUS | Status: AC
  Filled 2021-10-24: qty 20

## 2021-10-24 MED ORDER — ONDANSETRON HCL 8 MG PO TABS
8.0000 mg | ORAL_TABLET | Freq: Two times a day (BID) | ORAL | 1 refills | Status: DC | PRN
  Filled 2021-10-25: qty 30, 15d supply, fill #0

## 2021-10-24 MED ORDER — LACTATED RINGERS IV SOLN: INTRAVENOUS | Status: DC | PRN

## 2021-10-24 MED ORDER — LIDOCAINE HCL (CARDIAC) PF 100 MG/5ML IV SOSY
PREFILLED_SYRINGE | INTRAVENOUS | Status: DC | PRN
  Administered 2021-10-24: 100 mg via INTRAVENOUS

## 2021-10-24 MED ORDER — BUPIVACAINE HCL (PF) 0.25 % IJ SOLN
INTRAMUSCULAR | Status: AC
  Filled 2021-10-24: qty 30

## 2021-10-24 MED ORDER — DEXAMETHASONE 4 MG PO TABS
4.0000 mg | ORAL_TABLET | Freq: Every day | ORAL | 0 refills | Status: DC
  Filled 2021-10-25: qty 8, 8d supply, fill #0

## 2021-10-24 MED ORDER — LIDOCAINE 2% (20 MG/ML) 5 ML SYRINGE
INTRAMUSCULAR | Status: AC
  Filled 2021-10-24: qty 5

## 2021-10-24 MED ORDER — MIDAZOLAM HCL 2 MG/2ML IJ SOLN
INTRAMUSCULAR | Status: DC | PRN
  Administered 2021-10-24: 2 mg via INTRAVENOUS

## 2021-10-24 MED ORDER — HEPARIN SOD (PORK) LOCK FLUSH 100 UNIT/ML IV SOLN
INTRAVENOUS | Status: AC
  Filled 2021-10-24: qty 5

## 2021-10-24 MED ORDER — CEFAZOLIN SODIUM-DEXTROSE 2-4 GM/100ML-% IV SOLN: 2.0000 g | INTRAVENOUS | Status: DC

## 2021-10-24 MED ORDER — HEPARIN SOD (PORK) LOCK FLUSH 100 UNIT/ML IV SOLN
INTRAVENOUS | Status: DC | PRN
  Administered 2021-10-24: 500 [IU]

## 2021-10-24 MED ORDER — FENTANYL CITRATE (PF) 100 MCG/2ML IJ SOLN: 25.0000 ug | INTRAMUSCULAR | Status: DC | PRN

## 2021-10-24 MED ORDER — CHLORHEXIDINE GLUCONATE CLOTH 2 % EX PADS: 6.0000 | MEDICATED_PAD | Freq: Once | CUTANEOUS | Status: DC

## 2021-10-24 MED ORDER — MIDAZOLAM HCL 2 MG/2ML IJ SOLN
INTRAMUSCULAR | Status: AC
  Filled 2021-10-24: qty 2

## 2021-10-24 MED ORDER — FENTANYL CITRATE (PF) 100 MCG/2ML IJ SOLN
INTRAMUSCULAR | Status: DC | PRN
  Administered 2021-10-24: 50 ug via INTRAVENOUS
  Administered 2021-10-24 (×2): 25 ug via INTRAVENOUS

## 2021-10-24 MED ORDER — FENTANYL CITRATE (PF) 100 MCG/2ML IJ SOLN
INTRAMUSCULAR | Status: AC
  Filled 2021-10-24: qty 2

## 2021-10-24 MED ORDER — HYDROCODONE-ACETAMINOPHEN 5-325 MG PO TABS: 1.0000 | ORAL_TABLET | Freq: Four times a day (QID) | ORAL | 0 refills | Status: DC | PRN

## 2021-10-24 MED ORDER — CEFAZOLIN SODIUM-DEXTROSE 2-4 GM/100ML-% IV SOLN
INTRAVENOUS | Status: AC
  Filled 2021-10-24: qty 100

## 2021-10-24 MED ORDER — CEFAZOLIN SODIUM-DEXTROSE 2-3 GM-%(50ML) IV SOLR
INTRAVENOUS | Status: DC | PRN
  Administered 2021-10-24: 2 g via INTRAVENOUS

## 2021-10-24 MED ORDER — HEPARIN (PORCINE) IN NACL 1000-0.9 UT/500ML-% IV SOLN
INTRAVENOUS | Status: AC
  Filled 2021-10-24: qty 500

## 2021-10-24 MED ORDER — LIDOCAINE-PRILOCAINE 2.5-2.5 % EX CREA
TOPICAL_CREAM | CUTANEOUS | 3 refills | Status: DC
  Filled 2021-10-25: qty 30, 20d supply, fill #0

## 2021-10-24 MED ORDER — BUPIVACAINE-EPINEPHRINE 0.25% -1:200000 IJ SOLN
INTRAMUSCULAR | Status: DC | PRN
  Administered 2021-10-24: 4 mL

## 2021-10-24 MED ORDER — PROCHLORPERAZINE MALEATE 10 MG PO TABS
10.0000 mg | ORAL_TABLET | Freq: Four times a day (QID) | ORAL | 1 refills | Status: DC | PRN
  Filled 2021-10-25: qty 30, 8d supply, fill #0

## 2021-10-24 MED ORDER — PROPOFOL 10 MG/ML IV BOLUS
INTRAVENOUS | Status: DC | PRN
  Administered 2021-10-24: 200 mg via INTRAVENOUS

## 2021-10-24 MED ORDER — PHENYLEPHRINE HCL (PRESSORS) 10 MG/ML IV SOLN
INTRAVENOUS | Status: DC | PRN
  Administered 2021-10-24: 80 ug via INTRAVENOUS

## 2021-10-24 MED ORDER — BUPIVACAINE-EPINEPHRINE (PF) 0.25% -1:200000 IJ SOLN
INTRAMUSCULAR | Status: AC
  Filled 2021-10-24: qty 30

## 2021-10-24 SURGICAL SUPPLY — 54 items
APL PRP STRL LF DISP 70% ISPRP (MISCELLANEOUS) ×1
APL SKNCLS STERI-STRIP NONHPOA (GAUZE/BANDAGES/DRESSINGS) ×1
BAG DECANTER FOR FLEXI CONT (MISCELLANEOUS) ×2 IMPLANT
BENZOIN TINCTURE PRP APPL 2/3 (GAUZE/BANDAGES/DRESSINGS) ×2 IMPLANT
BLADE SURG 11 STRL SS (BLADE) ×2 IMPLANT
BLADE SURG 15 STRL LF DISP TIS (BLADE) ×1 IMPLANT
BLADE SURG 15 STRL SS (BLADE) ×2
CANISTER SUCT 1200ML W/VALVE (MISCELLANEOUS) IMPLANT
CHLORAPREP W/TINT 26 (MISCELLANEOUS) ×2 IMPLANT
CLEANER CAUTERY TIP 5X5 PAD (MISCELLANEOUS) ×1 IMPLANT
COVER BACK TABLE 60X90IN (DRAPES) ×2 IMPLANT
COVER MAYO STAND STRL (DRAPES) ×2 IMPLANT
COVER PROBE 5X48 (MISCELLANEOUS)
DECANTER SPIKE VIAL GLASS SM (MISCELLANEOUS) ×2 IMPLANT
DRAPE C-ARM 42X72 X-RAY (DRAPES) ×2 IMPLANT
DRAPE LAPAROTOMY TRNSV 102X78 (DRAPES) ×2 IMPLANT
DRAPE UTILITY XL STRL (DRAPES) ×2 IMPLANT
DRSG TEGADERM 4X4.75 (GAUZE/BANDAGES/DRESSINGS) ×2 IMPLANT
ELECT REM PT RETURN 9FT ADLT (ELECTROSURGICAL) ×2
ELECTRODE REM PT RTRN 9FT ADLT (ELECTROSURGICAL) ×1 IMPLANT
GAUZE 4X4 16PLY ~~LOC~~+RFID DBL (SPONGE) ×1 IMPLANT
GAUZE SPONGE 4X4 12PLY STRL LF (GAUZE/BANDAGES/DRESSINGS) IMPLANT
GLOVE SRG 8 PF TXTR STRL LF DI (GLOVE) IMPLANT
GLOVE SURG ENC MOIS LTX SZ7 (GLOVE) ×2 IMPLANT
GLOVE SURG UNDER POLY LF SZ7 (GLOVE) ×1 IMPLANT
GLOVE SURG UNDER POLY LF SZ7.5 (GLOVE) ×2 IMPLANT
GLOVE SURG UNDER POLY LF SZ8 (GLOVE) ×2
GOWN STRL REUS W/ TWL LRG LVL3 (GOWN DISPOSABLE) ×1 IMPLANT
GOWN STRL REUS W/TWL 2XL LVL3 (GOWN DISPOSABLE) ×1 IMPLANT
GOWN STRL REUS W/TWL LRG LVL3 (GOWN DISPOSABLE) ×2
IV KIT MINILOC 20X1 SAFETY (NEEDLE) IMPLANT
KIT CVR 48X5XPRB PLUP LF (MISCELLANEOUS) IMPLANT
KIT PORT POWER 8FR ISP CVUE (Port) ×1 IMPLANT
NDL HYPO 25X1 1.5 SAFETY (NEEDLE) ×1 IMPLANT
NDL SAFETY ECLIPSE 18X1.5 (NEEDLE) IMPLANT
NDL SPNL 22GX3.5 QUINCKE BK (NEEDLE) IMPLANT
NEEDLE HYPO 18GX1.5 SHARP (NEEDLE)
NEEDLE HYPO 25X1 1.5 SAFETY (NEEDLE) ×2 IMPLANT
NEEDLE SPNL 22GX3.5 QUINCKE BK (NEEDLE) IMPLANT
PACK BASIN DAY SURGERY FS (CUSTOM PROCEDURE TRAY) ×2 IMPLANT
PAD CLEANER CAUTERY TIP 5X5 (MISCELLANEOUS) ×1
PENCIL SMOKE EVACUATOR (MISCELLANEOUS) ×2 IMPLANT
SLEEVE SCD COMPRESS KNEE MED (STOCKING) ×2 IMPLANT
SPONGE GAUZE 2X2 8PLY STRL LF (GAUZE/BANDAGES/DRESSINGS) ×2 IMPLANT
STRIP CLOSURE SKIN 1/2X4 (GAUZE/BANDAGES/DRESSINGS) ×2 IMPLANT
SUT MON AB 4-0 PC3 18 (SUTURE) ×2 IMPLANT
SUT PROLENE 2 0 CT2 30 (SUTURE) ×2 IMPLANT
SUT VIC AB 3-0 SH 27 (SUTURE) ×2
SUT VIC AB 3-0 SH 27X BRD (SUTURE) ×1 IMPLANT
SYR 5ML LUER SLIP (SYRINGE) ×2 IMPLANT
SYR CONTROL 10ML LL (SYRINGE) ×2 IMPLANT
TOWEL GREEN STERILE FF (TOWEL DISPOSABLE) ×2 IMPLANT
TUBE CONNECTING 20X1/4 (TUBING) IMPLANT
YANKAUER SUCT BULB TIP NO VENT (SUCTIONS) IMPLANT

## 2021-10-24 NOTE — Transfer of Care (Signed)
Immediate Anesthesia Transfer of Care Note  Patient: Alicia Morton  Procedure(s) Performed: INSERTION PORT-A-CATH (Right: Chest)  Patient Location: PACU  Anesthesia Type:General  Level of Consciousness: awake, alert  and oriented  Airway & Oxygen Therapy: Patient Spontanous Breathing and Patient connected to face mask oxygen  Post-op Assessment: Report given to RN and Post -op Vital signs reviewed and stable  Post vital signs: Reviewed and stable  Last Vitals:  Vitals Value Taken Time  BP    Temp    Pulse    Resp    SpO2      Last Pain:  Vitals:   10/24/21 1257  TempSrc: Oral  PainSc: 0-No pain      Patients Stated Pain Goal: 3 (16/07/37 1062)  Complications: No notable events documented.

## 2021-10-24 NOTE — Anesthesia Preprocedure Evaluation (Addendum)
Anesthesia Evaluation  Patient identified by MRN, date of birth, ID band Patient awake    Reviewed: Allergy & Precautions, H&P , NPO status , Patient's Chart, lab work & pertinent test results  Airway Mallampati: II  TM Distance: >3 FB Neck ROM: Full    Dental no notable dental hx. (+) Teeth Intact, Dental Advisory Given   Pulmonary asthma , former smoker,    Pulmonary exam normal breath sounds clear to auscultation       Cardiovascular negative cardio ROS   Rhythm:Regular Rate:Normal     Neuro/Psych negative neurological ROS  negative psych ROS   GI/Hepatic negative GI ROS, Neg liver ROS,   Endo/Other  negative endocrine ROS  Renal/GU negative Renal ROS  negative genitourinary   Musculoskeletal   Abdominal   Peds  Hematology negative hematology ROS (+)   Anesthesia Other Findings   Reproductive/Obstetrics negative OB ROS                            Anesthesia Physical Anesthesia Plan  ASA: 2  Anesthesia Plan: General   Post-op Pain Management:    Induction: Intravenous  PONV Risk Score and Plan: 4 or greater and Ondansetron, Dexamethasone and Midazolam  Airway Management Planned: LMA  Additional Equipment:   Intra-op Plan:   Post-operative Plan: Extubation in OR  Informed Consent: I have reviewed the patients History and Physical, chart, labs and discussed the procedure including the risks, benefits and alternatives for the proposed anesthesia with the patient or authorized representative who has indicated his/her understanding and acceptance.     Dental advisory given  Plan Discussed with: CRNA  Anesthesia Plan Comments:         Anesthesia Quick Evaluation

## 2021-10-24 NOTE — Anesthesia Procedure Notes (Signed)
Procedure Name: LMA Insertion Date/Time: 10/24/2021 3:25 PM Performed by: Verita Lamb, CRNA Pre-anesthesia Checklist: Patient identified, Emergency Drugs available, Suction available and Patient being monitored Patient Re-evaluated:Patient Re-evaluated prior to induction Oxygen Delivery Method: Circle system utilized Preoxygenation: Pre-oxygenation with 100% oxygen Induction Type: IV induction Ventilation: Mask ventilation without difficulty LMA: LMA inserted LMA Size: 4.0 Number of attempts: 1 Airway Equipment and Method: Bite block Placement Confirmation: positive ETCO2 Tube secured with: Tape Dental Injury: Teeth and Oropharynx as per pre-operative assessment

## 2021-10-24 NOTE — Research (Signed)
[id number] - TREATMENT OF REFRACTORY NAUSEA  Patient ARIYONA EID was identified by MD Lindi Adie as a potential candidate for the above listed study.  This Clinical Research Nurse met with ARIEA ROCHIN, [id number], on 10/24/21 in a manner and location that ensures patient privacy to discuss participation in the above listed research study.  Patient is Unaccompanied.  A copy of the informed consent document and separate HIPAA Authorization was provided to the patient.  Patient reads, speaks, and understands [place].   Patient was provided with the business card of this Nurse and encouraged to contact the research team with any questions.  Approximately 30 minutes were spent with the patient reviewing the informed consent documents.  Patient was provided the option of taking informed consent documents home to review and was encouraged to review at their convenience with their support network, including other care providers. Patient took the consent documents home to review.  Will contact pt before end of week to determine interest in study.  Pt aware to contact Nurse before then with any questions/concerns.  Wells Guiles 'Learta CoddingNeysa Bonito, RN, BSN Clinical Research Nurse I 10/24/21 12:00 PM

## 2021-10-24 NOTE — Discharge Instructions (Addendum)
  PORT-A-CATH: POST OP INSTRUCTIONS  Always review your discharge instruction sheet given to you by the facility where your surgery was performed.   A prescription for pain medication may be given to you upon discharge. Take your pain medication as prescribed, if needed. If narcotic pain medicine is not needed, then you make take acetaminophen (Tylenol) or ibuprofen (Advil) as needed.  Take your usually prescribed medications unless otherwise directed. If you need a refill on your pain medication, please contact our office. All narcotic pain medicine now requires a paper prescription.  Phoned in and fax refills are no longer allowed by law.  Prescriptions will not be filled after 5 pm or on weekends.  You should follow a light diet for the remainder of the day after your procedure. Most patients will experience some mild swelling and/or bruising in the area of the incision. It may take several days to resolve. It is common to experience some constipation if taking pain medication after surgery. Increasing fluid intake and taking a stool softener (such as Colace) will usually help or prevent this problem from occurring. A mild laxative (Milk of Magnesia or Miralax) should be taken according to package directions if there are no bowel movements after 48 hours.  Unless discharge instructions indicate otherwise, you may remove your bandages 48 hours after surgery, and you may shower at that time. You may have steri-strips (small white skin tapes) in place directly over the incision.  These strips should be left on the skin for 7-10 days.  If your surgeon used Dermabond (skin glue) on the incision, you may shower in 24 hours.  The glue will flake off over the next 2-3 weeks.  If your port is left accessed at the end of surgery (needle left in port), the dressing cannot get wet and should only by changed by a healthcare professional. When the port is no longer accessed (when the needle has been removed),  follow step 7.   ACTIVITIES:  Limit activity involving your arms for the next 72 hours. Do no strenuous exercise or activity for 1 week. You may drive when you are no longer taking prescription pain medication, you can comfortably wear a seatbelt, and you can maneuver your car. 10.You may need to see your doctor in the office for a follow-up appointment.  Please       check with your doctor.  11.When you receive a new Port-a-Cath, you will get a product guide and        ID card.  Please keep them in case you need them.  WHEN TO CALL YOUR DOCTOR ([phone]): Fever over 101.0 Chills Continued bleeding from incision Increased redness and tenderness at the site Shortness of breath, difficulty breathing   The clinic staff is available to answer your questions during regular business hours. Please don't hesitate to call and ask to speak to one of the nurses or medical assistants for clinical concerns. If you have a medical emergency, go to the nearest emergency room or call 911.  A surgeon from [facility] is always on call at the hospital.     For further information, please visit [email or web address]   Post Anesthesia Home Care Instructions  Activity: Get plenty of rest for the remainder of the day. A responsible individual must stay with you for 24 hours following the procedure.  For the next 24 hours, DO NOT: -Drive a car -Operate machinery -Drink alcoholic beverages -Take any medication unless instructed by your physician -Make   any legal decisions or sign important papers.  Meals: Start with liquid foods such as gelatin or soup. Progress to regular foods as tolerated. Avoid greasy, spicy, heavy foods. If nausea and/or vomiting occur, drink only clear liquids until the nausea and/or vomiting subsides. Call your physician if vomiting continues.  Special Instructions/Symptoms: Your throat may feel dry or sore from the anesthesia or the breathing tube placed  in your throat during surgery. If this causes discomfort, gargle with warm salt water. The discomfort should disappear within 24 hours.  If you had a scopolamine patch placed behind your ear for the management of post- operative nausea and/or vomiting:  1. The medication in the patch is effective for 72 hours, after which it should be removed.  Wrap patch in a tissue and discard in the trash. Wash hands thoroughly with soap and water. 2. You may remove the patch earlier than 72 hours if you experience unpleasant side effects which may include dry mouth, dizziness or visual disturbances. 3. Avoid touching the patch. Wash your hands with soap and water after contact with the patch.         

## 2021-10-24 NOTE — Anesthesia Postprocedure Evaluation (Signed)
Anesthesia Post Note  Patient: Alicia Morton  Procedure(s) Performed: INSERTION PORT-A-CATH (Right: Chest)     Patient location during evaluation: PACU Anesthesia Type: General Level of consciousness: awake and alert Pain management: pain level controlled Vital Signs Assessment: post-procedure vital signs reviewed and stable Respiratory status: spontaneous breathing, nonlabored ventilation, respiratory function stable and patient connected to nasal cannula oxygen Cardiovascular status: blood pressure returned to baseline and stable Postop Assessment: no apparent nausea or vomiting Anesthetic complications: no   No notable events documented.  Last Vitals:  Vitals:   10/24/21 1530 10/24/21 1545  BP: (!) 159/74   Pulse: 68   Resp: 15   Temp:    SpO2: 99% 99%    Last Pain:  Vitals:   10/24/21 1545  TempSrc:   PainSc: 0-No pain                 Barnet Glasgow

## 2021-10-24 NOTE — Progress Notes (Signed)
START ON PATHWAY REGIMEN - Breast     Cycles 1 through 4: A cycle is every 14 days:     Doxorubicin      Cyclophosphamide      Pegfilgrastim-xxxx    Cycles 5 through 16: A cycle is every 7 days:     Paclitaxel   **Always confirm dose/schedule in your pharmacy ordering system**  Patient Characteristics: Preoperative or Nonsurgical Candidate (Clinical Staging), Neoadjuvant Therapy followed by Surgery, Invasive Disease, Chemotherapy, HER2 Negative/Unknown/Equivocal, ER Positive Therapeutic Status: Preoperative or Nonsurgical Candidate (Clinical Staging) AJCC M Category: cM0 AJCC Grade: G3 Breast Surgical Plan: Neoadjuvant Therapy followed by Surgery ER Status: Positive (+) AJCC 8 Stage Grouping: IIIB HER2 Status: Negative (-) AJCC T Category: cT3 AJCC N Category: cN1 PR Status: Negative (-) Intent of Therapy: Curative Intent, Discussed with Patient 

## 2021-10-24 NOTE — Op Note (Signed)
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 She presents with swelling and mass in the left breast which had been present for 2 months. Diagnostic mammogram and [place] on 09/28/2021 showed a large masslike asymmetry involving upper outer and central left breast measuring approximately 9 cm and suspicious calcifications in the upper outer left breast spanning approximately 1.8 cm. Biopsy of two separate areas in this 9 cm area  on 10/04/2021 showed invasive ductal carcinoma Grade 3 with one lymph node involved, ER+(95%)/PR-/Her2-, Ki67 40%.  Five lymph nodes appeared abnormal. Neoadjuvant chemotherapy is planned for the patient.  Description of procedure: The patient is brought to the operating room placed in the supine position on the operating table.  After an adequate level of general anesthesia was obtained, the patient right arm was tucked at her side.  Her right chest and neck were prepped with ChloraPrep and draped sterile fashion.  A timeout was taken to ensure the proper patient and proper procedure.  She was placed in Trendelenburg position.  We interrogated her neck with the ultrasound.  The jugular vein is easily identified.  Using ultrasound guidance we directly cannulated the internal jugular vein with good blood return.  The wire passed easily.  Fluoroscopy confirmed that the wire headed down the right side of the mediastinum.  The needle was removed.  We created a subcutaneous pocket below the right clavicle.  We first anesthetized with local anesthetic.  We created a subcutaneous tunnel from the subcutaneous pocket to the insertion site on the neck.  An 8 French [place] port was assembled and was tunneled from the  subcutaneous pocket to the insertion site.  The catheter was cut to the appropriate length using fluoroscopic guidance.  Using fluoroscopic guidance, we passed the dilator and breakaway sheath over the wire.  The wire and dilator were removed.  The catheter was then advanced through the sheath which was removed.  Fluoroscopy confirmed that there were no kinks along the length of the catheter.  We are able to aspirate blood easily through the port and were able to flush easily.  The port was secured with two interrupted 2-0 Prolene sutures.  3-0 Vicryl was used to close the subcutaneous tissue and 4-0 Monocryl was used to close the skin at both sites.  Benzoin and Steri-Strips were applied.  An occlusive dressing was placed.  The patient was then extubated and brought to the recovery room in stable condition.  All sponge, instrument, and needle counts are correct.  Post-op chest x-ray is pending.  Imogene Burn. Georgette Dover, MD, [facility]  General Surgery   10/24/2021 3:23 PM

## 2021-10-24 NOTE — Interval H&P Note (Signed)
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  The various methods of treatment have been discussed with the patient and family. After consideration of risks, benefits and other options for treatment, the patient has consented to  Procedure(s): INSERTION PORT-A-CATH (N/A) as a surgical intervention.  The patient's history has been reviewed, patient examined, no change in status, stable for surgery.  I have reviewed the patient's chart and labs.  Questions were answered to the patient's satisfaction.     Maia Petties

## 2021-10-24 NOTE — Assessment & Plan Note (Signed)
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 Bone scan 10/23/2021: No evidence of bony metastases.  Left mandible activity benign  Treatment plan: 1.  Neoadjuvant chemotherapy with dose dense Adriamycin and Cytoxan followed by Taxol 2. modified radical mastectomy 3.  Adjuvant radiation 4.  Adjuvant antiestrogen therapy with ovarian function suppression plus + AI + Verzinio  I instructed the patient to stop tamoxifen Plan: 1.  Port placement 2. Echocardiogram 3.  Chemo class 4.  Nausea study  Return to clinic to start chemotherapy

## 2021-10-25 ENCOUNTER — Encounter: Payer: Self-pay | Admitting: *Deleted

## 2021-10-25 ENCOUNTER — Other Ambulatory Visit: Payer: Self-pay

## 2021-10-25 ENCOUNTER — Encounter: Payer: Self-pay | Admitting: Hematology and Oncology

## 2021-10-25 ENCOUNTER — Telehealth: Payer: Self-pay | Admitting: *Deleted

## 2021-10-25 NOTE — Telephone Encounter (Signed)
Also patient requested her prescriptions be transferred to [facility]. I have called them and they are reaching out to [facility] to get them transferred.

## 2021-10-25 NOTE — Telephone Encounter (Signed)
Spoke with patient to see if starting chemo 11/16 will work for schedule.  She states she will make it work.  She wants to get started.  Confirmed appointments for echo chemo class, chemo appts. Encouraged patient to call should she have any questions or concerns.

## 2021-10-25 NOTE — Progress Notes (Signed)
Pharmacist Chemotherapy Monitoring - Initial Assessment    Anticipated start date: 11/01/21   The following has been reviewed per standard work regarding the patient's treatment regimen: The patient's diagnosis, treatment plan and drug doses, and organ/hematologic function Lab orders and baseline tests specific to treatment regimen  The treatment plan start date, drug sequencing, and pre-medications Prior authorization status  Patient's documented medication list, including drug-drug interaction screen and prescriptions for anti-emetics and supportive care specific to the treatment regimen The drug concentrations, fluid compatibility, administration routes, and timing of the medications to be used The patient's access for treatment and lifetime cumulative dose history, if applicable  The patient's medication allergies and previous infusion related reactions, if applicable   Changes made to treatment plan:  treatment plan date  Follow up needed:  N/A   Judge Stall, [facility], 10/25/2021  2:04 PM

## 2021-10-26 ENCOUNTER — Encounter (HOSPITAL_BASED_OUTPATIENT_CLINIC_OR_DEPARTMENT_OTHER): Payer: Self-pay | Admitting: Surgery

## 2021-10-26 ENCOUNTER — Telehealth: Payer: Self-pay | Admitting: Emergency Medicine

## 2021-10-26 NOTE — Telephone Encounter (Signed)
[id number] - TREATMENT OF REFRACTORY NAUSEA  Contacted pt to f/u on potential interest in study.  Pt states she needs more time to consider and requested f/u call on Monday morning.  Will call her then.  Pt has Research contact info in meantime.  Wells Guiles 'Learta CoddingNeysa Bonito, RN, BSN Clinical Research Nurse I 10/26/21 11:06 AM

## 2021-10-27 ENCOUNTER — Other Ambulatory Visit: Payer: Self-pay

## 2021-10-27 ENCOUNTER — Ambulatory Visit (HOSPITAL_COMMUNITY)
Admission: RE | Admit: 2021-10-27 | Discharge: 2021-10-27 | Disposition: A | Discharge status: Home / Self Care | Payer: Medicaid Other | Source: Ambulatory Visit | Attending: Hematology and Oncology | Admitting: Hematology and Oncology

## 2021-10-27 DIAGNOSIS — C50412 Malignant neoplasm of upper-outer quadrant of left female breast: Secondary | ICD-10-CM | POA: Diagnosis not present

## 2021-10-27 DIAGNOSIS — Z170 Estrogen receptor positive status [ER+]: Secondary | ICD-10-CM | POA: Diagnosis not present

## 2021-10-27 DIAGNOSIS — Z01818 Encounter for other preprocedural examination: Secondary | ICD-10-CM | POA: Insufficient documentation

## 2021-10-27 DIAGNOSIS — Z0189 Encounter for other specified special examinations: Secondary | ICD-10-CM

## 2021-10-27 LAB — ECHOCARDIOGRAM COMPLETE
Area-P 1/2: 2.87 cm2
S' Lateral: 3.4 cm

## 2021-10-27 NOTE — Progress Notes (Signed)
  Echocardiogram 2D Echocardiogram has been performed.  Fidel Levy 10/27/2021, 9:01 AM

## 2021-10-30 ENCOUNTER — Telehealth: Payer: Self-pay | Admitting: Emergency Medicine

## 2021-10-30 ENCOUNTER — Other Ambulatory Visit: Payer: Self-pay | Admitting: Emergency Medicine

## 2021-10-30 DIAGNOSIS — C50412 Malignant neoplasm of upper-outer quadrant of left female breast: Secondary | ICD-10-CM

## 2021-10-30 NOTE — Telephone Encounter (Signed)
[id number] - TREATMENT OF REFRACTORY NAUSEA  Called pt to f/u on potential interest in study, pt reports continued interest.  Pt agreed to come in at 2:30 pm tomorrow (10/31/21) to sign consent/enroll in study.  Pt aware to call back before then with any questions/concerns.  Wells Guiles 'Learta CoddingNeysa Bonito, RN, BSN Clinical Research Nurse I 10/30/21 10:54 AM

## 2021-10-31 ENCOUNTER — Encounter: Payer: Self-pay | Admitting: Hematology and Oncology

## 2021-10-31 ENCOUNTER — Other Ambulatory Visit: Payer: Self-pay

## 2021-10-31 ENCOUNTER — Inpatient Hospital Stay: Payer: Medicaid Other

## 2021-10-31 ENCOUNTER — Inpatient Hospital Stay: Payer: Medicaid Other | Admitting: Emergency Medicine

## 2021-10-31 DIAGNOSIS — Z170 Estrogen receptor positive status [ER+]: Secondary | ICD-10-CM

## 2021-10-31 DIAGNOSIS — Z5111 Encounter for antineoplastic chemotherapy: Secondary | ICD-10-CM | POA: Diagnosis not present

## 2021-10-31 DIAGNOSIS — C50412 Malignant neoplasm of upper-outer quadrant of left female breast: Secondary | ICD-10-CM

## 2021-10-31 LAB — CBC WITH DIFFERENTIAL (CANCER CENTER ONLY)
Abs Immature Granulocytes: 0.02 10*3/uL (ref 0.00–0.07)
Basophils Absolute: 0.1 10*3/uL (ref 0.0–0.1)
Basophils Relative: 1 %
Eosinophils Absolute: 0.3 10*3/uL (ref 0.0–0.5)
Eosinophils Relative: 3 %
HCT: 36.9 % (ref 36.0–46.0)
Hemoglobin: 11.2 g/dL — ABNORMAL LOW (ref 12.0–15.0)
Immature Granulocytes: 0 %
Lymphocytes Relative: 27 %
Lymphs Abs: 2.6 10*3/uL (ref 0.7–4.0)
MCH: 26.7 pg (ref 26.0–34.0)
MCHC: 30.4 g/dL (ref 30.0–36.0)
MCV: 88.1 fL (ref 80.0–100.0)
Monocytes Absolute: 0.7 10*3/uL (ref 0.1–1.0)
Monocytes Relative: 7 %
Neutro Abs: 6.2 10*3/uL (ref 1.7–7.7)
Neutrophils Relative %: 62 %
Platelet Count: 387 10*3/uL (ref 150–400)
RBC: 4.19 MIL/uL (ref 3.87–5.11)
RDW: 14.5 % (ref 11.5–15.5)
WBC Count: 9.9 10*3/uL (ref 4.0–10.5)
nRBC: 0 % (ref 0.0–0.2)

## 2021-10-31 LAB — CMP (CANCER CENTER ONLY)
ALT: 8 U/L (ref 0–44)
AST: 14 U/L — ABNORMAL LOW (ref 15–41)
Albumin: 3.7 g/dL (ref 3.5–5.0)
Alkaline Phosphatase: 69 U/L (ref 38–126)
Anion gap: 9 (ref 5–15)
BUN: 9 mg/dL (ref 6–20)
CO2: 25 mmol/L (ref 22–32)
Calcium: 9 mg/dL (ref 8.9–10.3)
Chloride: 105 mmol/L (ref 98–111)
Creatinine: 0.74 mg/dL (ref 0.44–1.00)
GFR, Estimated: >60 mL/min (ref >60)
Glucose, Bld: 82 mg/dL (ref 70–99)
Potassium: 3.4 mmol/L — ABNORMAL LOW (ref 3.5–5.1)
Sodium: 139 mmol/L (ref 135–145)
Total Bilirubin: 0.3 mg/dL (ref 0.3–1.2)
Total Protein: 8.5 g/dL — ABNORMAL HIGH (ref 6.5–8.1)

## 2021-10-31 LAB — PREGNANCY, URINE: Preg Test, Ur: NEGATIVE

## 2021-10-31 LAB — RESEARCH LABS

## 2021-10-31 MED FILL — Fosaprepitant Dimeglumine For IV Infusion 150 MG (Base Eq): INTRAVENOUS | Qty: 5 | Status: AC

## 2021-10-31 MED FILL — Dexamethasone Sodium Phosphate Inj 100 MG/10ML: INTRAMUSCULAR | Qty: 1 | Status: AC

## 2021-10-31 NOTE — Research (Signed)
UR[facility] 16070 Treatment of Refractory Nausea  Second Eligibility Check:  Independently reviewed protocol criteria and patient's history, medications, treatment plans, demographics, and other relevant information. Patient meets criteria for study enrollment.   Marjie Skiff Ariona Deschene, RN, BSN, [facility] [phone]  Pager [phone] 10/31/2021 3:29 PM

## 2021-10-31 NOTE — Research (Signed)
[id number] - TREATMENT OF REFRACTORY NAUSEA  This Nurse has reviewed this patient's inclusion and exclusion criteria and confirmed Alicia Morton is eligible for study participation.  Patient will continue with enrollment.  Menopausal status (women only): Alicia Morton is pre-menopausal and patient utilizes no contraception, however patient is in committed same-sex relationship.  Pt is aware to not become pregnant during trial and has agreed to continue using this form of contraception.  Confirmed with patient that she is able to give informed consent today.  Reviewed medications and allergies with patient today.  Pt denies any allergies.  Pt denies taking any medications besides ones listed.  Vidocin prescription removed as pt took only one dose on 10/24/21 (day of prescription and day of port insertion) and has not taken any since.  Reviewed all Cycle 1 exclusion criteria with patient who verbally denied all.  Pregnancy test negative.  Wells Guiles 'Learta CoddingNeysa Bonito, RN, BSN Clinical Research Nurse I 10/31/21 4:28 PM

## 2021-10-31 NOTE — Progress Notes (Signed)
Met with patient at registration to introduce myself as Arboriculturist and to offer available resources.  Discussed one-time $1000 Radio broadcast assistant to assist with personal expenses while going through treatment.  Gave my card if interested in applying and for any additional financial questions or concerns.

## 2021-10-31 NOTE — Research (Signed)
[id number] - TREATMENT OF REFRACTORY NAUSEA  Patient Alicia Morton, was registered to the above listed study, assigned study#[id number]. Randomization is not required for this study for Cycle 1 ONLY.  Wells Guiles 'Learta CoddingNeysa Bonito, RN, BSN Clinical Research Nurse I 10/31/21 4:29 PM

## 2021-10-31 NOTE — Research (Signed)
[id number] - TREATMENT OF REFRACTORY NAUSEA  Patient Alicia Morton was identified by MD Lindi Adie as a potential candidate for the above listed study.  This Clinical Research Nurse along with CRN [facility] met with Alicia Morton, [id number] on 10/31/21 in a manner and location that ensures patient privacy to discuss participation in the above listed research study.  Patient is Accompanied by spouse [name] .  Patient was previously provided with informed consent documents.  Patient has not yet read the informed consent documents and so documents were reviewed page by page today.  As outlined in the informed consent form, this Nurse and [name] discussed the purpose of the research study, the investigational nature of the study, study procedures and requirements for study participation, potential risks and benefits of study participation, as well as alternatives to participation.  This study is blinded or double-blinded. The patient understands participation is voluntary and they may withdraw from study participation at any time.  Each study arm was reviewed, and randomization discussed.  Potential side effects were reviewed with patient as outlined in the consent form, and patient made aware there may be side effects not yet known. The chance of receiving placebo was discussed. Patient understands enrollment is pending full eligibility review.   Confidentiality and how the patient's information will be used as part of study participation were discussed.  Patient was informed there is not reimbursement provided for their time and effort spent on trial participation.  The patient is encouraged to discuss research study participation with their insurance provider to determine what costs they may incur as part of study participation, including research related injury.    All questions were answered to patient's satisfaction.  The informed consent and separate HIPAA Authorization was reviewed  page by page.  The patient's mental and emotional status is appropriate to provide informed consent, and the patient verbalizes an understanding of study participation.  Patient has agreed to participate in the above listed research study and has voluntarily signed the informed consent version date 07/03/19 ([facility] active date 04/12/20)  and separate HIPAA Authorization, version date 03/04/17 ([facility] active date 06/27/21)  on 10/31/21 at 3:10PM.  The patient was provided with a copy of the signed informed consent form and separate HIPAA Authorization for their reference.  No study specific procedures were obtained prior to the signing of the informed consent document.  Approximately 30 minutes were spent with the patient reviewing the informed consent documents.  Patient was not requested to complete a Release of Information form.  Patient has agreed to complete PROs in [employer] via email, confirmed correct email address with pt.  Pt also provided with back up paper versions of Baseline PROs and was informed to complete these if she does not receive the email surveys by tomorrow morning (11/01/21) before she starts chemo.  Pt will be followed up with then by RN Cristie Hem and RN Cameo W to check on completion status and to provide paper backup versions of Cycle 1 questionnaires and Cycle 1 Four day calendar.  Wells Guiles 'Learta CoddingNeysa Bonito, RN, BSN Clinical Research Nurse I 10/31/21 4:10 PM

## 2021-11-01 ENCOUNTER — Encounter: Payer: Self-pay | Admitting: *Deleted

## 2021-11-01 ENCOUNTER — Inpatient Hospital Stay: Payer: Medicaid Other

## 2021-11-01 VITALS — BP 127/50 | HR 67 | Temp 98.4°F | Resp 18 | Wt 229.5 lb

## 2021-11-01 DIAGNOSIS — Z170 Estrogen receptor positive status [ER+]: Secondary | ICD-10-CM

## 2021-11-01 DIAGNOSIS — C50412 Malignant neoplasm of upper-outer quadrant of left female breast: Secondary | ICD-10-CM

## 2021-11-01 DIAGNOSIS — Z5111 Encounter for antineoplastic chemotherapy: Secondary | ICD-10-CM | POA: Diagnosis not present

## 2021-11-01 MED ORDER — SODIUM CHLORIDE 0.9% FLUSH
10.0000 mL | INTRAVENOUS | Status: DC | PRN
  Administered 2021-11-01: 10 mL

## 2021-11-01 MED ORDER — SODIUM CHLORIDE 0.9 % IV SOLN
150.0000 mg | Freq: Once | INTRAVENOUS | Status: AC
  Administered 2021-11-01: 150 mg via INTRAVENOUS
  Filled 2021-11-01: qty 150

## 2021-11-01 MED ORDER — SODIUM CHLORIDE 0.9 % IV SOLN
600.0000 mg/m2 | Freq: Once | INTRAVENOUS | Status: AC
  Administered 2021-11-01: 1360 mg via INTRAVENOUS
  Filled 2021-11-01: qty 68

## 2021-11-01 MED ORDER — HEPARIN SOD (PORK) LOCK FLUSH 100 UNIT/ML IV SOLN
500.0000 [IU] | Freq: Once | INTRAVENOUS | Status: AC | PRN
  Administered 2021-11-01: 500 [IU]

## 2021-11-01 MED ORDER — SODIUM CHLORIDE 0.9 % IV SOLN
10.0000 mg | Freq: Once | INTRAVENOUS | Status: AC
  Administered 2021-11-01: 10 mg via INTRAVENOUS
  Filled 2021-11-01: qty 10

## 2021-11-01 MED ORDER — SODIUM CHLORIDE 0.9 % IV SOLN: Freq: Once | INTRAVENOUS | Status: AC

## 2021-11-01 MED ORDER — DOXORUBICIN HCL CHEMO IV INJECTION 2 MG/ML
60.0000 mg/m2 | Freq: Once | INTRAVENOUS | Status: AC
  Administered 2021-11-01: 136 mg via INTRAVENOUS
  Filled 2021-11-01: qty 68

## 2021-11-01 MED ORDER — PALONOSETRON HCL INJECTION 0.25 MG/5ML
0.2500 mg | Freq: Once | INTRAVENOUS | Status: AC
  Administered 2021-11-01: 0.25 mg via INTRAVENOUS
  Filled 2021-11-01: qty 5

## 2021-11-01 NOTE — Research (Addendum)
EDIT: research notes for this visit are now associated with her research encounter.   Marjie Skiff Kimmi Acocella, RN, BSN, [place] Direct Dial [phone]  Pager [phone] 11/02/2021 2:08 PM

## 2021-11-01 NOTE — Patient Instructions (Signed)
[facility] MEDICAL ONCOLOGY  Discharge Instructions: Thank you for choosing [facility] to provide your oncology and hematology care.   If you have a lab appointment with the Cancer Center, please go directly to the Cancer Center and check in at the registration area.   Wear comfortable clothing and clothing appropriate for easy access to any Portacath or PICC line.   We strive to give you quality time with your provider. You may need to reschedule your appointment if you arrive late (15 or more minutes).  Arriving late affects you and other patients whose appointments are after yours.  Also, if you miss three or more appointments without notifying the office, you may be dismissed from the clinic at the provider's discretion.      For prescription refill requests, have your pharmacy contact our office and allow 72 hours for refills to be completed.    Today you received the following chemotherapy and/or immunotherapy agents adriamycin, cytoxan      To help prevent nausea and vomiting after your treatment, we encourage you to take your nausea medication as directed.  BELOW ARE SYMPTOMS THAT SHOULD BE REPORTED IMMEDIATELY: *FEVER GREATER THAN 100.4 F (38 C) OR HIGHER *CHILLS OR SWEATING *NAUSEA AND VOMITING THAT IS NOT CONTROLLED WITH YOUR NAUSEA MEDICATION *UNUSUAL SHORTNESS OF BREATH *UNUSUAL BRUISING OR BLEEDING *URINARY PROBLEMS (pain or burning when urinating, or frequent urination) *BOWEL PROBLEMS (unusual diarrhea, constipation, pain near the anus) TENDERNESS IN MOUTH AND THROAT WITH OR WITHOUT PRESENCE OF ULCERS (sore throat, sores in mouth, or a toothache) UNUSUAL RASH, SWELLING OR PAIN  UNUSUAL VAGINAL DISCHARGE OR ITCHING   Items with * indicate a potential emergency and should be followed up as soon as possible or go to the Emergency Department if any problems should occur.  Please show the CHEMOTHERAPY ALERT CARD or IMMUNOTHERAPY ALERT CARD at  check-in to the Emergency Department and triage nurse.  Should you have questions after your visit or need to cancel or reschedule your appointment, please contact [facility]  Dept: [phone]  and follow the prompts.  Office hours are 8:00 a.m. to 4:30 p.m. Monday - Friday. Please note that voicemails left after 4:00 p.m. may not be returned until the following business day.  We are closed weekends and major holidays. You have access to a nurse at all times for urgent questions. Please call the main number to the clinic Dept: [phone] and follow the prompts.   For any non-urgent questions, you may also contact your provider using MyChart. We now offer e-Visits for anyone 18 and older to request care online for non-urgent symptoms. For details visit [email or web address].   Also download the MyChart app! Go to the app store, search "MyChart", open the app, select [facility], and log in with your MyChart username and password.  Due to Covid, a mask is required upon entering the hospital/clinic. If you do not have a mask, one will be given to you upon arrival. For doctor visits, patients may have 1 support person aged 18 or older with them. For treatment visits, patients cannot have anyone with them due to current Covid guidelines and our immunocompromised population.   

## 2021-11-01 NOTE — Research (Addendum)
Research notes are now associated with the research visit for the day.  Marjie Skiff Bertrum Helmstetter, RN, BSN, [place] Direct Dial [phone]  Pager [phone] 11/02/2021 2:09 PM

## 2021-11-01 NOTE — Progress Notes (Signed)
..  Patient is receiving Assistance Medication - Supplied Externally. Medication: Udenyca (pegfligrastim -cbqv) Manufacture: [employer] Approval Dates: Approved from 11/01/2021 until 11/01/2022. ID: [id number] Reason: Self Pay First DOS: 11/03/2021.  Marland KitchenJuan Morton, CPhT IV Drug Replacement Specialist [place] Phone: [phone]

## 2021-11-02 ENCOUNTER — Encounter: Payer: Self-pay | Admitting: Hematology and Oncology

## 2021-11-02 NOTE — Research (Signed)
DCP-001: Use of a Clinical Trial Screening Tool to Address Cancer Health Disparities in the [place] Program [facility])   Trial:  SYS-573 Patient Alicia Morton was identified by Dr Lindi Adie as a potential candidate for the above listed study.  This Clinical Research Nurse met with TEESHA OHM, [id number], on 11/01/21 in a manner and location that ensures patient privacy to discuss participation in the above listed research study.  Patient is Unaccompanied.  A copy of the informed consent document and separate HIPAA Authorization was provided to the patient.  Patient reads, speaks, and understands [place].   Patient was provided with the business card of this Nurse and encouraged to contact the research team with any questions.  Approximately 10 minutes were spent with the patient reviewing the informed consent documents.  Patient was provided the option of taking informed consent documents home to review and was encouraged to review at their convenience with their support network, including other care providers. Patient took the consent documents home to review.  Plan to follow up next week to answer questions and obtain consent. She has an appt scheduled with Dr Lindi Adie on Wednesday 11/08/21, and we agreed to meet then.  Marjie Skiff Kaid Seeberger, RN, BSN, [place] Direct Dial [phone]  Pager [phone] 11/01/2021 2:41 PM

## 2021-11-02 NOTE — Research (Signed)
[phone] Treatment of Refractory Nausea  Met with patient in infusion, and infusion RN was present in case of questions related to her current medications. Retrieved from her her baseline questionnaires on paper. She attempted the online version, but the verbiage/instructions were unclear; she prefers to use the paper versions going forward. We discussed that she will need to answer questions daily, and we went over the forms. She denied any further questions.  Agreed on plan for me to check in by phone on Monday 11/06/21, and plan to see her when she comes to see Dr Lindi Adie on Wednesday 11/08/21.   Marjie Skiff Chayden Garrelts, RN, BSN, [place] Direct Dial [phone]  Pager [phone] 11/01/2021 2:48 PM

## 2021-11-03 ENCOUNTER — Inpatient Hospital Stay: Payer: Medicaid Other

## 2021-11-03 ENCOUNTER — Other Ambulatory Visit: Payer: Self-pay

## 2021-11-03 VITALS — BP 111/52 | HR 61 | Temp 97.8°F | Resp 16

## 2021-11-03 DIAGNOSIS — Z5111 Encounter for antineoplastic chemotherapy: Secondary | ICD-10-CM | POA: Diagnosis not present

## 2021-11-03 DIAGNOSIS — Z170 Estrogen receptor positive status [ER+]: Secondary | ICD-10-CM

## 2021-11-03 MED ORDER — PEGFILGRASTIM-CBQV 6 MG/0.6ML ~~LOC~~ SOSY
6.0000 mg | PREFILLED_SYRINGE | Freq: Once | SUBCUTANEOUS | Status: AC
  Administered 2021-11-03: 6 mg via SUBCUTANEOUS
  Filled 2021-11-03: qty 0.6

## 2021-11-03 NOTE — Patient Instructions (Signed)

## 2021-11-06 ENCOUNTER — Telehealth: Payer: Self-pay

## 2021-11-06 DIAGNOSIS — C50412 Malignant neoplasm of upper-outer quadrant of left female breast: Secondary | ICD-10-CM

## 2021-11-06 NOTE — Telephone Encounter (Signed)
[facility] 16070 Treatment of Refractory Nausea Study  Attempted to reach pt for required follow up of C1D4 nausea (which fell over the weekend). No answer, left voicemail with research contact information. Plan to try to reach her again this afternoon.  Marjie Skiff Marcques Wrightsman, RN, BSN, [place] Direct Dial [phone]  Pager [phone] 11/06/2021 10:17 AM

## 2021-11-06 NOTE — Research (Signed)
[facility] 16070 Refractory Nausea Study  Spoke with Alicia Morton & her wife. Patient had some nausea on 11/01/21 (chemo day 1), but no vomiting and no nausea score of 3 or greater. Based on this, she will not be eligible to progress to the next step of the study.   Explained this; they verbalized understanding. Reminded them to bring in the questionnaires to her appt with Dr Lindi Adie on Wednesday 11/08/21 so her record can be closed out.   They reported no other AEs except for constipation. Pt is still having daily BMs, but she is now experiencing "rabbit pellet" stools. Sent message to Dr Lindi Adie, in case he wants to adjust her bowel regimen. Alicia Morton's wife states they are pushing water consumption, and she had raisins last night to try to help.  Alicia Skiff Charlie Char, RN, BSN, [place] Direct Dial [phone]  Pager [phone] 11/06/2021 3:41 PM

## 2021-11-07 ENCOUNTER — Telehealth: Payer: Self-pay | Admitting: Genetic Counselor

## 2021-11-07 ENCOUNTER — Ambulatory Visit: Payer: Self-pay | Admitting: Genetic Counselor

## 2021-11-07 DIAGNOSIS — Z1379 Encounter for other screening for genetic and chromosomal anomalies: Secondary | ICD-10-CM

## 2021-11-07 DIAGNOSIS — C50412 Malignant neoplasm of upper-outer quadrant of left female breast: Secondary | ICD-10-CM

## 2021-11-07 DIAGNOSIS — Z170 Estrogen receptor positive status [ER+]: Secondary | ICD-10-CM

## 2021-11-07 NOTE — Telephone Encounter (Signed)
Revealed negative genetic testing and variants of uncertain significance in POT1 and PRKAR1A.  Discussed that we do not know why she has breast cancer. It could be sporadic, due to a different gene that we are not testing, or maybe our current technology may not be able to pick something up.  It will be important for her to keep in contact with genetics to keep up with whether additional testing may be needed.

## 2021-11-07 NOTE — Progress Notes (Signed)
Patient Care Team: Elsie Stain, MD as PCP - General (Pulmonary Disease) Rockwell Germany, RN as Oncology Nurse Navigator Mauro Kaufmann, RN as Oncology Nurse Navigator  DIAGNOSIS:    ICD-10-CM   1. Malignant neoplasm of upper-outer quadrant of left breast in female, estrogen receptor positive ([place])  C50.412    Z17.0       SUMMARY OF ONCOLOGIC HISTORY: Oncology History  Malignant neoplasm of upper-outer quadrant of left breast in female, estrogen receptor positive ([place])  10/04/2021 Initial Diagnosis   Palpable left breast mass and left breast swelling for 2 months, mammogram revealed 9 cm abnormality at 2 o'clock position plus another mass at 8:00 measuring 1.3 cm: Biopsy of both revealed grade 3 IDC ER 95%, PR 0%, HER2 negative, Ki-67 40%, 5 abnormal lymph nodes: Biopsy of 1 was positive (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  Variants of uncertain significance detected in POT1 at c.476T>C (p.Met159Thr) and PRKAR1A at c.27T>G (p.Ser9Arg).  The report dates are October 19, 2021 and October 29, 2021, respectively.   The Multi-Cancer + RNA Panel offered by [facility] includes sequencing and/or deletion/duplication analysis of the following 84 genes:  AIP*, ALK, APC*, ATM*, AXIN2*, BAP1*, BARD1*, BLM*, BMPR1A*, BRCA1*, BRCA2*, BRIP1*, CASR, CDC73*, CDH1*, CDK4, CDKN1B*, CDKN1C*, CDKN2A, CEBPA, CHEK2*, CTNNA1*, DICER1*, DIS3L2*, EGFR, EPCAM, FH*, FLCN*, GATA2*, GPC3, GREM1, HOXB13, HRAS, KIT, MAX*, MEN1*, MET, MITF, MLH1*, MSH2*, MSH3*, MSH6*, MUTYH*, NBN*,  NF1*, NF2*, NTHL1*, PALB2*, PDGFRA, PHOX2B, PMS2*, POLD1*, POLE*, POT1*, PRKAR1A*, PTCH1*, PTEN*, RAD50*, RAD51C*, RAD51D*, RB1*, RECQL4, RET, RUNX1*, SDHA*, SDHAF2*, SDHB*, SDHC*, SDHD*, SMAD4*, SMARCA4*, SMARCB1*, SMARCE1*, STK11*, SUFU*, TERC, TERT, TMEM127*, Tp53*, TSC1*, TSC2*, VHL*, WRN*, and WT1.  RNA analysis is performed for * genes.   11/01/2021 -  Chemotherapy   Patient is on Treatment Plan : BREAST ADJUVANT DOSE DENSE AC q14d / PACLitaxel q7d       CHIEF COMPLIANT: Cycle 1 day 8 dose dense Adriamycin and Cytoxan  INTERVAL HISTORY: Alicia Morton is a 40 y.o. with above-mentioned history of left breast cancer, currently on chemotherapy with AC. She presents to the clinic today for follow-up and toxicity check.  She tolerated chemotherapy extremely well without any major side effects.  She did have mild nausea on first day but it resolved.  She did have mild constipation which also got better.  Mild fatigue.  ALLERGIES:  has No Known Allergies.  MEDICATIONS:  Current Outpatient Medications  Medication Sig Dispense Refill   albuterol (PROVENTIL) (2.5 MG/3ML) 0.083% nebulizer solution Take 3 mLs (2.5 mg total) by nebulization every 6 (six) hours as needed for wheezing or shortness of breath. 150 mL 1   albuterol (VENTOLIN HFA) 108 (90 Base) MCG/ACT inhaler Inhale 1-2 puffs into the lungs every 6 (six) hours as needed for wheezing or shortness of breath. 18 g 0   cetirizine (ZYRTEC) 10 MG tablet Take 10 mg by mouth daily.     dexamethasone (DECADRON) 4 MG tablet Take 1 tablet (4 mg total) by mouth daily. Take 1 tablet day after chemo and 1 tablet 2 days after chemo with  food 8 tablet 0   lidocaine-prilocaine (EMLA) cream Apply to affected area once 30 g 3   ondansetron (ZOFRAN) 8 MG tablet Take 1 tablet (8 mg total) by mouth 2 (two) times daily as needed. Start on the third day after chemotherapy. 30 tablet 1   prochlorperazine (COMPAZINE) 10 MG tablet Take 1 tablet (10 mg total) by  mouth every 6 (six) hours as needed (Nausea or vomiting). 30 tablet 1   tamoxifen (NOLVADEX) 20 MG tablet Take 1 tablet (20 mg total) by mouth daily. 90 tablet 3   No current facility-administered medications for this visit.    PHYSICAL EXAMINATION: ECOG PERFORMANCE STATUS: 1 - Symptomatic but completely ambulatory  Vitals:   11/08/21 1418  BP: (!) 127/51  Pulse: 81  Resp: 18  Temp: (!) 97.3 F (36.3 C)  SpO2: 100%   Filed Weights   11/08/21 1418  Weight: 231 lb (104.8 kg)     LABORATORY DATA:  I have reviewed the data as listed CMP Latest Ref Rng & Units 10/31/2021 04/15/2021  Glucose 70 - 99 mg/dL 82 95  BUN 6 - 20 mg/dL 9 10  Creatinine 4.74 - 1.00 mg/dL 2.29 4.63  Sodium 920 - 145 mmol/L 139 136  Potassium 3.5 - 5.1 mmol/L 3.4(L) 3.7  Chloride 98 - 111 mmol/L 105 105  CO2 22 - 32 mmol/L 25 22  Calcium 8.9 - 10.3 mg/dL 9.0 2.2(Z)  Total Protein 6.5 - 8.1 g/dL 7.6(Z) 7.9  Total Bilirubin 0.3 - 1.2 mg/dL 0.3 0.5  Alkaline Phos 38 - 126 U/L 69 75  AST 15 - 41 U/L 14(L) 22  ALT 0 - 44 U/L 8 13    Lab Results  Component Value Date   WBC 3.1 (L) 11/08/2021   HGB 9.8 (L) 11/08/2021   HCT 31.7 (L) 11/08/2021   MCV 86.1 11/08/2021   PLT 257 11/08/2021   NEUTROABS PENDING 11/08/2021    ASSESSMENT & PLAN:  Malignant neoplasm of upper-outer quadrant of left breast in female, estrogen receptor positive ([facility]) 10/04/2021: Palpable left breast mass and left breast swelling for 2 months, mammogram revealed 9 cm abnormality at 2 o'clock position plus another mass at 8:00 measuring 1.3 cm: 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 Bone scan 10/23/2021: No evidence of bony metastases.  Left mandible activity benign   Treatment plan: 1.  Neoadjuvant  chemotherapy with dose dense Adriamycin and Cytoxan followed by Taxol 2. modified radical mastectomy 3.  Adjuvant radiation 4.  Adjuvant antiestrogen therapy with ovarian function suppression plus + AI + Verzinio ------------------------------------------------------------------------------------------------------------------------------------------- Current treatment: Cycle 1 day 8 dose dense Adriamycin and Cytoxan Echocardiogram 10/27/2021: EF 60 to 65%   Chemo toxicities: Very mild nausea Fatigue  Return to clinic in 1 week for cycle 2    No orders of the defined types were placed in this encounter.  The patient has a good understanding of the overall plan. she agrees with it. she will call with any problems that may develop before the next visit here.  Total time spent: 30 mins including face to face time and time spent for planning, charting and coordination of care  Sabas Sous, MD, MPH 11/08/2021  I, Alda Ponder, am acting as scribe for Dr. Serena Croissant.  I have reviewed the above documentation for accuracy and completeness, and I agree with the above.

## 2021-11-07 NOTE — Progress Notes (Signed)
HPI:   Ms. Hoston was previously seen in the [facility] clinic due to a personal history of breast cancer and concerns regarding a hereditary predisposition to cancer. Please refer to our prior cancer genetics clinic note for more information regarding our discussion, assessment and recommendations, at the time. Ms. Heldman recent genetic test results were disclosed to her, as were recommendations warranted by these results. These results and recommendations are discussed in more detail below.  CANCER HISTORY:  Oncology History  Malignant neoplasm of upper-outer quadrant of left breast in female, estrogen receptor positive ([place])  10/04/2021 Initial Diagnosis   Palpable left breast mass and left breast swelling for 2 months, mammogram revealed 9 cm abnormality at 2 o'clock position plus another mass at 8:00 measuring 1.3 cm: Biopsy of both revealed grade 3 IDC ER 95%, PR 0%, HER2 negative, Ki-67 40%, 5 abnormal lymph nodes: Biopsy of 1 was positive (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  Variants of uncertain significance detected in POT1 at c.476T>C (p.Met159Thr) and PRKAR1A at c.27T>G (p.Ser9Arg).  The report dates are October 19, 2021 and October 29, 2021, respectively.   The Multi-Cancer + RNA Panel offered by [facility] includes sequencing and/or deletion/duplication analysis of the following 84 genes:  AIP*, ALK, APC*, ATM*, AXIN2*, BAP1*, BARD1*, BLM*, BMPR1A*, BRCA1*, BRCA2*, BRIP1*, CASR, CDC73*, CDH1*, CDK4, CDKN1B*, CDKN1C*, CDKN2A, CEBPA,  CHEK2*, CTNNA1*, DICER1*, DIS3L2*, EGFR, EPCAM, FH*, FLCN*, GATA2*, GPC3, GREM1, HOXB13, HRAS, KIT, MAX*, MEN1*, MET, MITF, MLH1*, MSH2*, MSH3*, MSH6*, MUTYH*, NBN*, NF1*, NF2*, NTHL1*, PALB2*, PDGFRA, PHOX2B, PMS2*, POLD1*, POLE*, POT1*, PRKAR1A*, PTCH1*, PTEN*, RAD50*, RAD51C*, RAD51D*, RB1*, RECQL4, RET, RUNX1*, SDHA*, SDHAF2*, SDHB*, SDHC*, SDHD*, SMAD4*, SMARCA4*, SMARCB1*, SMARCE1*, STK11*, SUFU*, TERC, TERT, TMEM127*, Tp53*, TSC1*, TSC2*, VHL*, WRN*, and WT1.  RNA analysis is performed for * genes.   11/01/2021 -  Chemotherapy   Patient is on Treatment Plan : BREAST ADJUVANT DOSE DENSE AC q14d / PACLitaxel q7d       FAMILY HISTORY:  We obtained a detailed, 4-generation family history.  Significant diagnoses are listed below:  Ms. Croson is unaware of previous family history of genetic testing for hereditary cancer risks. There is no reported Ashkenazi Jewish ancestry. There is no known consanguinity.    GENETIC TEST RESULTS:  The [facility] Multi-Cancer +RNA Panel found no pathogenic mutations. The Multi-Cancer + RNA Panel offered by [facility] includes sequencing and/or deletion/duplication analysis of the following 84 genes:  AIP*, ALK, APC*, ATM*, AXIN2*, BAP1*, BARD1*, BLM*, BMPR1A*, BRCA1*, BRCA2*, BRIP1*, CASR, CDC73*, CDH1*, CDK4, CDKN1B*, CDKN1C*, CDKN2A, CEBPA, CHEK2*, CTNNA1*, DICER1*, DIS3L2*, EGFR, EPCAM, FH*, FLCN*, GATA2*, GPC3, GREM1, HOXB13, HRAS, KIT, MAX*, MEN1*, MET, MITF, MLH1*, MSH2*, MSH3*, MSH6*, MUTYH*, NBN*, NF1*, NF2*, NTHL1*, PALB2*, PDGFRA, PHOX2B, PMS2*, POLD1*, POLE*, POT1*, PRKAR1A*, PTCH1*, PTEN*, RAD50*, RAD51C*, RAD51D*, RB1*, RECQL4, RET, RUNX1*, SDHA*, SDHAF2*, SDHB*, SDHC*, SDHD*, SMAD4*, SMARCA4*, SMARCB1*, SMARCE1*, STK11*, SUFU*, TERC, TERT, TMEM127*, Tp53*, TSC1*, TSC2*, VHL*, WRN*, and WT1.  RNA analysis is performed for * genes.  The test report has been scanned into EPIC and is located under the Molecular Pathology section of the Results  Review tab.  A  portion of the result report is included below for reference. Genetic testing reported out on October 29, 2021.      Genetic testing identified two variants of uncertain significance (VUS) in the POT1 gene called c.476T>C (p.Met159Thr) and in the PRKAR1A gene called c.27T>G (p.Ser9Arg).  At this time, it is unknown if these variants are associated with an increased risk for cancer or if they are benign, but most uncertain variants are reclassified to benign. It should not be used to make medical management decisions. With time, we suspect the laboratory will determine the significance of these variants, if any. If the laboratory reclassifies these variants, we will attempt to contact Ms. Huesman to discuss it further.   Even though a pathogenic variant was not identified, possible explanations for the cancer in the family may include: There may be no hereditary risk for cancer in the family. The cancer in Ms. Mccowan may be sporadic/familial or due to other genetic and environmental factors. There may be a gene mutation in one of these genes that current testing methods cannot detect but that chance is small.   Therefore, it is important to remain in touch with cancer genetics in the future so that we can continue to offer Ms. Defeo the most up to date genetic testing.   ADDITIONAL GENETIC TESTING:    We discussed with Ms. Mysliwiec that her genetic testing was fairly extensive.  If there are genes identified to increase cancer risk that can be analyzed in the future, we would be happy to discuss and coordinate this testing at that time.    CANCER SCREENING RECOMMENDATIONS:  Ms. Halvorson test result is considered negative (normal).  This means that we have not identified a hereditary cause for her personal history of breast at this time.   An individual's cancer risk and medical management are not determined by genetic test results alone. Overall cancer risk assessment incorporates additional  factors, including personal medical history, family history, and any available genetic information that may result in a personalized plan for cancer prevention and surveillance. Therefore, it is recommended she continue to follow the cancer management and screening guidelines provided by her oncology and primary healthcare provider.  RECOMMENDATIONS FOR FAMILY MEMBERS:   Since she did not inherit a identifiable mutation in a cancer predisposition gene included on this panel, her children could not have inherited a known mutation from her in one of these genes. Individuals in this family might be at some increased risk of developing cancer, over the general population risk, due to the family history of cancer.  Individuals in the family should notify their providers of the family history of cancer. We recommend women in this family have a yearly mammogram beginning at age 35, or 5 years younger than the earliest onset of cancer, an annual clinical breast exam, and perform monthly breast self-exams.     FOLLOW-UP:  Lastly, we discussed with Ms. Cisar that cancer genetics is a rapidly advancing field and it is possible that new genetic tests will be appropriate for her and/or her family members in the future. We encouraged her to remain in contact with cancer genetics on an annual basis so we can update her personal and family histories and let her know of advances in cancer genetics that may benefit this family.   Our contact number was provided. Ms. Aponte questions were answered to her satisfaction, and she knows she is welcome to call us at anytime with additional  questions or concerns.   Rhone Ozaki M. Joette Catching, [place], [facility] Genetic Counselor [email or web address] (P) [phone]

## 2021-11-08 ENCOUNTER — Inpatient Hospital Stay (HOSPITAL_BASED_OUTPATIENT_CLINIC_OR_DEPARTMENT_OTHER): Payer: Medicaid Other | Admitting: Hematology and Oncology

## 2021-11-08 ENCOUNTER — Encounter: Payer: Self-pay | Admitting: Hematology and Oncology

## 2021-11-08 ENCOUNTER — Encounter: Payer: Self-pay | Admitting: *Deleted

## 2021-11-08 ENCOUNTER — Inpatient Hospital Stay: Payer: Medicaid Other

## 2021-11-08 ENCOUNTER — Other Ambulatory Visit: Payer: Self-pay

## 2021-11-08 DIAGNOSIS — C50412 Malignant neoplasm of upper-outer quadrant of left female breast: Secondary | ICD-10-CM

## 2021-11-08 DIAGNOSIS — Z95828 Presence of other vascular implants and grafts: Secondary | ICD-10-CM

## 2021-11-08 DIAGNOSIS — Z5111 Encounter for antineoplastic chemotherapy: Secondary | ICD-10-CM | POA: Diagnosis not present

## 2021-11-08 DIAGNOSIS — Z170 Estrogen receptor positive status [ER+]: Secondary | ICD-10-CM

## 2021-11-08 LAB — CMP (CANCER CENTER ONLY)
ALT: 10 U/L (ref 0–44)
AST: 12 U/L — ABNORMAL LOW (ref 15–41)
Albumin: 3.6 g/dL (ref 3.5–5.0)
Alkaline Phosphatase: 76 U/L (ref 38–126)
Anion gap: 8 (ref 5–15)
BUN: 9 mg/dL (ref 6–20)
CO2: 25 mmol/L (ref 22–32)
Calcium: 9.1 mg/dL (ref 8.9–10.3)
Chloride: 105 mmol/L (ref 98–111)
Creatinine: 0.66 mg/dL (ref 0.44–1.00)
GFR, Estimated: >60 mL/min (ref >60)
Glucose, Bld: 73 mg/dL (ref 70–99)
Potassium: 4.1 mmol/L (ref 3.5–5.1)
Sodium: 138 mmol/L (ref 135–145)
Total Bilirubin: 0.3 mg/dL (ref 0.3–1.2)
Total Protein: 8 g/dL (ref 6.5–8.1)

## 2021-11-08 LAB — CBC WITH DIFFERENTIAL (CANCER CENTER ONLY)
Abs Immature Granulocytes: 0.01 10*3/uL (ref 0.00–0.07)
Basophils Absolute: 0.1 10*3/uL (ref 0.0–0.1)
Basophils Relative: 2 %
Eosinophils Absolute: 0.2 10*3/uL (ref 0.0–0.5)
Eosinophils Relative: 7 %
HCT: 31.7 % — ABNORMAL LOW (ref 36.0–46.0)
Hemoglobin: 9.8 g/dL — ABNORMAL LOW (ref 12.0–15.0)
Immature Granulocytes: 0 %
Lymphocytes Relative: 57 %
Lymphs Abs: 1.8 10*3/uL (ref 0.7–4.0)
MCH: 26.6 pg (ref 26.0–34.0)
MCHC: 30.9 g/dL (ref 30.0–36.0)
MCV: 86.1 fL (ref 80.0–100.0)
Monocytes Absolute: 0.3 10*3/uL (ref 0.1–1.0)
Monocytes Relative: 11 %
Neutro Abs: 0.7 10*3/uL — ABNORMAL LOW (ref 1.7–7.7)
Neutrophils Relative %: 23 %
Platelet Count: 257 10*3/uL (ref 150–400)
RBC: 3.68 MIL/uL — ABNORMAL LOW (ref 3.87–5.11)
RDW: 13.8 % (ref 11.5–15.5)
Smear Review: NORMAL
WBC Count: 3.1 10*3/uL — ABNORMAL LOW (ref 4.0–10.5)
nRBC: 0 % (ref 0.0–0.2)

## 2021-11-08 MED ORDER — HEPARIN SOD (PORK) LOCK FLUSH 100 UNIT/ML IV SOLN
500.0000 [IU] | Freq: Once | INTRAVENOUS | Status: AC
  Administered 2021-11-08: 500 [IU] via INTRAVENOUS

## 2021-11-08 MED ORDER — SODIUM CHLORIDE 0.9% FLUSH
10.0000 mL | INTRAVENOUS | Status: DC | PRN
  Administered 2021-11-08: 10 mL via INTRAVENOUS

## 2021-11-08 NOTE — Research (Signed)
[facility] 85995: Refractory Nausea Study:  Met with patient and her wife Donell Sievert, [place] present to supervise); she returned the 4 day home record for Cycle 1. However, remaining PROs were not returned; pt was provided the forms, and she completed them during her visit.  Confirmed no nausea rated 3 or above since treatment. Patient will not be progressing to second part of the study.   Patient and her wife deny questions or concerns, and they retained the contact information for the research team in case they do have questions later.  Marjie Skiff Raiven Belizaire, RN, BSN, [place] Direct Dial [phone]  Pager [phone] 11/08/2021 3:04 PM

## 2021-11-08 NOTE — Assessment & Plan Note (Signed)
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 Bone scan 10/23/2021: No evidence of bony metastases.  Left mandible activity benign  Treatment plan: 1.  Neoadjuvant chemotherapy with dose dense Adriamycin and Cytoxan followed by Taxol 2. modified radical mastectomy 3.  Adjuvant radiation 4.  Adjuvant antiestrogen therapy with ovarian function suppression plus + AI + Verzinio ------------------------------------------------------------------------------------------------------------------------------------------- Current treatment: Cycle 1 dose dense Adriamycin and Cytoxan Echocardiogram 10/27/2021: EF 60 to 65% Labs reviewed, chemo education completed, chemo consent obtained Antiemetics were reviewed Return to clinic in 1 week for toxicity check

## 2021-11-08 NOTE — Research (Signed)
Trial: [id number]  Patient Alicia Morton was identified by this nurse (reviewed by Donell Sievert, RN) as a potential candidate for the above listed study.  This Clinical Research Nurse met with [name], [id number], on 11/08/21 in a manner and location that ensures patient privacy to discuss participation in the above listed research study; Donell Sievert was present to supervise.  Patient is Accompanied by her wife .  A copy of the informed consent document and separate HIPAA Authorization was provided to the patient.  Patient reads, speaks, and understands [place].    Patient was provided with the business card of this Nurse and encouraged to contact the research team with any questions.  Patient was provided the option of taking informed consent documents home to review and was encouraged to review at their convenience with their support network, including other care providers. Patient is comfortable with making a decision regarding study participation today.  As outlined in the informed consent form, this Nurse and [name] discussed the purpose of the research study, the investigational nature of the study, study procedures and requirements for study participation, potential risks and benefits of study participation, as well as alternatives to participation. This study is not blinded. The patient understands participation is voluntary and they may withdraw from study participation at any time.  This study does not involve randomization.  This study does not involve an investigational drug or device. This study does not involve a placebo. Patient understands enrollment is pending full eligibility review.   Confidentiality and how the patient's information will be used as part of study participation were discussed.  Patient was informed there is not reimbursement provided for their time and effort spent on trial participation.  The patient is encouraged to discuss research study participation  with their insurance provider to determine what costs they may incur as part of study participation, including research related injury.    All questions were answered to patient's satisfaction.  The informed consent and separate HIPAA Authorization was reviewed page by page.  The patient's mental and emotional status is appropriate to provide informed consent, and the patient verbalizes an understanding of study participation.  Patient has agreed to participate in the above listed research study and has voluntarily signed the informed consent Protocol version/date 06/09/2019 ([facility] date 09/15/2021)  and separate HIPAA Authorization, version 12/02/2019 ([facility] date 10/11/2021)  on 11/08/21 at 1450 PM.  The patient was provided with a copy of the signed informed consent form and separate HIPAA Authorization for their reference.  No study specific procedures were obtained prior to the signing of the informed consent document.  Approximately 30 minutes were spent with the patient reviewing the informed consent documents.    Marjie Skiff Fahad Cisse, RN, BSN, [place] Direct Dial [phone]  Pager [phone] 11/08/2021 3:01 PM

## 2021-11-10 MED FILL — Dexamethasone Sodium Phosphate Inj 100 MG/10ML: INTRAMUSCULAR | Qty: 1 | Status: AC

## 2021-11-10 MED FILL — Fosaprepitant Dimeglumine For IV Infusion 150 MG (Base Eq): INTRAVENOUS | Qty: 5 | Status: AC

## 2021-11-12 NOTE — Progress Notes (Signed)
Patient Care Team: Elsie Stain, MD as PCP - General (Pulmonary Disease) Rockwell Germany, RN as Oncology Nurse Navigator Mauro Kaufmann, RN as Oncology Nurse Navigator  DIAGNOSIS:    ICD-10-CM   1. Malignant neoplasm of upper-outer quadrant of left breast in female, estrogen receptor positive ([place])  C50.412    Z17.0       SUMMARY OF ONCOLOGIC HISTORY: Oncology History  Malignant neoplasm of upper-outer quadrant of left breast in female, estrogen receptor positive ([place])  10/04/2021 Initial Diagnosis   Palpable left breast mass and left breast swelling for 2 months, mammogram revealed 9 cm abnormality at 2 o'clock position plus another mass at 8:00 measuring 1.3 cm: Biopsy of both revealed grade 3 IDC ER 95%, PR 0%, HER2 negative, Ki-67 40%, 5 abnormal lymph nodes: Biopsy of 1 was positive (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  Variants of uncertain significance detected in POT1 at c.476T>C (p.Met159Thr) and PRKAR1A at c.27T>G (p.Ser9Arg).  The report dates are October 19, 2021 and October 29, 2021, respectively.   The Multi-Cancer + RNA Panel offered by [facility] includes sequencing and/or deletion/duplication analysis of the following 84 genes:  AIP*, ALK, APC*, ATM*, AXIN2*, BAP1*, BARD1*, BLM*, BMPR1A*, BRCA1*, BRCA2*, BRIP1*, CASR, CDC73*, CDH1*, CDK4, CDKN1B*, CDKN1C*, CDKN2A, CEBPA, CHEK2*, CTNNA1*, DICER1*, DIS3L2*, EGFR, EPCAM, FH*, FLCN*, GATA2*, GPC3, GREM1, HOXB13, HRAS, KIT, MAX*, MEN1*, MET, MITF, MLH1*, MSH2*, MSH3*, MSH6*, MUTYH*, NBN*,  NF1*, NF2*, NTHL1*, PALB2*, PDGFRA, PHOX2B, PMS2*, POLD1*, POLE*, POT1*, PRKAR1A*, PTCH1*, PTEN*, RAD50*, RAD51C*, RAD51D*, RB1*, RECQL4, RET, RUNX1*, SDHA*, SDHAF2*, SDHB*, SDHC*, SDHD*, SMAD4*, SMARCA4*, SMARCB1*, SMARCE1*, STK11*, SUFU*, TERC, TERT, TMEM127*, Tp53*, TSC1*, TSC2*, VHL*, WRN*, and WT1.  RNA analysis is performed for * genes.   11/01/2021 -  Chemotherapy   Patient is on Treatment Plan : BREAST ADJUVANT DOSE DENSE AC q14d / PACLitaxel q7d       CHIEF COMPLIANT: Cycle 2 dose dense Adriamycin and Cytoxan  INTERVAL HISTORY: Alicia Morton is a 40 y.o. with above-mentioned history of left breast cancer, currently on chemotherapy with AC. She presents to the clinic today for treatment.  No further problems last week.  Denies any nausea or vomiting.  Did have cold-like symptoms and is taking cold medication.  ALLERGIES:  has No Known Allergies.  MEDICATIONS:  Current Outpatient Medications  Medication Sig Dispense Refill   albuterol (PROVENTIL) (2.5 MG/3ML) 0.083% nebulizer solution Take 3 mLs (2.5 mg total) by nebulization every 6 (six) hours as needed for wheezing or shortness of breath. 150 mL 1   albuterol (VENTOLIN HFA) 108 (90 Base) MCG/ACT inhaler Inhale 1-2 puffs into the lungs every 6 (six) hours as needed for wheezing or shortness of breath. 18 g 0   cetirizine (ZYRTEC) 10 MG tablet Take 10 mg by mouth daily.     dexamethasone (DECADRON) 4 MG tablet Take 1 tablet (4 mg total) by mouth daily. Take 1 tablet day after chemo and 1 tablet 2 days after chemo with food 8 tablet 0   lidocaine-prilocaine (EMLA) cream Apply to affected area once 30 g 3  ondansetron (ZOFRAN) 8 MG tablet Take 1 tablet (8 mg total) by mouth 2 (two) times daily as needed. Start on the third day after chemotherapy. 30 tablet 1   prochlorperazine (COMPAZINE) 10 MG tablet Take 1 tablet (10 mg total) by mouth every 6 (six) hours as needed (Nausea or vomiting). 30 tablet 1   No current  facility-administered medications for this visit.    PHYSICAL EXAMINATION: ECOG PERFORMANCE STATUS: 1 - Symptomatic but completely ambulatory  Vitals:   11/13/21 1343 11/13/21 1344  BP: (!) 129/39 (!) 134/53  Pulse: 83   Resp: 18   Temp: 97.7 F (36.5 C)   SpO2: 100%    Filed Weights   11/13/21 1343  Weight: 234 lb 11.2 oz (106.5 kg)     LABORATORY DATA:  I have reviewed the data as listed CMP Latest Ref Rng & Units 11/08/2021 10/31/2021 04/15/2021  Glucose 70 - 99 mg/dL 73 82 95  BUN 6 - 20 mg/dL _0 Creatinine 0.44 - 1.00 mg/dL 0.66 0.74 0.79  Sodium 135 - 145 mmol/L 138 139 136  Potassium 3.5 - 5.1 mmol/L 4.1 3.4(L) 3.7  Chloride 98 - 111 mmol/L 105 105 105  CO2 22 - 32 mmol/L _1 Calcium 8.9 - 10.3 mg/dL 9.1 9.0 8.7(L)  Total Protein 6.5 - 8.1 g/dL 8.0 8.5(H) 7.9  Total Bilirubin 0.3 - 1.2 mg/dL 0.3 0.3 0.5  Alkaline Phos 38 - 126 U/L 76 69 75  AST 15 - 41 U/L 12(L) 14(L) 22  ALT 0 - 44 U/L _2 Lab Results  Component Value Date   WBC 10.4 11/13/2021   HGB 9.4 (L) 11/13/2021   HCT 30.2 (L) 11/13/2021   MCV 87.8 11/13/2021   PLT 187 11/13/2021   NEUTROABS 7.0 11/13/2021    ASSESSMENT & PLAN:  Malignant neoplasm of upper-outer quadrant of left breast in female, estrogen receptor positive ([place]) 10/04/2021: Palpable left breast mass and left breast swelling for 2 months, mammogram revealed 9 cm abnormality at 2 o'clock position plus another mass at 8:00 measuring 1.3 cm: 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 Bone scan 10/23/2021: No evidence of bony metastases.  Left mandible activity benign   Treatment plan: 1.  Neoadjuvant chemotherapy with dose dense Adriamycin and Cytoxan followed by Taxol 2. modified radical  mastectomy 3.  Adjuvant radiation 4.  Adjuvant antiestrogen therapy with ovarian function suppression plus + AI + Verzinio ------------------------------------------------------------------------------------------------------------------------------------------- Current treatment: Cycle 2 dose dense Adriamycin and Cytoxan Echocardiogram 10/27/2021: EF 60 to 65%   Chemo toxicities: Very mild nausea Fatigue Chemotherapy-induced anemia: (On top of her pre-existing mild anemia): Monitoring closely   Return to clinic in 2 weeks for cycle 3    No orders of the defined types were placed in this encounter.  The patient has a good understanding of the overall plan. she agrees with it. she will call with any problems that may develop before the next visit here.  Total time spent: 30 mins including face to face time and time spent for planning, charting and coordination of care  Rulon Eisenmenger, MD, MPH 11/13/2021  I, Thana Ates, am acting as scribe for Dr. Nicholas Lose.  I have reviewed the above documentation  for accuracy and completeness, and I agree with the above.

## 2021-11-13 ENCOUNTER — Inpatient Hospital Stay (HOSPITAL_BASED_OUTPATIENT_CLINIC_OR_DEPARTMENT_OTHER): Payer: Medicaid Other | Admitting: Hematology and Oncology

## 2021-11-13 ENCOUNTER — Other Ambulatory Visit: Payer: Self-pay

## 2021-11-13 ENCOUNTER — Inpatient Hospital Stay: Payer: Medicaid Other

## 2021-11-13 DIAGNOSIS — Z170 Estrogen receptor positive status [ER+]: Secondary | ICD-10-CM

## 2021-11-13 DIAGNOSIS — Z5111 Encounter for antineoplastic chemotherapy: Secondary | ICD-10-CM | POA: Diagnosis not present

## 2021-11-13 DIAGNOSIS — Z95828 Presence of other vascular implants and grafts: Secondary | ICD-10-CM

## 2021-11-13 DIAGNOSIS — C50412 Malignant neoplasm of upper-outer quadrant of left female breast: Secondary | ICD-10-CM

## 2021-11-13 HISTORY — DX: Presence of other vascular implants and grafts: Z95.828

## 2021-11-13 LAB — CBC WITH DIFFERENTIAL (CANCER CENTER ONLY)
Abs Immature Granulocytes: 0.57 10*3/uL — ABNORMAL HIGH (ref 0.00–0.07)
Basophils Absolute: 0.1 10*3/uL (ref 0.0–0.1)
Basophils Relative: 1 %
Eosinophils Absolute: 0.1 10*3/uL (ref 0.0–0.5)
Eosinophils Relative: 1 %
HCT: 30.2 % — ABNORMAL LOW (ref 36.0–46.0)
Hemoglobin: 9.4 g/dL — ABNORMAL LOW (ref 12.0–15.0)
Immature Granulocytes: 6 %
Lymphocytes Relative: 18 %
Lymphs Abs: 1.8 10*3/uL (ref 0.7–4.0)
MCH: 27.3 pg (ref 26.0–34.0)
MCHC: 31.1 g/dL (ref 30.0–36.0)
MCV: 87.8 fL (ref 80.0–100.0)
Monocytes Absolute: 0.8 10*3/uL (ref 0.1–1.0)
Monocytes Relative: 8 %
Neutro Abs: 7 10*3/uL (ref 1.7–7.7)
Neutrophils Relative %: 66 %
Platelet Count: 187 10*3/uL (ref 150–400)
RBC: 3.44 MIL/uL — ABNORMAL LOW (ref 3.87–5.11)
RDW: 14.9 % (ref 11.5–15.5)
WBC Count: 10.4 10*3/uL (ref 4.0–10.5)
nRBC: 1.4 % — ABNORMAL HIGH (ref 0.0–0.2)

## 2021-11-13 LAB — CMP (CANCER CENTER ONLY)
ALT: 11 U/L (ref 0–44)
AST: 14 U/L — ABNORMAL LOW (ref 15–41)
Albumin: 3.4 g/dL — ABNORMAL LOW (ref 3.5–5.0)
Alkaline Phosphatase: 83 U/L (ref 38–126)
Anion gap: 9 (ref 5–15)
BUN: 8 mg/dL (ref 6–20)
CO2: 25 mmol/L (ref 22–32)
Calcium: 8.2 mg/dL — ABNORMAL LOW (ref 8.9–10.3)
Chloride: 107 mmol/L (ref 98–111)
Creatinine: 0.77 mg/dL (ref 0.44–1.00)
GFR, Estimated: >60 mL/min (ref >60)
Glucose, Bld: 114 mg/dL — ABNORMAL HIGH (ref 70–99)
Potassium: 3.5 mmol/L (ref 3.5–5.1)
Sodium: 141 mmol/L (ref 135–145)
Total Bilirubin: 0.3 mg/dL (ref 0.3–1.2)
Total Protein: 7.5 g/dL (ref 6.5–8.1)

## 2021-11-13 MED ORDER — SODIUM CHLORIDE 0.9% FLUSH
10.0000 mL | Freq: Once | INTRAVENOUS | Status: AC
  Administered 2021-11-13: 13:00:00 10 mL

## 2021-11-13 MED ORDER — SODIUM CHLORIDE 0.9 % IV SOLN: Freq: Once | INTRAVENOUS | Status: AC

## 2021-11-13 MED ORDER — DEXAMETHASONE SODIUM PHOSPHATE 100 MG/10ML IJ SOLN
10.0000 mg | Freq: Once | INTRAMUSCULAR | Status: AC
  Administered 2021-11-13: 14:00:00 10 mg via INTRAVENOUS
  Filled 2021-11-13: qty 10

## 2021-11-13 MED ORDER — SODIUM CHLORIDE 0.9 % IV SOLN
150.0000 mg | Freq: Once | INTRAVENOUS | Status: AC
  Administered 2021-11-13: 14:00:00 150 mg via INTRAVENOUS
  Filled 2021-11-13: qty 150

## 2021-11-13 MED ORDER — SODIUM CHLORIDE 0.9% FLUSH
10.0000 mL | INTRAVENOUS | Status: DC | PRN
  Administered 2021-11-13: 16:00:00 10 mL

## 2021-11-13 MED ORDER — HEPARIN SOD (PORK) LOCK FLUSH 100 UNIT/ML IV SOLN
500.0000 [IU] | Freq: Once | INTRAVENOUS | Status: AC | PRN
  Administered 2021-11-13: 16:00:00 500 [IU]

## 2021-11-13 MED ORDER — DOXORUBICIN HCL CHEMO IV INJECTION 2 MG/ML
60.0000 mg/m2 | Freq: Once | INTRAVENOUS | Status: AC
  Administered 2021-11-13: 15:00:00 136 mg via INTRAVENOUS
  Filled 2021-11-13: qty 68

## 2021-11-13 MED ORDER — SODIUM CHLORIDE 0.9 % IV SOLN
600.0000 mg/m2 | Freq: Once | INTRAVENOUS | Status: AC
  Administered 2021-11-13: 16:00:00 1360 mg via INTRAVENOUS
  Filled 2021-11-13: qty 68

## 2021-11-13 MED ORDER — PALONOSETRON HCL INJECTION 0.25 MG/5ML
0.2500 mg | Freq: Once | INTRAVENOUS | Status: AC
  Administered 2021-11-13: 14:00:00 0.25 mg via INTRAVENOUS
  Filled 2021-11-13: qty 5

## 2021-11-13 NOTE — Patient Instructions (Signed)
[facility]  Discharge Instructions: Thank you for choosing [facility] to provide your oncology and hematology care.   If you have a lab appointment with the [facility], please go directly to the [facility] and check in at the registration area.   Wear comfortable clothing and clothing appropriate for easy access to any Portacath or PICC line.   We strive to give you quality time with your provider. You may need to reschedule your appointment if you arrive late (15 or more minutes).  Arriving late affects you and other patients whose appointments are after yours.  Also, if you miss three or more appointments without notifying the office, you may be dismissed from the clinic at the provider's discretion.      For prescription refill requests, have your pharmacy contact our office and allow 72 hours for refills to be completed.    Today you received the following chemotherapy and/or immunotherapy agents :  Doxorubicin, Cyclophosphamide.       To help prevent nausea and vomiting after your treatment, we encourage you to take your nausea medication as directed.  BELOW ARE SYMPTOMS THAT SHOULD BE REPORTED IMMEDIATELY: *FEVER GREATER THAN 100.4 F (38 C) OR HIGHER *CHILLS OR SWEATING *NAUSEA AND VOMITING THAT IS NOT CONTROLLED WITH YOUR NAUSEA MEDICATION *UNUSUAL SHORTNESS OF BREATH *UNUSUAL BRUISING OR BLEEDING *URINARY PROBLEMS (pain or burning when urinating, or frequent urination) *BOWEL PROBLEMS (unusual diarrhea, constipation, pain near the anus) TENDERNESS IN MOUTH AND THROAT WITH OR WITHOUT PRESENCE OF ULCERS (sore throat, sores in mouth, or a toothache) UNUSUAL RASH, SWELLING OR PAIN  UNUSUAL VAGINAL DISCHARGE OR ITCHING   Items with * indicate a potential emergency and should be followed up as soon as possible or go to the Emergency Department if any problems should occur.  Please show the CHEMOTHERAPY ALERT CARD or IMMUNOTHERAPY  ALERT CARD at check-in to the Emergency Department and triage nurse.  Should you have questions after your visit or need to cancel or reschedule your appointment, please contact [facility]  Dept: [phone]  and follow the prompts.  Office hours are 8:00 a.m. to 4:30 p.m. Monday - Friday. Please note that voicemails left after 4:00 p.m. may not be returned until the following business day.  We are closed weekends and major holidays. You have access to a nurse at all times for urgent questions. Please call the main number to the clinic Dept: [phone] and follow the prompts.   For any non-urgent questions, you may also contact your provider using MyChart. We now offer e-Visits for anyone 42 and older to request care online for non-urgent symptoms. For details visit [email or web address].   Also download the MyChart app! Go to the app store, search "MyChart", open the app, select [facility], and log in with your MyChart username and password.  Due to Covid, a mask is required upon entering the hospital/clinic. If you do not have a mask, one will be given to you upon arrival. For doctor visits, patients may have 1 support person aged 42 or older with them. For treatment visits, patients cannot have anyone with them due to current Covid guidelines and our immunocompromised population.

## 2021-11-13 NOTE — Assessment & Plan Note (Signed)
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 Bone scan 10/23/2021: No evidence of bony metastases. Left mandible activity benign  Treatment plan: 1.Neoadjuvant chemotherapy with dose dense Adriamycin and Cytoxan followed by Taxol 2.modified radical mastectomy 3.Adjuvant radiation 4.Adjuvant antiestrogen therapy with ovarian function suppression plus + AI + Verzinio ------------------------------------------------------------------------------------------------------------------------------------------- Current treatment: Cycle 2 dose dense Adriamycin and Cytoxan Echocardiogram 10/27/2021: EF 60 to 65%   Chemo toxicities: 1. Very mild nausea 2. Fatigue  Return to clinic in 2 weeks for cycle 3

## 2021-11-14 ENCOUNTER — Telehealth: Payer: Self-pay | Admitting: Hematology and Oncology

## 2021-11-14 ENCOUNTER — Encounter: Payer: Self-pay | Admitting: *Deleted

## 2021-11-14 NOTE — Telephone Encounter (Signed)
Scheduled per sch msg. Called and left msg  

## 2021-11-15 ENCOUNTER — Other Ambulatory Visit: Payer: Self-pay | Admitting: *Deleted

## 2021-11-15 ENCOUNTER — Inpatient Hospital Stay: Payer: Medicaid Other

## 2021-11-15 ENCOUNTER — Other Ambulatory Visit: Payer: Self-pay

## 2021-11-15 VITALS — BP 141/50 | HR 70 | Temp 98.7°F | Resp 16

## 2021-11-15 DIAGNOSIS — C50412 Malignant neoplasm of upper-outer quadrant of left female breast: Secondary | ICD-10-CM

## 2021-11-15 DIAGNOSIS — Z170 Estrogen receptor positive status [ER+]: Secondary | ICD-10-CM

## 2021-11-15 DIAGNOSIS — Z5111 Encounter for antineoplastic chemotherapy: Secondary | ICD-10-CM | POA: Diagnosis not present

## 2021-11-15 DIAGNOSIS — Z124 Encounter for screening for malignant neoplasm of cervix: Secondary | ICD-10-CM

## 2021-11-15 MED ORDER — PEGFILGRASTIM-CBQV 6 MG/0.6ML ~~LOC~~ SOSY
6.0000 mg | PREFILLED_SYRINGE | Freq: Once | SUBCUTANEOUS | Status: AC
  Administered 2021-11-15: 6 mg via SUBCUTANEOUS
  Filled 2021-11-15: qty 0.6
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
## 2021-11-17 ENCOUNTER — Encounter: Payer: Self-pay | Admitting: Hematology and Oncology

## 2021-11-17 NOTE — Progress Notes (Signed)
..  Patient Assist/Replace for the following has been terminated. Medication: Udenyca ([id number]) Reason for Termination: Medicaid BCCCP starting 10/17/2021.  Last DOS: 11/15/2021.  Marland KitchenJuan Quam, CPhT IV Drug Replacement Specialist [employer] Phone: [phone]

## 2021-11-21 ENCOUNTER — Telehealth: Payer: Self-pay

## 2021-11-21 LAB — CYTOLOGY - PAP
Comment: NEGATIVE
Diagnosis: UNDETERMINED — AB
High risk HPV: NEGATIVE

## 2021-11-21 NOTE — Telephone Encounter (Signed)
Patient informed Pap results, ASC-US, and HPV is negative, needs to repeat pap in 1 year. Patient verbalized understanding.

## 2021-11-24 MED FILL — Fosaprepitant Dimeglumine For IV Infusion 150 MG (Base Eq): INTRAVENOUS | Qty: 5 | Status: AC

## 2021-11-24 MED FILL — Dexamethasone Sodium Phosphate Inj 100 MG/10ML: INTRAMUSCULAR | Qty: 1 | Status: AC

## 2021-11-27 ENCOUNTER — Other Ambulatory Visit: Payer: Self-pay

## 2021-11-27 ENCOUNTER — Encounter: Payer: Self-pay | Admitting: *Deleted

## 2021-11-27 ENCOUNTER — Inpatient Hospital Stay: Payer: Medicaid Other

## 2021-11-27 ENCOUNTER — Encounter: Payer: Self-pay | Admitting: Adult Health

## 2021-11-27 ENCOUNTER — Inpatient Hospital Stay: Payer: Medicaid Other | Attending: Hematology and Oncology | Admitting: Adult Health

## 2021-11-27 VITALS — BP 121/42 | HR 76 | Temp 97.7°F | Resp 18 | Ht 70.0 in | Wt 235.4 lb

## 2021-11-27 DIAGNOSIS — C50412 Malignant neoplasm of upper-outer quadrant of left female breast: Secondary | ICD-10-CM | POA: Diagnosis not present

## 2021-11-27 DIAGNOSIS — Z5111 Encounter for antineoplastic chemotherapy: Secondary | ICD-10-CM | POA: Diagnosis not present

## 2021-11-27 DIAGNOSIS — K5900 Constipation, unspecified: Secondary | ICD-10-CM | POA: Diagnosis not present

## 2021-11-27 DIAGNOSIS — Z95828 Presence of other vascular implants and grafts: Secondary | ICD-10-CM

## 2021-11-27 DIAGNOSIS — Z170 Estrogen receptor positive status [ER+]: Secondary | ICD-10-CM | POA: Insufficient documentation

## 2021-11-27 DIAGNOSIS — R5383 Other fatigue: Secondary | ICD-10-CM | POA: Diagnosis not present

## 2021-11-27 DIAGNOSIS — Z5189 Encounter for other specified aftercare: Secondary | ICD-10-CM | POA: Insufficient documentation

## 2021-11-27 DIAGNOSIS — Z87891 Personal history of nicotine dependence: Secondary | ICD-10-CM | POA: Insufficient documentation

## 2021-11-27 LAB — CMP (CANCER CENTER ONLY)
ALT: 12 U/L (ref 0–44)
AST: 13 U/L — ABNORMAL LOW (ref 15–41)
Albumin: 3.4 g/dL — ABNORMAL LOW (ref 3.5–5.0)
Alkaline Phosphatase: 70 U/L (ref 38–126)
Anion gap: 8 (ref 5–15)
BUN: 9 mg/dL (ref 6–20)
CO2: 25 mmol/L (ref 22–32)
Calcium: 8.2 mg/dL — ABNORMAL LOW (ref 8.9–10.3)
Chloride: 109 mmol/L (ref 98–111)
Creatinine: 0.66 mg/dL (ref 0.44–1.00)
GFR, Estimated: >60 mL/min (ref >60)
Glucose, Bld: 102 mg/dL — ABNORMAL HIGH (ref 70–99)
Potassium: 3.4 mmol/L — ABNORMAL LOW (ref 3.5–5.1)
Sodium: 142 mmol/L (ref 135–145)
Total Bilirubin: 0.3 mg/dL (ref 0.3–1.2)
Total Protein: 7.2 g/dL (ref 6.5–8.1)

## 2021-11-27 LAB — CBC WITH DIFFERENTIAL (CANCER CENTER ONLY)
Abs Immature Granulocytes: 0.11 10*3/uL — ABNORMAL HIGH (ref 0.00–0.07)
Basophils Absolute: 0.1 10*3/uL (ref 0.0–0.1)
Basophils Relative: 1 %
Eosinophils Absolute: 0.1 10*3/uL (ref 0.0–0.5)
Eosinophils Relative: 1 %
HCT: 30 % — ABNORMAL LOW (ref 36.0–46.0)
Hemoglobin: 9.3 g/dL — ABNORMAL LOW (ref 12.0–15.0)
Immature Granulocytes: 1 %
Lymphocytes Relative: 19 %
Lymphs Abs: 1.5 10*3/uL (ref 0.7–4.0)
MCH: 27.4 pg (ref 26.0–34.0)
MCHC: 31 g/dL (ref 30.0–36.0)
MCV: 88.2 fL (ref 80.0–100.0)
Monocytes Absolute: 0.7 10*3/uL (ref 0.1–1.0)
Monocytes Relative: 9 %
Neutro Abs: 5.4 10*3/uL (ref 1.7–7.7)
Neutrophils Relative %: 69 %
Platelet Count: 328 10*3/uL (ref 150–400)
RBC: 3.4 MIL/uL — ABNORMAL LOW (ref 3.87–5.11)
RDW: 16.5 % — ABNORMAL HIGH (ref 11.5–15.5)
WBC Count: 7.9 10*3/uL (ref 4.0–10.5)
nRBC: 0.8 % — ABNORMAL HIGH (ref 0.0–0.2)

## 2021-11-27 MED ORDER — SODIUM CHLORIDE 0.9 % IV SOLN: Freq: Once | INTRAVENOUS | Status: AC

## 2021-11-27 MED ORDER — SODIUM CHLORIDE 0.9 % IV SOLN
10.0000 mg | Freq: Once | INTRAVENOUS | Status: AC
  Administered 2021-11-27: 10 mg via INTRAVENOUS
  Filled 2021-11-27: qty 10

## 2021-11-27 MED ORDER — HEPARIN SOD (PORK) LOCK FLUSH 100 UNIT/ML IV SOLN
500.0000 [IU] | Freq: Once | INTRAVENOUS | Status: AC | PRN
  Administered 2021-11-27: 500 [IU]

## 2021-11-27 MED ORDER — SODIUM CHLORIDE 0.9% FLUSH
10.0000 mL | INTRAVENOUS | Status: DC | PRN
  Administered 2021-11-27: 10 mL

## 2021-11-27 MED ORDER — DOXORUBICIN HCL CHEMO IV INJECTION 2 MG/ML
60.0000 mg/m2 | Freq: Once | INTRAVENOUS | Status: AC
  Administered 2021-11-27: 136 mg via INTRAVENOUS
  Filled 2021-11-27: qty 68

## 2021-11-27 MED ORDER — SODIUM CHLORIDE 0.9 % IV SOLN
150.0000 mg | Freq: Once | INTRAVENOUS | Status: AC
  Administered 2021-11-27: 150 mg via INTRAVENOUS
  Filled 2021-11-27: qty 150

## 2021-11-27 MED ORDER — PALONOSETRON HCL INJECTION 0.25 MG/5ML
0.2500 mg | Freq: Once | INTRAVENOUS | Status: AC
  Administered 2021-11-27: 0.25 mg via INTRAVENOUS
  Filled 2021-11-27: qty 5

## 2021-11-27 MED ORDER — SODIUM CHLORIDE 0.9 % IV SOLN
600.0000 mg/m2 | Freq: Once | INTRAVENOUS | Status: AC
  Administered 2021-11-27: 1360 mg via INTRAVENOUS
  Filled 2021-11-27: qty 68

## 2021-11-27 MED ORDER — SODIUM CHLORIDE 0.9% FLUSH
10.0000 mL | Freq: Once | INTRAVENOUS | Status: AC
  Administered 2021-11-27: 10 mL
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
## 2021-11-27 NOTE — Assessment & Plan Note (Addendum)
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 Bone scan 10/23/2021: No evidence of bony metastases. Left mandible activity benign  Treatment plan: 1.Neoadjuvant chemotherapy with dose dense Adriamycin and Cytoxan followed by Taxol 2.modified radical mastectomy 3.Adjuvant radiation 4.Adjuvant antiestrogen therapy with ovarian function suppression plus + AI + Verzinio ------------------------------------------------------------------------------------------------------------------------------------------- Current treatment: Cycle 3 dose dense Adriamycin and Cytoxan Echocardiogram 10/27/2021: EF 60 to 65%   Chemo toxicities: 1. Very mild nausea 2. Fatigue   Elmo Putt continues to tolerate her chemotherapy quite well.  We reviewed the fact that she has some vaginal dryness.  I recommended that she get vitamin E suppositories from [place] to use.  Overall she is doing well on notes that her breast tumor is smaller softer and hardly palpable.  This is compatible with early clinical response to her chemotherapy which is favorable.  Return to clinic in 2 weeks for cycle 4

## 2021-11-27 NOTE — Patient Instructions (Signed)
[facility]  Discharge Instructions: Thank you for choosing [facility] to provide your oncology and hematology care.   If you have a lab appointment with the [facility], please go directly to the [facility] and check in at the registration area.   Wear comfortable clothing and clothing appropriate for easy access to any Portacath or PICC line.   We strive to give you quality time with your provider. You may need to reschedule your appointment if you arrive late (15 or more minutes).  Arriving late affects you and other patients whose appointments are after yours.  Also, if you miss three or more appointments without notifying the office, you may be dismissed from the clinic at the provider's discretion.      For prescription refill requests, have your pharmacy contact our office and allow 72 hours for refills to be completed.    Today you received the following chemotherapy and/or immunotherapy agents: Adriamycin/Cytoxan.      To help prevent nausea and vomiting after your treatment, we encourage you to take your nausea medication as directed.  BELOW ARE SYMPTOMS THAT SHOULD BE REPORTED IMMEDIATELY: *FEVER GREATER THAN 100.4 F (38 C) OR HIGHER *CHILLS OR SWEATING *NAUSEA AND VOMITING THAT IS NOT CONTROLLED WITH YOUR NAUSEA MEDICATION *UNUSUAL SHORTNESS OF BREATH *UNUSUAL BRUISING OR BLEEDING *URINARY PROBLEMS (pain or burning when urinating, or frequent urination) *BOWEL PROBLEMS (unusual diarrhea, constipation, pain near the anus) TENDERNESS IN MOUTH AND THROAT WITH OR WITHOUT PRESENCE OF ULCERS (sore throat, sores in mouth, or a toothache) UNUSUAL RASH, SWELLING OR PAIN  UNUSUAL VAGINAL DISCHARGE OR ITCHING   Items with * indicate a potential emergency and should be followed up as soon as possible or go to the Emergency Department if any problems should occur.  Please show the CHEMOTHERAPY ALERT CARD or IMMUNOTHERAPY ALERT CARD at  check-in to the Emergency Department and triage nurse.  Should you have questions after your visit or need to cancel or reschedule your appointment, please contact [facility]  Dept: [phone]  and follow the prompts.  Office hours are 8:00 a.m. to 4:30 p.m. Monday - Friday. Please note that voicemails left after 4:00 p.m. may not be returned until the following business day.  We are closed weekends and major holidays. You have access to a nurse at all times for urgent questions. Please call the main number to the clinic Dept: [phone] and follow the prompts.   For any non-urgent questions, you may also contact your provider using MyChart. We now offer e-Visits for anyone 9 and older to request care online for non-urgent symptoms. For details visit [email or web address].   Also download the MyChart app! Go to the app store, search "MyChart", open the app, select [facility], and log in with your MyChart username and password.  Due to Covid, a mask is required upon entering the hospital/clinic. If you do not have a mask, one will be given to you upon arrival. For doctor visits, patients may have 1 support person aged 42 or older with them. For treatment visits, patients cannot have anyone with them due to current Covid guidelines and our immunocompromised population.

## 2021-11-28 ENCOUNTER — Other Ambulatory Visit: Payer: Self-pay

## 2021-11-28 ENCOUNTER — Encounter: Payer: Self-pay | Admitting: Hematology and Oncology

## 2021-11-28 ENCOUNTER — Other Ambulatory Visit: Payer: Self-pay | Admitting: Critical Care Medicine

## 2021-11-28 ENCOUNTER — Other Ambulatory Visit: Payer: Self-pay | Admitting: Hematology and Oncology

## 2021-11-28 DIAGNOSIS — C50412 Malignant neoplasm of upper-outer quadrant of left female breast: Secondary | ICD-10-CM

## 2021-11-28 DIAGNOSIS — Z170 Estrogen receptor positive status [ER+]: Secondary | ICD-10-CM

## 2021-11-28 MED ORDER — DEXAMETHASONE 4 MG PO TABS
4.0000 mg | ORAL_TABLET | Freq: Every day | ORAL | 0 refills | Status: DC
  Filled 2021-11-28: qty 8, 8d supply, fill #0

## 2021-11-28 NOTE — Telephone Encounter (Signed)
Medication Refill - Medication: dexamethasone (DECADRON) 4 MG tablet   Has the patient contacted their pharmacy? Yes.   (Agent: If no, request that the patient contact the pharmacy for the refill. If patient does not wish to contact the pharmacy document the reason why and proceed with request.) (Agent: If yes, when and what did the pharmacy advise?)  Preferred Pharmacy (with phone number or street name):  [address] [place] [address]  Phone: [phone] Fax: [phone]   Has the patient been seen for an appointment in the last year OR does the patient have an upcoming appointment? Yes.    Agent: Please be advised that RX refills may take up to 3 business days. We ask that you follow-up with your pharmacy.
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
## 2021-11-29 ENCOUNTER — Other Ambulatory Visit: Payer: Self-pay

## 2021-11-29 ENCOUNTER — Inpatient Hospital Stay: Payer: Medicaid Other

## 2021-11-29 VITALS — BP 113/40 | HR 58 | Temp 98.4°F | Resp 20

## 2021-11-29 DIAGNOSIS — C50412 Malignant neoplasm of upper-outer quadrant of left female breast: Secondary | ICD-10-CM

## 2021-11-29 DIAGNOSIS — Z5111 Encounter for antineoplastic chemotherapy: Secondary | ICD-10-CM | POA: Diagnosis not present

## 2021-11-29 DIAGNOSIS — Z170 Estrogen receptor positive status [ER+]: Secondary | ICD-10-CM

## 2021-11-29 MED ORDER — PEGFILGRASTIM-CBQV 6 MG/0.6ML ~~LOC~~ SOSY
6.0000 mg | PREFILLED_SYRINGE | Freq: Once | SUBCUTANEOUS | Status: AC
  Administered 2021-11-29: 10:00:00 6 mg via SUBCUTANEOUS
  Filled 2021-11-29: qty 0.6

## 2021-12-04 ENCOUNTER — Ambulatory Visit: Payer: No Typology Code available for payment source | Admitting: Hematology and Oncology

## 2021-12-04 ENCOUNTER — Ambulatory Visit: Payer: No Typology Code available for payment source

## 2021-12-04 ENCOUNTER — Encounter: Payer: Self-pay | Admitting: *Deleted

## 2021-12-04 ENCOUNTER — Other Ambulatory Visit: Payer: No Typology Code available for payment source

## 2021-12-06 ENCOUNTER — Ambulatory Visit: Payer: No Typology Code available for payment source

## 2021-12-07 ENCOUNTER — Encounter: Payer: Self-pay | Admitting: Hematology and Oncology

## 2021-12-11 NOTE — Assessment & Plan Note (Addendum)
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 Bone scan 10/23/2021: No evidence of bony metastases. Left mandible activity benign  Treatment plan: 1.Neoadjuvant chemotherapy with dose dense Adriamycin and Cytoxan followed by Taxol 2.modified radical mastectomy 3.Adjuvant radiation 4.Adjuvant antiestrogen therapy with ovarian function suppression plus + AI + Verzinio ------------------------------------------------------------------------------------------------------------------------------------------- Current treatment: Cycle 4 dose dense Adriamycin and Cytoxan Echocardiogram 10/27/2021: EF 60 to 65%   Chemo toxicities: 1. Very mild nausea 2. Fatigue   Alicia Morton continues to do well.  She has some dry hands and I recommended she put cream on her hands.  Otherwise she will proceed with chemo today.  Her labs are stable and I reviewed them with her in detail.  Return to clinic in 2 weeks to begin weekly Taxol.
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
## 2021-12-12 ENCOUNTER — Other Ambulatory Visit: Payer: Self-pay

## 2021-12-12 ENCOUNTER — Encounter: Payer: Self-pay | Admitting: Adult Health

## 2021-12-12 ENCOUNTER — Inpatient Hospital Stay (HOSPITAL_BASED_OUTPATIENT_CLINIC_OR_DEPARTMENT_OTHER): Payer: Medicaid Other | Admitting: Adult Health

## 2021-12-12 ENCOUNTER — Inpatient Hospital Stay: Payer: Medicaid Other

## 2021-12-12 VITALS — BP 133/52 | HR 73 | Temp 98.9°F | Resp 16 | Ht 70.0 in | Wt 233.1 lb

## 2021-12-12 DIAGNOSIS — C50412 Malignant neoplasm of upper-outer quadrant of left female breast: Secondary | ICD-10-CM

## 2021-12-12 DIAGNOSIS — Z170 Estrogen receptor positive status [ER+]: Secondary | ICD-10-CM

## 2021-12-12 DIAGNOSIS — Z5111 Encounter for antineoplastic chemotherapy: Secondary | ICD-10-CM | POA: Diagnosis not present

## 2021-12-12 DIAGNOSIS — Z95828 Presence of other vascular implants and grafts: Secondary | ICD-10-CM

## 2021-12-12 LAB — CBC WITH DIFFERENTIAL (CANCER CENTER ONLY)
Abs Immature Granulocytes: 0.06 10*3/uL (ref 0.00–0.07)
Basophils Absolute: 0.1 10*3/uL (ref 0.0–0.1)
Basophils Relative: 1 %
Eosinophils Absolute: 0.2 10*3/uL (ref 0.0–0.5)
Eosinophils Relative: 2 %
HCT: 32 % — ABNORMAL LOW (ref 36.0–46.0)
Hemoglobin: 9.9 g/dL — ABNORMAL LOW (ref 12.0–15.0)
Immature Granulocytes: 1 %
Lymphocytes Relative: 19 %
Lymphs Abs: 1.3 10*3/uL (ref 0.7–4.0)
MCH: 27.2 pg (ref 26.0–34.0)
MCHC: 30.9 g/dL (ref 30.0–36.0)
MCV: 87.9 fL (ref 80.0–100.0)
Monocytes Absolute: 0.7 10*3/uL (ref 0.1–1.0)
Monocytes Relative: 10 %
Neutro Abs: 4.5 10*3/uL (ref 1.7–7.7)
Neutrophils Relative %: 67 %
Platelet Count: 286 10*3/uL (ref 150–400)
RBC: 3.64 MIL/uL — ABNORMAL LOW (ref 3.87–5.11)
RDW: 17.1 % — ABNORMAL HIGH (ref 11.5–15.5)
WBC Count: 6.8 10*3/uL (ref 4.0–10.5)
nRBC: 0.9 % — ABNORMAL HIGH (ref 0.0–0.2)

## 2021-12-12 LAB — CMP (CANCER CENTER ONLY)
ALT: 13 U/L (ref 0–44)
AST: 16 U/L (ref 15–41)
Albumin: 3.8 g/dL (ref 3.5–5.0)
Alkaline Phosphatase: 75 U/L (ref 38–126)
Anion gap: 5 (ref 5–15)
BUN: 9 mg/dL (ref 6–20)
CO2: 28 mmol/L (ref 22–32)
Calcium: 9.1 mg/dL (ref 8.9–10.3)
Chloride: 106 mmol/L (ref 98–111)
Creatinine: 0.58 mg/dL (ref 0.44–1.00)
GFR, Estimated: >60 mL/min (ref >60)
Glucose, Bld: 89 mg/dL (ref 70–99)
Potassium: 3.7 mmol/L (ref 3.5–5.1)
Sodium: 139 mmol/L (ref 135–145)
Total Bilirubin: 0.3 mg/dL (ref 0.3–1.2)
Total Protein: 7.3 g/dL (ref 6.5–8.1)

## 2021-12-12 MED ORDER — SODIUM CHLORIDE 0.9 % IV SOLN
10.0000 mg | Freq: Once | INTRAVENOUS | Status: AC
  Administered 2021-12-12: 14:00:00 10 mg via INTRAVENOUS
  Filled 2021-12-12: qty 10

## 2021-12-12 MED ORDER — DOXORUBICIN HCL CHEMO IV INJECTION 2 MG/ML
60.0000 mg/m2 | Freq: Once | INTRAVENOUS | Status: AC
  Administered 2021-12-12: 15:00:00 136 mg via INTRAVENOUS
  Filled 2021-12-12: qty 68

## 2021-12-12 MED ORDER — SODIUM CHLORIDE 0.9 % IV SOLN
150.0000 mg | Freq: Once | INTRAVENOUS | Status: AC
  Administered 2021-12-12: 14:00:00 150 mg via INTRAVENOUS
  Filled 2021-12-12: qty 150

## 2021-12-12 MED ORDER — PALONOSETRON HCL INJECTION 0.25 MG/5ML
0.2500 mg | Freq: Once | INTRAVENOUS | Status: AC
  Administered 2021-12-12: 14:00:00 0.25 mg via INTRAVENOUS
  Filled 2021-12-12: qty 5

## 2021-12-12 MED ORDER — HEPARIN SOD (PORK) LOCK FLUSH 100 UNIT/ML IV SOLN
500.0000 [IU] | Freq: Once | INTRAVENOUS | Status: AC | PRN
  Administered 2021-12-12: 16:00:00 500 [IU]

## 2021-12-12 MED ORDER — SODIUM CHLORIDE 0.9% FLUSH
10.0000 mL | Freq: Once | INTRAVENOUS | Status: AC
  Administered 2021-12-12: 11:00:00 10 mL

## 2021-12-12 MED ORDER — SODIUM CHLORIDE 0.9 % IV SOLN
600.0000 mg/m2 | Freq: Once | INTRAVENOUS | Status: AC
  Administered 2021-12-12: 15:00:00 1360 mg via INTRAVENOUS
  Filled 2021-12-12: qty 68

## 2021-12-12 MED ORDER — SODIUM CHLORIDE 0.9 % IV SOLN
Freq: Once | INTRAVENOUS | Status: AC
Start: 2021-12-12 — End: 2021-12-12

## 2021-12-12 MED ORDER — SODIUM CHLORIDE 0.9% FLUSH
10.0000 mL | INTRAVENOUS | Status: DC | PRN
  Administered 2021-12-12: 16:00:00 10 mL

## 2021-12-12 NOTE — Patient Instructions (Signed)
[facility]  Discharge Instructions: Thank you for choosing [facility] to provide your oncology and hematology care.   If you have a lab appointment with the [facility], please go directly to the [facility] and check in at the registration area.   Wear comfortable clothing and clothing appropriate for easy access to any Portacath or PICC line.   We strive to give you quality time with your provider. You may need to reschedule your appointment if you arrive late (15 or more minutes).  Arriving late affects you and other patients whose appointments are after yours.  Also, if you miss three or more appointments without notifying the office, you may be dismissed from the clinic at the providers discretion.      For prescription refill requests, have your pharmacy contact our office and allow 72 hours for refills to be completed.    Today you received the following chemotherapy and/or immunotherapy agents Adriamycin, Cytoxin      To help prevent nausea and vomiting after your treatment, we encourage you to take your nausea medication as directed.  BELOW ARE SYMPTOMS THAT SHOULD BE REPORTED IMMEDIATELY: *FEVER GREATER THAN 100.4 F (38 C) OR HIGHER *CHILLS OR SWEATING *NAUSEA AND VOMITING THAT IS NOT CONTROLLED WITH YOUR NAUSEA MEDICATION *UNUSUAL SHORTNESS OF BREATH *UNUSUAL BRUISING OR BLEEDING *URINARY PROBLEMS (pain or burning when urinating, or frequent urination) *BOWEL PROBLEMS (unusual diarrhea, constipation, pain near the anus) TENDERNESS IN MOUTH AND THROAT WITH OR WITHOUT PRESENCE OF ULCERS (sore throat, sores in mouth, or a toothache) UNUSUAL RASH, SWELLING OR PAIN  UNUSUAL VAGINAL DISCHARGE OR ITCHING   Items with * indicate a potential emergency and should be followed up as soon as possible or go to the Emergency Department if any problems should occur.  Please show the CHEMOTHERAPY ALERT CARD or IMMUNOTHERAPY ALERT CARD at  check-in to the Emergency Department and triage nurse.  Should you have questions after your visit or need to cancel or reschedule your appointment, please contact [facility]  Dept: [phone]  and follow the prompts.  Office hours are 8:00 a.m. to 4:30 p.m. Monday - Friday. Please note that voicemails left after 4:00 p.m. may not be returned until the following business day.  We are closed weekends and major holidays. You have access to a nurse at all times for urgent questions. Please call the main number to the clinic Dept: [phone] and follow the prompts.   For any non-urgent questions, you may also contact your provider using MyChart. We now offer e-Visits for anyone 70 and older to request care online for non-urgent symptoms. For details visit [email or web address].   Also download the MyChart app! Go to the app store, search "MyChart", open the app, select [facility], and log in with your MyChart username and password.  Due to Covid, a mask is required upon entering the hospital/clinic. If you do not have a mask, one will be given to you upon arrival. For doctor visits, patients may have 1 support person aged 28 or older with them. For treatment visits, patients cannot have anyone with them due to current Covid guidelines and our immunocompromised population.

## 2021-12-14 ENCOUNTER — Inpatient Hospital Stay: Payer: Medicaid Other

## 2021-12-14 ENCOUNTER — Other Ambulatory Visit: Payer: Self-pay

## 2021-12-14 VITALS — BP 105/44 | HR 71 | Temp 98.1°F | Resp 18

## 2021-12-14 DIAGNOSIS — Z170 Estrogen receptor positive status [ER+]: Secondary | ICD-10-CM

## 2021-12-14 DIAGNOSIS — Z5111 Encounter for antineoplastic chemotherapy: Secondary | ICD-10-CM | POA: Diagnosis not present

## 2021-12-14 MED ORDER — PEGFILGRASTIM-CBQV 6 MG/0.6ML ~~LOC~~ SOSY
6.0000 mg | PREFILLED_SYRINGE | Freq: Once | SUBCUTANEOUS | Status: AC
  Administered 2021-12-14: 11:00:00 6 mg via SUBCUTANEOUS
  Filled 2021-12-14: qty 0.6

## 2021-12-22 MED FILL — Dexamethasone Sodium Phosphate Inj 100 MG/10ML: INTRAMUSCULAR | Qty: 1 | Status: AC

## 2021-12-24 NOTE — Assessment & Plan Note (Signed)
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 Bone scan 10/23/2021: No evidence of bony metastases. Left mandible activity benign  Treatment plan: 1.Neoadjuvant chemotherapy with dose dense Adriamycin and Cytoxan followed by Taxol 2.modified radical mastectomy 3.Adjuvant radiation 4.Adjuvant antiestrogen therapy with ovarian function suppression plus + AI + Verzinio ------------------------------------------------------------------------------------------------------------------------------------------- Current treatment: Completed 4 cycles of dose dense Adriamycin and Cytoxan, Today is cycle 1 Taxol Echocardiogram 10/27/2021: EF 60 to 65%  Chemo toxicities: 1. Very mild nausea 2. Fatigue    Return to clinic weekly for Taxol and every other week for follow up with me

## 2021-12-25 ENCOUNTER — Other Ambulatory Visit: Payer: Self-pay

## 2021-12-25 ENCOUNTER — Inpatient Hospital Stay (HOSPITAL_BASED_OUTPATIENT_CLINIC_OR_DEPARTMENT_OTHER): Payer: Medicaid Other | Admitting: Hematology and Oncology

## 2021-12-25 ENCOUNTER — Inpatient Hospital Stay: Payer: Medicaid Other

## 2021-12-25 ENCOUNTER — Inpatient Hospital Stay: Payer: Medicaid Other | Attending: Hematology and Oncology

## 2021-12-25 VITALS — BP 117/71 | HR 74 | Resp 17

## 2021-12-25 DIAGNOSIS — C50412 Malignant neoplasm of upper-outer quadrant of left female breast: Secondary | ICD-10-CM | POA: Insufficient documentation

## 2021-12-25 DIAGNOSIS — K5900 Constipation, unspecified: Secondary | ICD-10-CM | POA: Diagnosis not present

## 2021-12-25 DIAGNOSIS — Z5111 Encounter for antineoplastic chemotherapy: Secondary | ICD-10-CM | POA: Diagnosis present

## 2021-12-25 DIAGNOSIS — Z87891 Personal history of nicotine dependence: Secondary | ICD-10-CM | POA: Diagnosis not present

## 2021-12-25 DIAGNOSIS — Z170 Estrogen receptor positive status [ER+]: Secondary | ICD-10-CM | POA: Diagnosis present

## 2021-12-25 DIAGNOSIS — Z95828 Presence of other vascular implants and grafts: Secondary | ICD-10-CM

## 2021-12-25 DIAGNOSIS — R5383 Other fatigue: Secondary | ICD-10-CM | POA: Diagnosis not present

## 2021-12-25 LAB — CBC WITH DIFFERENTIAL (CANCER CENTER ONLY)
Abs Immature Granulocytes: 0.12 10*3/uL — ABNORMAL HIGH (ref 0.00–0.07)
Basophils Absolute: 0.1 10*3/uL (ref 0.0–0.1)
Basophils Relative: 1 %
Eosinophils Absolute: 0.1 10*3/uL (ref 0.0–0.5)
Eosinophils Relative: 2 %
HCT: 32.5 % — ABNORMAL LOW (ref 36.0–46.0)
Hemoglobin: 10.1 g/dL — ABNORMAL LOW (ref 12.0–15.0)
Immature Granulocytes: 2 %
Lymphocytes Relative: 16 %
Lymphs Abs: 1.2 10*3/uL (ref 0.7–4.0)
MCH: 27.3 pg (ref 26.0–34.0)
MCHC: 31.1 g/dL (ref 30.0–36.0)
MCV: 87.8 fL (ref 80.0–100.0)
Monocytes Absolute: 0.9 10*3/uL (ref 0.1–1.0)
Monocytes Relative: 11 %
Neutro Abs: 5.2 10*3/uL (ref 1.7–7.7)
Neutrophils Relative %: 68 %
Platelet Count: 311 10*3/uL (ref 150–400)
RBC: 3.7 MIL/uL — ABNORMAL LOW (ref 3.87–5.11)
RDW: 17.7 % — ABNORMAL HIGH (ref 11.5–15.5)
WBC Count: 7.6 10*3/uL (ref 4.0–10.5)
nRBC: 1.1 % — ABNORMAL HIGH (ref 0.0–0.2)

## 2021-12-25 LAB — CMP (CANCER CENTER ONLY)
ALT: 11 U/L (ref 0–44)
AST: 14 U/L — ABNORMAL LOW (ref 15–41)
Albumin: 3.9 g/dL (ref 3.5–5.0)
Alkaline Phosphatase: 90 U/L (ref 38–126)
Anion gap: 5 (ref 5–15)
BUN: 10 mg/dL (ref 6–20)
CO2: 28 mmol/L (ref 22–32)
Calcium: 9.3 mg/dL (ref 8.9–10.3)
Chloride: 105 mmol/L (ref 98–111)
Creatinine: 0.6 mg/dL (ref 0.44–1.00)
GFR, Estimated: >60 mL/min (ref >60)
Glucose, Bld: 106 mg/dL — ABNORMAL HIGH (ref 70–99)
Potassium: 3.7 mmol/L (ref 3.5–5.1)
Sodium: 138 mmol/L (ref 135–145)
Total Bilirubin: 0.3 mg/dL (ref 0.3–1.2)
Total Protein: 7.6 g/dL (ref 6.5–8.1)

## 2021-12-25 MED ORDER — FAMOTIDINE 20 MG IN NS 100 ML IVPB
20.0000 mg | Freq: Once | INTRAVENOUS | Status: AC
  Administered 2021-12-25: 20 mg via INTRAVENOUS

## 2021-12-25 MED ORDER — DIPHENHYDRAMINE HCL 50 MG/ML IJ SOLN
50.0000 mg | Freq: Once | INTRAMUSCULAR | Status: AC
  Administered 2021-12-25: 50 mg via INTRAVENOUS
  Filled 2021-12-25: qty 1

## 2021-12-25 MED ORDER — SODIUM CHLORIDE 0.9 % IV SOLN
10.0000 mg | Freq: Once | INTRAVENOUS | Status: AC
  Administered 2021-12-25: 10 mg via INTRAVENOUS
  Filled 2021-12-25: qty 10

## 2021-12-25 MED ORDER — SODIUM CHLORIDE 0.9% FLUSH
10.0000 mL | Freq: Once | INTRAVENOUS | Status: AC
  Administered 2021-12-25: 10 mL

## 2021-12-25 MED ORDER — SODIUM CHLORIDE 0.9 % IV SOLN: Freq: Once | INTRAVENOUS | Status: AC

## 2021-12-25 MED ORDER — SODIUM CHLORIDE 0.9 % IV SOLN
80.0000 mg/m2 | Freq: Once | INTRAVENOUS | Status: AC
  Administered 2021-12-25: 180 mg via INTRAVENOUS
  Filled 2021-12-25: qty 30

## 2021-12-25 NOTE — Patient Instructions (Addendum)
[facility]  Discharge Instructions: Thank you for choosing [facility] to provide your oncology and hematology care.   If you have a lab appointment with the [facility], please go directly to the [facility] and check in at the registration area.   Wear comfortable clothing and clothing appropriate for easy access to any Portacath or PICC line.   We strive to give you quality time with your provider. You may need to reschedule your appointment if you arrive late (15 or more minutes).  Arriving late affects you and other patients whose appointments are after yours.  Also, if you miss three or more appointments without notifying the office, you may be dismissed from the clinic at the providers discretion.      For prescription refill requests, have your pharmacy contact our office and allow 72 hours for refills to be completed.    Today you received the following chemotherapy and/or immunotherapy agent: Taxol     To help prevent nausea and vomiting after your treatment, we encourage you to take your nausea medication as directed.  BELOW ARE SYMPTOMS THAT SHOULD BE REPORTED IMMEDIATELY: *FEVER GREATER THAN 100.4 F (38 C) OR HIGHER *CHILLS OR SWEATING *NAUSEA AND VOMITING THAT IS NOT CONTROLLED WITH YOUR NAUSEA MEDICATION *UNUSUAL SHORTNESS OF BREATH *UNUSUAL BRUISING OR BLEEDING *URINARY PROBLEMS (pain or burning when urinating, or frequent urination) *BOWEL PROBLEMS (unusual diarrhea, constipation, pain near the anus) TENDERNESS IN MOUTH AND THROAT WITH OR WITHOUT PRESENCE OF ULCERS (sore throat, sores in mouth, or a toothache) UNUSUAL RASH, SWELLING OR PAIN  UNUSUAL VAGINAL DISCHARGE OR ITCHING   Items with * indicate a potential emergency and should be followed up as soon as possible or go to the Emergency Department if any problems should occur.  Please show the CHEMOTHERAPY ALERT CARD or IMMUNOTHERAPY ALERT CARD at check-in to the  Emergency Department and triage nurse.  Should you have questions after your visit or need to cancel or reschedule your appointment, please contact [facility]  Dept: [phone]  and follow the prompts.  Office hours are 8:00 a.m. to 4:30 p.m. Monday - Friday. Please note that voicemails left after 4:00 p.m. may not be returned until the following business day.  We are closed weekends and major holidays. You have access to a nurse at all times for urgent questions. Please call the main number to the clinic Dept: [phone] and follow the prompts.   For any non-urgent questions, you may also contact your provider using MyChart. We now offer e-Visits for anyone 5 and older to request care online for non-urgent symptoms. For details visit [email or web address].   Also download the MyChart app! Go to the app store, search "MyChart", open the app, select [facility], and log in with your MyChart username and password.  Due to Covid, a mask is required upon entering the hospital/clinic. If you do not have a mask, one will be given to you upon arrival. For doctor visits, patients may have 1 support person aged 34 or older with them. For treatment visits, patients cannot have anyone with them due to current Covid guidelines and our immunocompromised population.   Paclitaxel injection What is this medication? PACLITAXEL (PAK li TAX el) is a chemotherapy drug. It targets fast dividing cells, like cancer cells, and causes these cells to die. This medicine is used to treat ovarian cancer, breast cancer, lung cancer, Kaposi's sarcoma, and other cancers. This medicine may be  used for other purposes; ask your health care provider or pharmacist if you have questions. COMMON BRAND NAME(S): Onxol, Taxol What should I tell my care team before I take this medication? They need to know if you have any of these conditions: history of irregular heartbeat liver disease low blood  counts, like low white cell, platelet, or red cell counts lung or breathing disease, like asthma tingling of the fingers or toes, or other nerve disorder an unusual or allergic reaction to paclitaxel, alcohol, polyoxyethylated castor oil, other chemotherapy, other medicines, foods, dyes, or preservatives pregnant or trying to get pregnant breast-feeding How should I use this medication? This drug is given as an infusion into a vein. It is administered in a hospital or clinic by a specially trained health care professional. Talk to your pediatrician regarding the use of this medicine in children. Special care may be needed. Overdosage: If you think you have taken too much of this medicine contact a poison control center or emergency room at once. NOTE: This medicine is only for you. Do not share this medicine with others. What if I miss a dose? It is important not to miss your dose. Call your doctor or health care professional if you are unable to keep an appointment. What may interact with this medication? Do not take this medicine with any of the following medications: live virus vaccines This medicine may also interact with the following medications: antiviral medicines for hepatitis, HIV or AIDS certain antibiotics like erythromycin and clarithromycin certain medicines for fungal infections like ketoconazole and itraconazole certain medicines for seizures like carbamazepine, phenobarbital, phenytoin gemfibrozil nefazodone rifampin St. John's wort This list may not describe all possible interactions. Give your health care provider a list of all the medicines, herbs, non-prescription drugs, or dietary supplements you use. Also tell them if you smoke, drink alcohol, or use illegal drugs. Some items may interact with your medicine. What should I watch for while using this medication? Your condition will be monitored carefully while you are receiving this medicine. You will need important  blood work done while you are taking this medicine. This medicine can cause serious allergic reactions. To reduce your risk you will need to take other medicine(s) before treatment with this medicine. If you experience allergic reactions like skin rash, itching or hives, swelling of the face, lips, or tongue, tell your doctor or health care professional right away. In some cases, you may be given additional medicines to help with side effects. Follow all directions for their use. This drug may make you feel generally unwell. This is not uncommon, as chemotherapy can affect healthy cells as well as cancer cells. Report any side effects. Continue your course of treatment even though you feel ill unless your doctor tells you to stop. Call your doctor or health care professional for advice if you get a fever, chills or sore throat, or other symptoms of a cold or flu. Do not treat yourself. This drug decreases your body's ability to fight infections. Try to avoid being around people who are sick. This medicine may increase your risk to bruise or bleed. Call your doctor or health care professional if you notice any unusual bleeding. Be careful brushing and flossing your teeth or using a toothpick because you may get an infection or bleed more easily. If you have any dental work done, tell your dentist you are receiving this medicine. Avoid taking products that contain aspirin, acetaminophen, ibuprofen, naproxen, or ketoprofen unless instructed by your doctor.  These medicines may hide a fever. Do not become pregnant while taking this medicine. Women should inform their doctor if they wish to become pregnant or think they might be pregnant. There is a potential for serious side effects to an unborn child. Talk to your health care professional or pharmacist for more information. Do not breast-feed an infant while taking this medicine. Men are advised not to father a child while receiving this medicine. This product  may contain alcohol. Ask your pharmacist or healthcare provider if this medicine contains alcohol. Be sure to tell all healthcare providers you are taking this medicine. Certain medicines, like metronidazole and disulfiram, can cause an unpleasant reaction when taken with alcohol. The reaction includes flushing, headache, nausea, vomiting, sweating, and increased thirst. The reaction can last from 30 minutes to several hours. What side effects may I notice from receiving this medication? Side effects that you should report to your doctor or health care professional as soon as possible: allergic reactions like skin rash, itching or hives, swelling of the face, lips, or tongue breathing problems changes in vision fast, irregular heartbeat high or low blood pressure mouth sores pain, tingling, numbness in the hands or feet signs of decreased platelets or bleeding - bruising, pinpoint red spots on the skin, black, tarry stools, blood in the urine signs of decreased red blood cells - unusually weak or tired, feeling faint or lightheaded, falls signs of infection - fever or chills, cough, sore throat, pain or difficulty passing urine signs and symptoms of liver injury like dark yellow or brown urine; general ill feeling or flu-like symptoms; light-colored stools; loss of appetite; nausea; right upper belly pain; unusually weak or tired; yellowing of the eyes or skin swelling of the ankles, feet, hands unusually slow heartbeat Side effects that usually do not require medical attention (report to your doctor or health care professional if they continue or are bothersome): diarrhea hair loss loss of appetite muscle or joint pain nausea, vomiting pain, redness, or irritation at site where injected tiredness This list may not describe all possible side effects. Call your doctor for medical advice about side effects. You may report side effects to FDA at 1-800-FDA-1088. Where should I keep my  medication? This drug is given in a hospital or clinic and will not be stored at home. NOTE: This sheet is a summary. It may not cover all possible information. If you have questions about this medicine, talk to your doctor, pharmacist, or health care provider.  2022 Elsevier/Gold Standard (2021-08-22 00:00:00)

## 2021-12-25 NOTE — Progress Notes (Signed)
Patient Care Team: Elsie Stain, MD as PCP - General (Pulmonary Disease) Rockwell Germany, RN as Oncology Nurse Navigator Mauro Kaufmann, RN as Oncology Nurse Navigator Nicholas Lose, MD as Consulting Physician (Hematology and Oncology) Donnie Mesa, MD as Consulting Physician (General Surgery)  DIAGNOSIS:  Encounter Diagnosis  Name Primary?   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  Variants of uncertain significance detected in POT1 at c.476T>C (p.Met159Thr) and PRKAR1A at c.27T>G (p.Ser9Arg).  The report dates are October 19, 2021 and October 29, 2021, respectively.   The Multi-Cancer + RNA Panel offered by [facility] includes sequencing and/or deletion/duplication analysis of the following 84 genes:  AIP*, ALK, APC*, ATM*, AXIN2*, BAP1*, BARD1*, BLM*, BMPR1A*, BRCA1*, BRCA2*, BRIP1*, CASR, CDC73*, CDH1*, CDK4, CDKN1B*, CDKN1C*, CDKN2A, CEBPA, CHEK2*, CTNNA1*, DICER1*, DIS3L2*,  EGFR, EPCAM, FH*, FLCN*, GATA2*, GPC3, GREM1, HOXB13, HRAS, KIT, MAX*, MEN1*, MET, MITF, MLH1*, MSH2*, MSH3*, MSH6*, MUTYH*, NBN*, NF1*, NF2*, NTHL1*, PALB2*, PDGFRA, PHOX2B, PMS2*, POLD1*, POLE*, POT1*, PRKAR1A*, PTCH1*, PTEN*, RAD50*, RAD51C*, RAD51D*, RB1*, RECQL4, RET, RUNX1*, SDHA*, SDHAF2*, SDHB*, SDHC*, SDHD*, SMAD4*, SMARCA4*, SMARCB1*, SMARCE1*, STK11*, SUFU*, TERC, TERT, TMEM127*, Tp53*, TSC1*, TSC2*, VHL*, WRN*, and WT1.  RNA analysis is performed for * genes.   11/01/2021 -  Chemotherapy   Patient is on Treatment Plan : BREAST ADJUVANT DOSE DENSE AC q14d / PACLitaxel q7d       CHIEF COMPLIANT:   INTERVAL HISTORY: TICHINA KOEBEL is a   ALLERGIES:  has No Known Allergies.  MEDICATIONS:  Current Outpatient Medications  Medication Sig Dispense Refill   albuterol (PROVENTIL) (2.5 MG/3ML) 0.083% nebulizer solution Take 3 mLs (2.5 mg total) by nebulization every 6 (six) hours as needed for wheezing or shortness of breath. 150 mL 1   albuterol (VENTOLIN HFA) 108 (90 Base) MCG/ACT inhaler Inhale 1-2 puffs into the lungs every 6 (six) hours as needed for wheezing or shortness of breath. 18 g 0   cetirizine (ZYRTEC) 10 MG tablet Take 10 mg by mouth daily.     dexamethasone (DECADRON) 4 MG tablet Take 1 tablet (4 mg total) by mouth daily. (Take 1 tablet day after chemo and 1 tablet 2 days after chemo with food) 8 tablet 0   lidocaine-prilocaine (EMLA) cream Apply to affected area once 30 g 3   ondansetron (ZOFRAN) 8 MG tablet Take 1 tablet (8 mg total) by mouth 2 (two) times daily as needed. Start on the third day after chemotherapy. 30 tablet 1   prochlorperazine (COMPAZINE) 10 MG tablet Take 1 tablet (  10 mg total) by mouth every 6 (six) hours as needed (Nausea or vomiting). 30 tablet 1   No current facility-administered medications for this visit.    PHYSICAL EXAMINATION: ECOG PERFORMANCE STATUS: 1 - Symptomatic but completely ambulatory  Vitals:   12/25/21 1132  BP: (!) 122/57   Pulse: 87  Resp: 18  Temp: 97.8 F (36.6 C)  SpO2: 100%   Filed Weights   12/25/21 1132  Weight: 234 lb 3.2 oz (106.2 kg)      LABORATORY DATA:  I have reviewed the data as listed CMP Latest Ref Rng & Units 12/12/2021 11/27/2021 11/13/2021  Glucose 70 - 99 mg/dL 89 102(H) 114(H)  BUN 6 - 20 mg/dL $Remove'9 9 8  'byTnLbX$ Creatinine 0.44 - 1.00 mg/dL 0.58 0.66 0.77  Sodium 135 - 145 mmol/L 139 142 141  Potassium 3.5 - 5.1 mmol/L 3.7 3.4(L) 3.5  Chloride 98 - 111 mmol/L 106 109 107  CO2 22 - 32 mmol/L $RemoveB'28 25 25  'gVWXWEFO$ Calcium 8.9 - 10.3 mg/dL 9.1 8.2(L) 8.2(L)  Total Protein 6.5 - 8.1 g/dL 7.3 7.2 7.5  Total Bilirubin 0.3 - 1.2 mg/dL 0.3 0.3 0.3  Alkaline Phos 38 - 126 U/L 75 70 83  AST 15 - 41 U/L 16 13(L) 14(L)  ALT 0 - 44 U/L $Remo'13 12 11    'GNtIv$ Lab Results  Component Value Date   WBC 7.6 12/25/2021   HGB 10.1 (L) 12/25/2021   HCT 32.5 (L) 12/25/2021   MCV 87.8 12/25/2021   PLT 311 12/25/2021   NEUTROABS 5.2 12/25/2021    ASSESSMENT & PLAN:  Malignant neoplasm of upper-outer quadrant of left breast in female, estrogen receptor positive ([place]) 10/04/2021: Palpable left breast mass and left breast swelling for 2 months, mammogram revealed 9 cm abnormality at 2 o'clock position plus another mass at 8:00 measuring 1.3 cm: 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 Bone scan 10/23/2021: No evidence of bony metastases.  Left mandible activity benign   Treatment plan: 1.  Neoadjuvant chemotherapy with dose dense Adriamycin and Cytoxan followed by Taxol 2. modified radical mastectomy 3.  Adjuvant radiation 4.  Adjuvant antiestrogen therapy with ovarian function suppression plus + AI +  Verzinio ------------------------------------------------------------------------------------------------------------------------------------------- Current treatment: Completed 4 cycles of dose dense Adriamycin and Cytoxan, Today is cycle 1 Taxol Echocardiogram 10/27/2021: EF 60 to 65%   Chemo toxicities: Very mild nausea Fatigue  Chemotherapy-induced anemia: Hemoglobin 10.1 stable  She has not had a menstrual cycle this month.  She is very happy about that.   Return to clinic weekly for Taxol and every other week for follow up with me   No orders of the defined types were placed in this encounter.  The patient has a good understanding of the overall plan. she agrees with it. she will call with any problems that may develop before the next visit here. Total time spent: 30 mins including face to face time and time spent for planning, charting and co-ordination of care   Harriette Ohara, MD 12/25/21

## 2021-12-26 ENCOUNTER — Telehealth: Payer: Self-pay | Admitting: Hematology and Oncology

## 2021-12-26 ENCOUNTER — Telehealth: Payer: Self-pay | Admitting: *Deleted

## 2021-12-26 NOTE — Telephone Encounter (Signed)
Called pt & left message to return call to discuss how she did with her treatment.

## 2021-12-26 NOTE — Telephone Encounter (Signed)
Cancelled appointments per 1/9 los. Patient is aware.

## 2021-12-26 NOTE — Telephone Encounter (Signed)
-----   Message from Anastasia Pall, RN sent at 12/25/2021  3:47 PM EST ----- Regarding: 1st time Taxol follow-up, being seen by Dr. Lindi Adie Please follow-up with patient for 1st time taxol, seen by Dr. Lindi Adie. Tolerated infusion well.

## 2021-12-27 ENCOUNTER — Ambulatory Visit: Payer: No Typology Code available for payment source

## 2021-12-29 MED FILL — Dexamethasone Sodium Phosphate Inj 100 MG/10ML: INTRAMUSCULAR | Qty: 1 | Status: AC

## 2022-01-01 ENCOUNTER — Inpatient Hospital Stay: Payer: Medicaid Other

## 2022-01-01 ENCOUNTER — Encounter: Payer: Self-pay | Admitting: Adult Health

## 2022-01-01 ENCOUNTER — Other Ambulatory Visit: Payer: Self-pay

## 2022-01-01 ENCOUNTER — Encounter: Payer: Self-pay | Admitting: Hematology and Oncology

## 2022-01-01 ENCOUNTER — Inpatient Hospital Stay (HOSPITAL_BASED_OUTPATIENT_CLINIC_OR_DEPARTMENT_OTHER): Payer: Medicaid Other | Admitting: Adult Health

## 2022-01-01 VITALS — BP 122/57 | HR 68 | Temp 97.3°F | Resp 17 | Wt 235.0 lb

## 2022-01-01 DIAGNOSIS — C50412 Malignant neoplasm of upper-outer quadrant of left female breast: Secondary | ICD-10-CM

## 2022-01-01 DIAGNOSIS — Z170 Estrogen receptor positive status [ER+]: Secondary | ICD-10-CM

## 2022-01-01 DIAGNOSIS — Z5111 Encounter for antineoplastic chemotherapy: Secondary | ICD-10-CM | POA: Diagnosis not present

## 2022-01-01 DIAGNOSIS — Z95828 Presence of other vascular implants and grafts: Secondary | ICD-10-CM

## 2022-01-01 LAB — CMP (CANCER CENTER ONLY)
ALT: 18 U/L (ref 0–44)
AST: 20 U/L (ref 15–41)
Albumin: 3.7 g/dL (ref 3.5–5.0)
Alkaline Phosphatase: 73 U/L (ref 38–126)
Anion gap: 6 (ref 5–15)
BUN: 10 mg/dL (ref 6–20)
CO2: 27 mmol/L (ref 22–32)
Calcium: 9 mg/dL (ref 8.9–10.3)
Chloride: 106 mmol/L (ref 98–111)
Creatinine: 0.56 mg/dL (ref 0.44–1.00)
GFR, Estimated: >60 mL/min (ref >60)
Glucose, Bld: 89 mg/dL (ref 70–99)
Potassium: 4 mmol/L (ref 3.5–5.1)
Sodium: 139 mmol/L (ref 135–145)
Total Bilirubin: 0.3 mg/dL (ref 0.3–1.2)
Total Protein: 7.3 g/dL (ref 6.5–8.1)

## 2022-01-01 LAB — CBC WITH DIFFERENTIAL (CANCER CENTER ONLY)
Abs Immature Granulocytes: 0.04 10*3/uL (ref 0.00–0.07)
Basophils Absolute: 0.1 10*3/uL (ref 0.0–0.1)
Basophils Relative: 1 %
Eosinophils Absolute: 0.1 10*3/uL (ref 0.0–0.5)
Eosinophils Relative: 2 %
HCT: 28 % — ABNORMAL LOW (ref 36.0–46.0)
Hemoglobin: 8.8 g/dL — ABNORMAL LOW (ref 12.0–15.0)
Immature Granulocytes: 1 %
Lymphocytes Relative: 16 %
Lymphs Abs: 1.2 10*3/uL (ref 0.7–4.0)
MCH: 27.5 pg (ref 26.0–34.0)
MCHC: 31.4 g/dL (ref 30.0–36.0)
MCV: 87.5 fL (ref 80.0–100.0)
Monocytes Absolute: 0.8 10*3/uL (ref 0.1–1.0)
Monocytes Relative: 10 %
Neutro Abs: 5.2 10*3/uL (ref 1.7–7.7)
Neutrophils Relative %: 70 %
Platelet Count: 447 10*3/uL — ABNORMAL HIGH (ref 150–400)
RBC: 3.2 MIL/uL — ABNORMAL LOW (ref 3.87–5.11)
RDW: 17.1 % — ABNORMAL HIGH (ref 11.5–15.5)
WBC Count: 7.4 10*3/uL (ref 4.0–10.5)
nRBC: 0.5 % — ABNORMAL HIGH (ref 0.0–0.2)

## 2022-01-01 MED ORDER — SODIUM CHLORIDE 0.9 % IV SOLN
80.0000 mg/m2 | Freq: Once | INTRAVENOUS | Status: AC
  Administered 2022-01-01: 180 mg via INTRAVENOUS
  Filled 2022-01-01: qty 30

## 2022-01-01 MED ORDER — DIPHENHYDRAMINE HCL 50 MG/ML IJ SOLN
25.0000 mg | Freq: Once | INTRAMUSCULAR | Status: AC
  Administered 2022-01-01: 25 mg via INTRAVENOUS
  Filled 2022-01-01: qty 1

## 2022-01-01 MED ORDER — SODIUM CHLORIDE 0.9 % IV SOLN: Freq: Once | INTRAVENOUS | Status: AC

## 2022-01-01 MED ORDER — HEPARIN SOD (PORK) LOCK FLUSH 100 UNIT/ML IV SOLN
500.0000 [IU] | Freq: Once | INTRAVENOUS | Status: AC | PRN
  Administered 2022-01-01: 500 [IU]

## 2022-01-01 MED ORDER — SODIUM CHLORIDE 0.9% FLUSH
10.0000 mL | INTRAVENOUS | Status: DC | PRN
  Administered 2022-01-01: 10 mL

## 2022-01-01 MED ORDER — SODIUM CHLORIDE 0.9 % IV SOLN
10.0000 mg | Freq: Once | INTRAVENOUS | Status: AC
  Administered 2022-01-01: 10 mg via INTRAVENOUS
  Filled 2022-01-01: qty 10

## 2022-01-01 MED ORDER — FAMOTIDINE 20 MG IN NS 100 ML IVPB
20.0000 mg | Freq: Once | INTRAVENOUS | Status: AC
  Administered 2022-01-01: 20 mg via INTRAVENOUS
  Filled 2022-01-01: qty 100

## 2022-01-01 MED ORDER — SODIUM CHLORIDE 0.9% FLUSH
10.0000 mL | Freq: Once | INTRAVENOUS | Status: AC
  Administered 2022-01-01: 10 mL

## 2022-01-01 NOTE — Assessment & Plan Note (Signed)
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 Bone scan 10/23/2021: No evidence of bony metastases. Left mandible activity benign  Treatment plan: 1.Neoadjuvant chemotherapy with dose dense Adriamycin and Cytoxan followed by Taxol 2.modified radical mastectomy 3.Adjuvant radiation 4.Adjuvant antiestrogen therapy with ovarian function suppression plus + AI + Verzinio ------------------------------------------------------------------------------------------------------------------------------------------- Current treatment: Completed 4 cycles of dose dense Adriamycin and Cytoxan, Today is cycle 2 Taxol Echocardiogram 10/27/2021: EF 60 to 65%  Chemo toxicities: 1. Somnolence with IV Benadryl: Dose decreased to 25 mg today. 2. Constipation: Recommended increased water intake and stool softener to help with constipation.   3. Mild fatigue   Return to clinic weekly for Taxol and every other week for follow-up
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
## 2022-01-05 ENCOUNTER — Encounter: Payer: Self-pay | Admitting: *Deleted

## 2022-01-05 MED FILL — Dexamethasone Sodium Phosphate Inj 100 MG/10ML: INTRAMUSCULAR | Qty: 1 | Status: AC

## 2022-01-08 ENCOUNTER — Other Ambulatory Visit: Payer: Self-pay

## 2022-01-08 ENCOUNTER — Inpatient Hospital Stay: Payer: Medicaid Other

## 2022-01-08 VITALS — BP 122/58 | HR 65 | Temp 98.0°F | Resp 16 | Ht 70.0 in | Wt 238.4 lb

## 2022-01-08 DIAGNOSIS — Z95828 Presence of other vascular implants and grafts: Secondary | ICD-10-CM

## 2022-01-08 DIAGNOSIS — C50412 Malignant neoplasm of upper-outer quadrant of left female breast: Secondary | ICD-10-CM

## 2022-01-08 DIAGNOSIS — Z5111 Encounter for antineoplastic chemotherapy: Secondary | ICD-10-CM | POA: Diagnosis not present

## 2022-01-08 DIAGNOSIS — Z170 Estrogen receptor positive status [ER+]: Secondary | ICD-10-CM

## 2022-01-08 LAB — CMP (CANCER CENTER ONLY)
ALT: 19 U/L (ref 0–44)
AST: 18 U/L (ref 15–41)
Albumin: 3.8 g/dL (ref 3.5–5.0)
Alkaline Phosphatase: 75 U/L (ref 38–126)
Anion gap: 6 (ref 5–15)
BUN: 12 mg/dL (ref 6–20)
CO2: 26 mmol/L (ref 22–32)
Calcium: 9.1 mg/dL (ref 8.9–10.3)
Chloride: 107 mmol/L (ref 98–111)
Creatinine: 0.56 mg/dL (ref 0.44–1.00)
GFR, Estimated: >60 mL/min (ref >60)
Glucose, Bld: 102 mg/dL — ABNORMAL HIGH (ref 70–99)
Potassium: 3.6 mmol/L (ref 3.5–5.1)
Sodium: 139 mmol/L (ref 135–145)
Total Bilirubin: 0.3 mg/dL (ref 0.3–1.2)
Total Protein: 7.3 g/dL (ref 6.5–8.1)

## 2022-01-08 LAB — CBC WITH DIFFERENTIAL (CANCER CENTER ONLY)
Abs Immature Granulocytes: 0.02 10*3/uL (ref 0.00–0.07)
Basophils Absolute: 0.1 10*3/uL (ref 0.0–0.1)
Basophils Relative: 2 %
Eosinophils Absolute: 0.2 10*3/uL (ref 0.0–0.5)
Eosinophils Relative: 4 %
HCT: 29.1 % — ABNORMAL LOW (ref 36.0–46.0)
Hemoglobin: 9.1 g/dL — ABNORMAL LOW (ref 12.0–15.0)
Immature Granulocytes: 0 %
Lymphocytes Relative: 20 %
Lymphs Abs: 1.1 10*3/uL (ref 0.7–4.0)
MCH: 27.7 pg (ref 26.0–34.0)
MCHC: 31.3 g/dL (ref 30.0–36.0)
MCV: 88.4 fL (ref 80.0–100.0)
Monocytes Absolute: 0.4 10*3/uL (ref 0.1–1.0)
Monocytes Relative: 7 %
Neutro Abs: 3.5 10*3/uL (ref 1.7–7.7)
Neutrophils Relative %: 67 %
Platelet Count: 503 10*3/uL — ABNORMAL HIGH (ref 150–400)
RBC: 3.29 MIL/uL — ABNORMAL LOW (ref 3.87–5.11)
RDW: 17.7 % — ABNORMAL HIGH (ref 11.5–15.5)
WBC Count: 5.2 10*3/uL (ref 4.0–10.5)
nRBC: 0 % (ref 0.0–0.2)

## 2022-01-08 MED ORDER — SODIUM CHLORIDE 0.9% FLUSH
10.0000 mL | INTRAVENOUS | Status: DC | PRN
  Administered 2022-01-08: 10 mL

## 2022-01-08 MED ORDER — SODIUM CHLORIDE 0.9 % IV SOLN
10.0000 mg | Freq: Once | INTRAVENOUS | Status: AC
  Administered 2022-01-08: 10 mg via INTRAVENOUS
  Filled 2022-01-08: qty 10

## 2022-01-08 MED ORDER — HEPARIN SOD (PORK) LOCK FLUSH 100 UNIT/ML IV SOLN
500.0000 [IU] | Freq: Once | INTRAVENOUS | Status: AC | PRN
  Administered 2022-01-08: 500 [IU]

## 2022-01-08 MED ORDER — DIPHENHYDRAMINE HCL 50 MG/ML IJ SOLN
25.0000 mg | Freq: Once | INTRAMUSCULAR | Status: AC
  Administered 2022-01-08: 25 mg via INTRAVENOUS
  Filled 2022-01-08: qty 1

## 2022-01-08 MED ORDER — SODIUM CHLORIDE 0.9 % IV SOLN: Freq: Once | INTRAVENOUS | Status: AC

## 2022-01-08 MED ORDER — SODIUM CHLORIDE 0.9% FLUSH
10.0000 mL | Freq: Once | INTRAVENOUS | Status: AC
  Administered 2022-01-08: 10 mL

## 2022-01-08 MED ORDER — SODIUM CHLORIDE 0.9 % IV SOLN
80.0000 mg/m2 | Freq: Once | INTRAVENOUS | Status: AC
  Administered 2022-01-08: 180 mg via INTRAVENOUS
  Filled 2022-01-08: qty 30

## 2022-01-08 MED ORDER — FAMOTIDINE 20 MG IN NS 100 ML IVPB
20.0000 mg | Freq: Once | INTRAVENOUS | Status: AC
  Administered 2022-01-08: 20 mg via INTRAVENOUS
  Filled 2022-01-08: qty 100

## 2022-01-08 NOTE — Patient Instructions (Signed)
[facility]  Discharge Instructions: Thank you for choosing [facility] to provide your oncology and hematology care.   If you have a lab appointment with the [facility], please go directly to the [facility] and check in at the registration area.   Wear comfortable clothing and clothing appropriate for easy access to any Portacath or PICC line.   We strive to give you quality time with your provider. You may need to reschedule your appointment if you arrive late (15 or more minutes).  Arriving late affects you and other patients whose appointments are after yours.  Also, if you miss three or more appointments without notifying the office, you may be dismissed from the clinic at the providers discretion.      For prescription refill requests, have your pharmacy contact our office and allow 72 hours for refills to be completed.    Today you received the following chemotherapy and/or immunotherapy agents: Taxol.      To help prevent nausea and vomiting after your treatment, we encourage you to take your nausea medication as directed.  BELOW ARE SYMPTOMS THAT SHOULD BE REPORTED IMMEDIATELY: *FEVER GREATER THAN 100.4 F (38 C) OR HIGHER *CHILLS OR SWEATING *NAUSEA AND VOMITING THAT IS NOT CONTROLLED WITH YOUR NAUSEA MEDICATION *UNUSUAL SHORTNESS OF BREATH *UNUSUAL BRUISING OR BLEEDING *URINARY PROBLEMS (pain or burning when urinating, or frequent urination) *BOWEL PROBLEMS (unusual diarrhea, constipation, pain near the anus) TENDERNESS IN MOUTH AND THROAT WITH OR WITHOUT PRESENCE OF ULCERS (sore throat, sores in mouth, or a toothache) UNUSUAL RASH, SWELLING OR PAIN  UNUSUAL VAGINAL DISCHARGE OR ITCHING   Items with * indicate a potential emergency and should be followed up as soon as possible or go to the Emergency Department if any problems should occur.  Please show the CHEMOTHERAPY ALERT CARD or IMMUNOTHERAPY ALERT CARD at check-in to the  Emergency Department and triage nurse.  Should you have questions after your visit or need to cancel or reschedule your appointment, please contact [facility]  Dept: [phone]  and follow the prompts.  Office hours are 8:00 a.m. to 4:30 p.m. Monday - Friday. Please note that voicemails left after 4:00 p.m. may not be returned until the following business day.  We are closed weekends and major holidays. You have access to a nurse at all times for urgent questions. Please call the main number to the clinic Dept: [phone] and follow the prompts.   For any non-urgent questions, you may also contact your provider using MyChart. We now offer e-Visits for anyone 74 and older to request care online for non-urgent symptoms. For details visit [email or web address].   Also download the MyChart app! Go to the app store, search "MyChart", open the app, select [facility], and log in with your MyChart username and password.  Due to Covid, a mask is required upon entering the hospital/clinic. If you do not have a mask, one will be given to you upon arrival. For doctor visits, patients may have 1 support person aged 58 or older with them. For treatment visits, patients cannot have anyone with them due to current Covid guidelines and our immunocompromised population.

## 2022-01-12 MED FILL — Dexamethasone Sodium Phosphate Inj 100 MG/10ML: INTRAMUSCULAR | Qty: 1 | Status: AC

## 2022-01-13 NOTE — Progress Notes (Signed)
Patient Care Team: Elsie Stain, MD as PCP - General (Pulmonary Disease) Rockwell Germany, RN as Oncology Nurse Navigator Mauro Kaufmann, RN as Oncology Nurse Navigator Nicholas Lose, MD as Consulting Physician (Hematology and Oncology) Donnie Mesa, MD as Consulting Physician (General Surgery)  DIAGNOSIS:    ICD-10-CM   1. Malignant neoplasm of upper-outer quadrant of left breast in female, estrogen receptor positive ([place])  C50.412    Z17.0       SUMMARY OF ONCOLOGIC HISTORY: Oncology History  Malignant neoplasm of upper-outer quadrant of left breast in female, estrogen receptor positive ([place])  10/04/2021 Initial Diagnosis   Palpable left breast mass and left breast swelling for 2 months, mammogram revealed 9 cm abnormality at 2 o'clock position plus another mass at 8:00 measuring 1.3 cm: Biopsy of both revealed grade 3 IDC ER 95%, PR 0%, HER2 negative, Ki-67 40%, 5 abnormal lymph nodes: Biopsy of 1 was positive (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  Variants of uncertain significance detected in POT1 at c.476T>C (p.Met159Thr) and PRKAR1A at c.27T>G (p.Ser9Arg).  The report dates are October 19, 2021 and October 29, 2021, respectively.   The Multi-Cancer + RNA Panel offered by [facility] includes sequencing and/or deletion/duplication analysis of the following 84 genes:  AIP*, ALK, APC*, ATM*, AXIN2*, BAP1*, BARD1*, BLM*, BMPR1A*, BRCA1*, BRCA2*, BRIP1*, CASR, CDC73*, CDH1*, CDK4, CDKN1B*, CDKN1C*, CDKN2A, CEBPA, CHEK2*, CTNNA1*, DICER1*, DIS3L2*,  EGFR, EPCAM, FH*, FLCN*, GATA2*, GPC3, GREM1, HOXB13, HRAS, KIT, MAX*, MEN1*, MET, MITF, MLH1*, MSH2*, MSH3*, MSH6*, MUTYH*, NBN*, NF1*, NF2*, NTHL1*, PALB2*, PDGFRA, PHOX2B, PMS2*, POLD1*, POLE*, POT1*, PRKAR1A*, PTCH1*, PTEN*, RAD50*, RAD51C*, RAD51D*, RB1*, RECQL4, RET, RUNX1*, SDHA*, SDHAF2*, SDHB*, SDHC*, SDHD*, SMAD4*, SMARCA4*, SMARCB1*, SMARCE1*, STK11*, SUFU*, TERC, TERT, TMEM127*, Tp53*, TSC1*, TSC2*, VHL*, WRN*, and WT1.  RNA analysis is performed for * genes.   11/01/2021 -  Chemotherapy   Patient is on Treatment Plan : BREAST ADJUVANT DOSE DENSE AC q14d / PACLitaxel q7d       CHIEF COMPLIANT: Cycle 4 Taxol  INTERVAL HISTORY: IYAUNA Morton is a 41 y.o. with above-mentioned history of breast cancer currently on chemotherapy with Taxol. She presents to the clinic today for treatment.  Denies peripheral neuropathy.  Denies any nausea or vomiting.  Skin discoloration of the hands.  ALLERGIES:  has No Known Allergies.  MEDICATIONS:  Current Outpatient Medications  Medication Sig Dispense Refill   albuterol (PROVENTIL) (2.5 MG/3ML) 0.083% nebulizer solution Take 3 mLs (2.5 mg total) by nebulization every 6 (six) hours as needed for wheezing or shortness of breath. 150 mL 1   albuterol (VENTOLIN HFA) 108 (90 Base) MCG/ACT inhaler Inhale 1-2 puffs into the lungs every 6 (six) hours as needed for wheezing or shortness of breath. 18 g 0   cetirizine (ZYRTEC) 10 MG tablet Take 10 mg by mouth daily.     lidocaine-prilocaine (EMLA) cream Apply to affected area once 30 g 3   ondansetron (ZOFRAN) 8 MG tablet Take 1 tablet (8 mg total) by mouth 2 (two) times daily as needed. Start on the third day after chemotherapy. 30 tablet  1   prochlorperazine (COMPAZINE) 10 MG tablet Take 1 tablet (10 mg total) by mouth every 6 (six) hours as needed (Nausea or vomiting). 30 tablet 1   No current facility-administered medications for this visit.    PHYSICAL EXAMINATION: ECOG PERFORMANCE STATUS: 1 -  Symptomatic but completely ambulatory  Vitals:   01/15/22 1026  BP: (!) 119/49  Pulse: 79  Resp: 18  Temp: 98.1 F (36.7 C)  SpO2: 100%   Filed Weights   01/15/22 1026  Weight: 234 lb 5 oz (106.3 kg)    LABORATORY DATA:  I have reviewed the data as listed CMP Latest Ref Rng & Units 01/08/2022 01/01/2022 12/25/2021  Glucose 70 - 99 mg/dL 102(H) 89 106(H)  BUN 6 - 20 mg/dL $Remove'12 10 10  'wJJJhkI$ Creatinine 0.44 - 1.00 mg/dL 0.56 0.56 0.60  Sodium 135 - 145 mmol/L 139 139 138  Potassium 3.5 - 5.1 mmol/L 3.6 4.0 3.7  Chloride 98 - 111 mmol/L 107 106 105  CO2 22 - 32 mmol/L $RemoveB'26 27 28  'nCzgifmw$ Calcium 8.9 - 10.3 mg/dL 9.1 9.0 9.3  Total Protein 6.5 - 8.1 g/dL 7.3 7.3 7.6  Total Bilirubin 0.3 - 1.2 mg/dL 0.3 0.3 0.3  Alkaline Phos 38 - 126 U/L 75 73 90  AST 15 - 41 U/L 18 20 14(L)  ALT 0 - 44 U/L $Remo'19 18 11    'rjMYX$ Lab Results  Component Value Date   WBC 5.0 01/15/2022   HGB 9.4 (L) 01/15/2022   HCT 30.1 (L) 01/15/2022   MCV 88.0 01/15/2022   PLT 433 (H) 01/15/2022   NEUTROABS 3.3 01/15/2022    ASSESSMENT & PLAN:  Malignant neoplasm of upper-outer quadrant of left breast in female, estrogen receptor positive ([place]) 10/04/2021: Palpable left breast mass and left breast swelling for 2 months, mammogram revealed 9 cm abnormality at 2 o'clock position plus another mass at 8:00 measuring 1.3 cm: 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 Bone scan 10/23/2021: No evidence of bony metastases.  Left mandible activity benign   Treatment plan: 1.  Neoadjuvant chemotherapy with dose dense Adriamycin and Cytoxan followed by Taxol 2. modified radical mastectomy 3.  Adjuvant radiation 4.  Adjuvant antiestrogen therapy with ovarian function suppression plus + AI +  Verzinio ------------------------------------------------------------------------------------------------------------------------------------------- Current treatment: Completed 4 cycles of dose dense Adriamycin and Cytoxan, Today is cycle 4 Taxol Echocardiogram 10/27/2021: EF 60 to 65%   Chemo toxicities: Fatigue  Chemotherapy-induced anemia: Hemoglobin is improving slowly to 9.4 today Denies any peripheral neuropathy. Discoloration of the hands is present.   Return to clinic weekly for Taxol and every other week for follow up with me    No orders of the defined types were placed in this encounter.  The patient has a good understanding of the overall plan. she agrees with it. she will call with any problems that may develop before the next visit here.  Total time spent: 30 mins including face to face time and time spent for planning, charting and coordination of care  Rulon Eisenmenger, MD, MPH 01/15/2022  I, Thana Ates, am acting as scribe for Dr. Nicholas Lose.  I have reviewed the above documentation for accuracy and completeness, and I agree with the above.

## 2022-01-15 ENCOUNTER — Inpatient Hospital Stay: Payer: Medicaid Other

## 2022-01-15 ENCOUNTER — Other Ambulatory Visit: Payer: Self-pay

## 2022-01-15 ENCOUNTER — Inpatient Hospital Stay (HOSPITAL_BASED_OUTPATIENT_CLINIC_OR_DEPARTMENT_OTHER): Payer: Medicaid Other | Admitting: Hematology and Oncology

## 2022-01-15 DIAGNOSIS — Z170 Estrogen receptor positive status [ER+]: Secondary | ICD-10-CM

## 2022-01-15 DIAGNOSIS — C50412 Malignant neoplasm of upper-outer quadrant of left female breast: Secondary | ICD-10-CM

## 2022-01-15 DIAGNOSIS — Z5111 Encounter for antineoplastic chemotherapy: Secondary | ICD-10-CM | POA: Diagnosis not present

## 2022-01-15 DIAGNOSIS — Z95828 Presence of other vascular implants and grafts: Secondary | ICD-10-CM

## 2022-01-15 LAB — CBC WITH DIFFERENTIAL (CANCER CENTER ONLY)
Abs Immature Granulocytes: 0.02 10*3/uL (ref 0.00–0.07)
Basophils Absolute: 0.1 10*3/uL (ref 0.0–0.1)
Basophils Relative: 2 %
Eosinophils Absolute: 0.2 10*3/uL (ref 0.0–0.5)
Eosinophils Relative: 3 %
HCT: 30.1 % — ABNORMAL LOW (ref 36.0–46.0)
Hemoglobin: 9.4 g/dL — ABNORMAL LOW (ref 12.0–15.0)
Immature Granulocytes: 0 %
Lymphocytes Relative: 20 %
Lymphs Abs: 1 10*3/uL (ref 0.7–4.0)
MCH: 27.5 pg (ref 26.0–34.0)
MCHC: 31.2 g/dL (ref 30.0–36.0)
MCV: 88 fL (ref 80.0–100.0)
Monocytes Absolute: 0.4 10*3/uL (ref 0.1–1.0)
Monocytes Relative: 8 %
Neutro Abs: 3.3 10*3/uL (ref 1.7–7.7)
Neutrophils Relative %: 67 %
Platelet Count: 433 10*3/uL — ABNORMAL HIGH (ref 150–400)
RBC: 3.42 MIL/uL — ABNORMAL LOW (ref 3.87–5.11)
RDW: 17.7 % — ABNORMAL HIGH (ref 11.5–15.5)
WBC Count: 5 10*3/uL (ref 4.0–10.5)
nRBC: 0 % (ref 0.0–0.2)

## 2022-01-15 LAB — CMP (CANCER CENTER ONLY)
ALT: 15 U/L (ref 0–44)
AST: 16 U/L (ref 15–41)
Albumin: 3.8 g/dL (ref 3.5–5.0)
Alkaline Phosphatase: 69 U/L (ref 38–126)
Anion gap: 6 (ref 5–15)
BUN: 12 mg/dL (ref 6–20)
CO2: 27 mmol/L (ref 22–32)
Calcium: 9.1 mg/dL (ref 8.9–10.3)
Chloride: 106 mmol/L (ref 98–111)
Creatinine: 0.61 mg/dL (ref 0.44–1.00)
GFR, Estimated: >60 mL/min (ref >60)
Glucose, Bld: 86 mg/dL (ref 70–99)
Potassium: 4 mmol/L (ref 3.5–5.1)
Sodium: 139 mmol/L (ref 135–145)
Total Bilirubin: 0.4 mg/dL (ref 0.3–1.2)
Total Protein: 7.4 g/dL (ref 6.5–8.1)

## 2022-01-15 MED ORDER — FAMOTIDINE IN NACL 20-0.9 MG/50ML-% IV SOLN
20.0000 mg | Freq: Once | INTRAVENOUS | Status: AC
  Administered 2022-01-15: 20 mg via INTRAVENOUS
  Filled 2022-01-15: qty 50

## 2022-01-15 MED ORDER — SODIUM CHLORIDE 0.9% FLUSH
10.0000 mL | Freq: Once | INTRAVENOUS | Status: AC
  Administered 2022-01-15: 10 mL

## 2022-01-15 MED ORDER — HEPARIN SOD (PORK) LOCK FLUSH 100 UNIT/ML IV SOLN
500.0000 [IU] | Freq: Once | INTRAVENOUS | Status: AC | PRN
  Administered 2022-01-15: 500 [IU]

## 2022-01-15 MED ORDER — SODIUM CHLORIDE 0.9 % IV SOLN
10.0000 mg | Freq: Once | INTRAVENOUS | Status: AC
  Administered 2022-01-15: 10 mg via INTRAVENOUS
  Filled 2022-01-15: qty 10

## 2022-01-15 MED ORDER — SODIUM CHLORIDE 0.9 % IV SOLN: Freq: Once | INTRAVENOUS | Status: AC

## 2022-01-15 MED ORDER — SODIUM CHLORIDE 0.9 % IV SOLN
80.0000 mg/m2 | Freq: Once | INTRAVENOUS | Status: AC
  Administered 2022-01-15: 180 mg via INTRAVENOUS
  Filled 2022-01-15: qty 30

## 2022-01-15 MED ORDER — SODIUM CHLORIDE 0.9% FLUSH
10.0000 mL | INTRAVENOUS | Status: DC | PRN
  Administered 2022-01-15: 10 mL

## 2022-01-15 MED ORDER — DIPHENHYDRAMINE HCL 50 MG/ML IJ SOLN
25.0000 mg | Freq: Once | INTRAMUSCULAR | Status: AC
  Administered 2022-01-15: 25 mg via INTRAVENOUS
  Filled 2022-01-15: qty 1

## 2022-01-15 NOTE — Patient Instructions (Signed)
[facility]  Discharge Instructions: Thank you for choosing [facility] to provide your oncology and hematology care.   If you have a lab appointment with the [facility], please go directly to the [facility] and check in at the registration area.   Wear comfortable clothing and clothing appropriate for easy access to any Portacath or PICC line.   We strive to give you quality time with your provider. You may need to reschedule your appointment if you arrive late (15 or more minutes).  Arriving late affects you and other patients whose appointments are after yours.  Also, if you miss three or more appointments without notifying the office, you may be dismissed from the clinic at the providers discretion.      For prescription refill requests, have your pharmacy contact our office and allow 72 hours for refills to be completed.    Today you received the following chemotherapy and/or immunotherapy agents: Taxol.      To help prevent nausea and vomiting after your treatment, we encourage you to take your nausea medication as directed.  BELOW ARE SYMPTOMS THAT SHOULD BE REPORTED IMMEDIATELY: *FEVER GREATER THAN 100.4 F (38 C) OR HIGHER *CHILLS OR SWEATING *NAUSEA AND VOMITING THAT IS NOT CONTROLLED WITH YOUR NAUSEA MEDICATION *UNUSUAL SHORTNESS OF BREATH *UNUSUAL BRUISING OR BLEEDING *URINARY PROBLEMS (pain or burning when urinating, or frequent urination) *BOWEL PROBLEMS (unusual diarrhea, constipation, pain near the anus) TENDERNESS IN MOUTH AND THROAT WITH OR WITHOUT PRESENCE OF ULCERS (sore throat, sores in mouth, or a toothache) UNUSUAL RASH, SWELLING OR PAIN  UNUSUAL VAGINAL DISCHARGE OR ITCHING   Items with * indicate a potential emergency and should be followed up as soon as possible or go to the Emergency Department if any problems should occur.  Please show the CHEMOTHERAPY ALERT CARD or IMMUNOTHERAPY ALERT CARD at check-in to the  Emergency Department and triage nurse.  Should you have questions after your visit or need to cancel or reschedule your appointment, please contact [facility]  Dept: [phone]  and follow the prompts.  Office hours are 8:00 a.m. to 4:30 p.m. Monday - Friday. Please note that voicemails left after 4:00 p.m. may not be returned until the following business day.  We are closed weekends and major holidays. You have access to a nurse at all times for urgent questions. Please call the main number to the clinic Dept: [phone] and follow the prompts.   For any non-urgent questions, you may also contact your provider using MyChart. We now offer e-Visits for anyone 43 and older to request care online for non-urgent symptoms. For details visit [email or web address].   Also download the MyChart app! Go to the app store, search "MyChart", open the app, select , and log in with your MyChart username and password.  Due to Covid, a mask is required upon entering the hospital/clinic. If you do not have a mask, one will be given to you upon arrival. For doctor visits, patients may have 1 support person aged 43 or older with them. For treatment visits, patients cannot have anyone with them due to current Covid guidelines and our immunocompromised population.

## 2022-01-15 NOTE — Assessment & Plan Note (Signed)
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 Bone scan 10/23/2021: No evidence of bony metastases. Left mandible activity benign  Treatment plan: 1.Neoadjuvant chemotherapy with dose dense Adriamycin and Cytoxan followed by Taxol 2.modified radical mastectomy 3.Adjuvant radiation 4.Adjuvant antiestrogen therapy with ovarian function suppression plus + AI + Verzinio ------------------------------------------------------------------------------------------------------------------------------------------- Current treatment:Completed 4 cycles of dose dense Adriamycin and Cytoxan, Today is cycle 4 Taxol Echocardiogram 10/27/2021: EF 60 to 65%  Chemo toxicities: 1. Very mild nausea 2. Fatigue 3. Chemotherapy-induced anemia: Hemoglobin 10.1 stable  She has not had a menstrual cycle this month.  She is very happy about that.  Return to clinic weekly for Taxol and every other week for follow up with me

## 2022-01-19 MED FILL — Dexamethasone Sodium Phosphate Inj 100 MG/10ML: INTRAMUSCULAR | Qty: 1 | Status: AC

## 2022-01-22 ENCOUNTER — Inpatient Hospital Stay: Payer: Medicaid Other | Attending: Hematology and Oncology

## 2022-01-22 ENCOUNTER — Other Ambulatory Visit: Payer: Self-pay

## 2022-01-22 ENCOUNTER — Inpatient Hospital Stay: Payer: Medicaid Other

## 2022-01-22 ENCOUNTER — Encounter: Payer: Self-pay | Admitting: *Deleted

## 2022-01-22 VITALS — BP 128/53 | HR 73 | Temp 98.3°F | Resp 16

## 2022-01-22 DIAGNOSIS — Z87891 Personal history of nicotine dependence: Secondary | ICD-10-CM | POA: Insufficient documentation

## 2022-01-22 DIAGNOSIS — C50412 Malignant neoplasm of upper-outer quadrant of left female breast: Secondary | ICD-10-CM | POA: Insufficient documentation

## 2022-01-22 DIAGNOSIS — Z5111 Encounter for antineoplastic chemotherapy: Secondary | ICD-10-CM | POA: Diagnosis not present

## 2022-01-22 DIAGNOSIS — T451X5A Adverse effect of antineoplastic and immunosuppressive drugs, initial encounter: Secondary | ICD-10-CM | POA: Diagnosis not present

## 2022-01-22 DIAGNOSIS — D6481 Anemia due to antineoplastic chemotherapy: Secondary | ICD-10-CM | POA: Insufficient documentation

## 2022-01-22 DIAGNOSIS — R5383 Other fatigue: Secondary | ICD-10-CM | POA: Diagnosis not present

## 2022-01-22 DIAGNOSIS — Z95828 Presence of other vascular implants and grafts: Secondary | ICD-10-CM

## 2022-01-22 DIAGNOSIS — Z170 Estrogen receptor positive status [ER+]: Secondary | ICD-10-CM | POA: Diagnosis present

## 2022-01-22 LAB — CBC WITH DIFFERENTIAL (CANCER CENTER ONLY)
Abs Immature Granulocytes: 0.01 10*3/uL (ref 0.00–0.07)
Basophils Absolute: 0.1 10*3/uL (ref 0.0–0.1)
Basophils Relative: 1 %
Eosinophils Absolute: 0.1 10*3/uL (ref 0.0–0.5)
Eosinophils Relative: 3 %
HCT: 30.4 % — ABNORMAL LOW (ref 36.0–46.0)
Hemoglobin: 9.4 g/dL — ABNORMAL LOW (ref 12.0–15.0)
Immature Granulocytes: 0 %
Lymphocytes Relative: 23 %
Lymphs Abs: 1.1 10*3/uL (ref 0.7–4.0)
MCH: 27.2 pg (ref 26.0–34.0)
MCHC: 30.9 g/dL (ref 30.0–36.0)
MCV: 88.1 fL (ref 80.0–100.0)
Monocytes Absolute: 0.3 10*3/uL (ref 0.1–1.0)
Monocytes Relative: 7 %
Neutro Abs: 3 10*3/uL (ref 1.7–7.7)
Neutrophils Relative %: 66 %
Platelet Count: 366 10*3/uL (ref 150–400)
RBC: 3.45 MIL/uL — ABNORMAL LOW (ref 3.87–5.11)
RDW: 17.6 % — ABNORMAL HIGH (ref 11.5–15.5)
WBC Count: 4.6 10*3/uL (ref 4.0–10.5)
nRBC: 0 % (ref 0.0–0.2)

## 2022-01-22 LAB — CMP (CANCER CENTER ONLY)
ALT: 14 U/L (ref 0–44)
AST: 15 U/L (ref 15–41)
Albumin: 3.9 g/dL (ref 3.5–5.0)
Alkaline Phosphatase: 73 U/L (ref 38–126)
Anion gap: 6 (ref 5–15)
BUN: 9 mg/dL (ref 6–20)
CO2: 27 mmol/L (ref 22–32)
Calcium: 9.2 mg/dL (ref 8.9–10.3)
Chloride: 105 mmol/L (ref 98–111)
Creatinine: 0.55 mg/dL (ref 0.44–1.00)
GFR, Estimated: >60 mL/min (ref >60)
Glucose, Bld: 85 mg/dL (ref 70–99)
Potassium: 3.8 mmol/L (ref 3.5–5.1)
Sodium: 138 mmol/L (ref 135–145)
Total Bilirubin: 0.5 mg/dL (ref 0.3–1.2)
Total Protein: 7.3 g/dL (ref 6.5–8.1)

## 2022-01-22 MED ORDER — DIPHENHYDRAMINE HCL 50 MG/ML IJ SOLN
25.0000 mg | Freq: Once | INTRAMUSCULAR | Status: AC
  Administered 2022-01-22: 25 mg via INTRAVENOUS
  Filled 2022-01-22: qty 1

## 2022-01-22 MED ORDER — SODIUM CHLORIDE 0.9 % IV SOLN: Freq: Once | INTRAVENOUS | Status: AC

## 2022-01-22 MED ORDER — SODIUM CHLORIDE 0.9% FLUSH
10.0000 mL | INTRAVENOUS | Status: DC | PRN
  Administered 2022-01-22: 10 mL

## 2022-01-22 MED ORDER — SODIUM CHLORIDE 0.9 % IV SOLN
80.0000 mg/m2 | Freq: Once | INTRAVENOUS | Status: AC
  Administered 2022-01-22: 180 mg via INTRAVENOUS
  Filled 2022-01-22: qty 30

## 2022-01-22 MED ORDER — HEPARIN SOD (PORK) LOCK FLUSH 100 UNIT/ML IV SOLN
500.0000 [IU] | Freq: Once | INTRAVENOUS | Status: AC | PRN
  Administered 2022-01-22: 500 [IU]

## 2022-01-22 MED ORDER — SODIUM CHLORIDE 0.9% FLUSH
10.0000 mL | Freq: Once | INTRAVENOUS | Status: AC
  Administered 2022-01-22: 10 mL

## 2022-01-22 MED ORDER — SODIUM CHLORIDE 0.9 % IV SOLN
10.0000 mg | Freq: Once | INTRAVENOUS | Status: AC
  Administered 2022-01-22: 10 mg via INTRAVENOUS
  Filled 2022-01-22: qty 10

## 2022-01-22 MED ORDER — FAMOTIDINE IN NACL 20-0.9 MG/50ML-% IV SOLN
20.0000 mg | Freq: Once | INTRAVENOUS | Status: AC
  Administered 2022-01-22: 20 mg via INTRAVENOUS
  Filled 2022-01-22: qty 50

## 2022-01-22 NOTE — Patient Instructions (Signed)
[facility]   Discharge Instructions: Thank you for choosing [facility] to provide your oncology and hematology care.   If you have a lab appointment with the Cancer Center, please go directly to the Cancer Center and check in at the registration area.   Wear comfortable clothing and clothing appropriate for easy access to any Portacath or PICC line.   We strive to give you quality time with your provider. You may need to reschedule your appointment if you arrive late (15 or more minutes).  Arriving late affects you and other patients whose appointments are after yours.  Also, if you miss three or more appointments without notifying the office, you may be dismissed from the clinic at the provider's discretion.      For prescription refill requests, have your pharmacy contact our office and allow 72 hours for refills to be completed.    Today you received the following chemotherapy and/or immunotherapy agents: paclitaxel.      To help prevent nausea and vomiting after your treatment, we encourage you to take your nausea medication as directed.  BELOW ARE SYMPTOMS THAT SHOULD BE REPORTED IMMEDIATELY: *FEVER GREATER THAN 100.4 F (38 C) OR HIGHER *CHILLS OR SWEATING *NAUSEA AND VOMITING THAT IS NOT CONTROLLED WITH YOUR NAUSEA MEDICATION *UNUSUAL SHORTNESS OF BREATH *UNUSUAL BRUISING OR BLEEDING *URINARY PROBLEMS (pain or burning when urinating, or frequent urination) *BOWEL PROBLEMS (unusual diarrhea, constipation, pain near the anus) TENDERNESS IN MOUTH AND THROAT WITH OR WITHOUT PRESENCE OF ULCERS (sore throat, sores in mouth, or a toothache) UNUSUAL RASH, SWELLING OR PAIN  UNUSUAL VAGINAL DISCHARGE OR ITCHING   Items with * indicate a potential emergency and should be followed up as soon as possible or go to the Emergency Department if any problems should occur.  Please show the CHEMOTHERAPY ALERT CARD or IMMUNOTHERAPY ALERT CARD at check-in  to the Emergency Department and triage nurse.  Should you have questions after your visit or need to cancel or reschedule your appointment, please contact [facility]  Dept: [phone]  and follow the prompts.  Office hours are 8:00 a.m. to 4:30 p.m. Monday - Friday. Please note that voicemails left after 4:00 p.m. may not be returned until the following business day.  We are closed weekends and major holidays. You have access to a nurse at all times for urgent questions. Please call the main number to the clinic Dept: [phone] and follow the prompts.   For any non-urgent questions, you may also contact your provider using MyChart. We now offer e-Visits for anyone 18 and older to request care online for non-urgent symptoms. For details visit [email or web address].   Also download the MyChart app! Go to the app store, search "MyChart", open the app, select [facility], and log in with your MyChart username and password.  Due to Covid, a mask is required upon entering the hospital/clinic. If you do not have a mask, one will be given to you upon arrival. For doctor visits, patients may have 1 support person aged 18 or older with them. For treatment visits, patients cannot have anyone with them due to current Covid guidelines and our immunocompromised population.   

## 2022-01-26 MED FILL — Dexamethasone Sodium Phosphate Inj 100 MG/10ML: INTRAMUSCULAR | Qty: 1 | Status: AC

## 2022-01-26 NOTE — Progress Notes (Signed)
Patient Care Team: Elsie Stain, MD as PCP - General (Pulmonary Disease) Rockwell [place], RN as Oncology Nurse Navigator Mauro Kaufmann, RN as Oncology Nurse Navigator Nicholas Lose, MD as Consulting Physician (Hematology and Oncology) Donnie Mesa, MD as Consulting Physician (General Surgery)  DIAGNOSIS:    ICD-10-CM   1. 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  Variants of uncertain significance detected in POT1 at c.476T>C (p.Met159Thr) and PRKAR1A at c.27T>G (p.Ser9Arg).  The report dates are October 19, 2021 and October 29, 2021, respectively.   The Multi-Cancer + RNA Panel offered by [facility] includes sequencing and/or deletion/duplication analysis of the following 84 genes:  AIP*, ALK, APC*, ATM*, AXIN2*, BAP1*, BARD1*, BLM*, BMPR1A*, BRCA1*, BRCA2*, BRIP1*, CASR, CDC73*, CDH1*, CDK4, CDKN1B*, CDKN1C*, CDKN2A, CEBPA, CHEK2*, CTNNA1*, DICER1*, DIS3L2*,  EGFR, EPCAM, FH*, FLCN*, GATA2*, GPC3, GREM1, HOXB13, HRAS, KIT, MAX*, MEN1*, MET, MITF, MLH1*, MSH2*, MSH3*, MSH6*, MUTYH*, NBN*, NF1*, NF2*, NTHL1*, PALB2*, PDGFRA, PHOX2B, PMS2*, POLD1*, POLE*, POT1*, PRKAR1A*, PTCH1*, PTEN*, RAD50*, RAD51C*, RAD51D*, RB1*, RECQL4, RET, RUNX1*, SDHA*, SDHAF2*, SDHB*, SDHC*, SDHD*, SMAD4*, SMARCA4*, SMARCB1*, SMARCE1*, STK11*, SUFU*, TERC, TERT, TMEM127*, Tp53*, TSC1*, TSC2*, VHL*, WRN*, and WT1.  RNA analysis is performed for * genes.   11/01/2021 -  Chemotherapy   Patient is on Treatment Plan : BREAST ADJUVANT DOSE DENSE AC q14d / PACLitaxel q7d       CHIEF COMPLIANT: Cycle 6 Taxol  INTERVAL HISTORY: Alicia Morton is a 41 y.o. with above-mentioned history of breast cancer currently on chemotherapy with Taxol. She presents to the clinic today for treatment.  Overall she is tolerating the treatment extremely well.  She does have discoloration of the hands and the nails.  Denies any nausea or vomiting.  Energy levels are slowly improving.  She has some irritation of the hair follicles.  ALLERGIES:  has No Known Allergies.  MEDICATIONS:  Current Outpatient Medications  Medication Sig Dispense Refill   albuterol (PROVENTIL) (2.5 MG/3ML) 0.083% nebulizer solution Take 3 mLs (2.5 mg total) by nebulization every 6 (six) hours as needed for wheezing or shortness of breath. 150 mL 1   albuterol (VENTOLIN HFA) 108 (90 Base) MCG/ACT inhaler Inhale 1-2 puffs into the lungs every 6 (six) hours as needed for wheezing or shortness of breath. 18 g 0   cetirizine (ZYRTEC) 10 MG tablet Take 10 mg by mouth daily.     lidocaine-prilocaine (EMLA) cream Apply to affected area once 30 g 3   ondansetron (ZOFRAN) 8  MG tablet Take 1 tablet (8 mg total) by mouth 2 (two) times daily as needed. Start on the third day after chemotherapy. 30 tablet 1   prochlorperazine (COMPAZINE) 10 MG tablet Take 1 tablet (10 mg total) by mouth every 6 (six) hours as needed (Nausea or vomiting). 30  tablet 1   No current facility-administered medications for this visit.    PHYSICAL EXAMINATION: ECOG PERFORMANCE STATUS: 1 - Symptomatic but completely ambulatory  Vitals:   01/29/22 0823  BP: (!) 118/51  Pulse: 87  Resp: 18  Temp: 98.3 F (36.8 C)  SpO2: 100%   Filed Weights   01/29/22 0823  Weight: 238 lb 6.4 oz (108.1 kg)    LABORATORY DATA:  I have reviewed the data as listed CMP Latest Ref Rng & Units 01/22/2022 01/15/2022 01/08/2022  Glucose 70 - 99 mg/dL 85 86 102(H)  BUN 6 - 20 mg/dL _0 Creatinine 0.44 - 1.00 mg/dL 0.55 0.61 0.56  Sodium 135 - 145 mmol/L 138 139 139  Potassium 3.5 - 5.1 mmol/L 3.8 4.0 3.6  Chloride 98 - 111 mmol/L 105 106 107  CO2 22 - 32 mmol/L _1 Calcium 8.9 - 10.3 mg/dL 9.2 9.1 9.1  Total Protein 6.5 - 8.1 g/dL 7.3 7.4 7.3  Total Bilirubin 0.3 - 1.2 mg/dL 0.5 0.4 0.3  Alkaline Phos 38 - 126 U/L 73 69 75  AST 15 - 41 U/L _2 ALT 0 - 44 U/L _3 Lab Results  Component Value Date   WBC 5.6 01/29/2022   HGB 9.9 (L) 01/29/2022   HCT 32.0 (L) 01/29/2022   MCV 88.6 01/29/2022   PLT 406 (H) 01/29/2022   NEUTROABS 4.0 01/29/2022    ASSESSMENT & PLAN:  Malignant neoplasm of upper-outer quadrant of left breast in female, estrogen receptor positive ([place]) 10/04/2021: Palpable left breast mass and left breast swelling for 2 months, mammogram revealed 9 cm abnormality at 2 o'clock position plus another mass at 8:00 measuring 1.3 cm: 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 Bone scan 10/23/2021: No evidence of bony metastases.  Left mandible activity benign   Treatment plan: 1.  Neoadjuvant chemotherapy with dose dense Adriamycin and Cytoxan followed by Taxol 2. modified radical mastectomy 3.   Adjuvant radiation 4.  Adjuvant antiestrogen therapy with ovarian function suppression plus + AI + Verzinio ------------------------------------------------------------------------------------------------------------------------------------------- Current treatment: Completed 4 cycles of dose dense Adriamycin and Cytoxan, Today is cycle 6 Taxol Echocardiogram 10/27/2021: EF 60 to 65%   Chemo toxicities: Fatigue: Slightly better Chemotherapy-induced anemia: Hemoglobin is improving slowly to 9.9 today Denies any peripheral neuropathy. Discoloration of the hands is present.   Return to clinic weekly for Taxol and every other week for follow up with me.    No orders of the defined types were placed in this encounter.  The patient has a good understanding of the overall plan. she agrees with it. she will call with any problems that may develop before the next visit here.  Total time spent: 30 mins including face to face time and time spent for planning, charting and coordination of care  Rulon Eisenmenger, MD, MPH 01/29/2022  I, Thana Ates, am acting as scribe for Dr.  Kwynn Schlotter. ° °I have reviewed the above documentation for accuracy and completeness, and I agree with the above. ° ° ° ° ° ° °

## 2022-01-29 ENCOUNTER — Inpatient Hospital Stay (HOSPITAL_BASED_OUTPATIENT_CLINIC_OR_DEPARTMENT_OTHER): Payer: Medicaid Other | Admitting: Hematology and Oncology

## 2022-01-29 ENCOUNTER — Inpatient Hospital Stay: Payer: Medicaid Other

## 2022-01-29 ENCOUNTER — Other Ambulatory Visit: Payer: Self-pay

## 2022-01-29 DIAGNOSIS — Z5111 Encounter for antineoplastic chemotherapy: Secondary | ICD-10-CM | POA: Diagnosis not present

## 2022-01-29 DIAGNOSIS — C50412 Malignant neoplasm of upper-outer quadrant of left female breast: Secondary | ICD-10-CM

## 2022-01-29 DIAGNOSIS — Z95828 Presence of other vascular implants and grafts: Secondary | ICD-10-CM

## 2022-01-29 DIAGNOSIS — Z170 Estrogen receptor positive status [ER+]: Secondary | ICD-10-CM

## 2022-01-29 LAB — CBC WITH DIFFERENTIAL (CANCER CENTER ONLY)
Abs Immature Granulocytes: 0.02 10*3/uL (ref 0.00–0.07)
Basophils Absolute: 0.1 10*3/uL (ref 0.0–0.1)
Basophils Relative: 1 %
Eosinophils Absolute: 0.2 10*3/uL (ref 0.0–0.5)
Eosinophils Relative: 3 %
HCT: 32 % — ABNORMAL LOW (ref 36.0–46.0)
Hemoglobin: 9.9 g/dL — ABNORMAL LOW (ref 12.0–15.0)
Immature Granulocytes: 0 %
Lymphocytes Relative: 17 %
Lymphs Abs: 0.9 10*3/uL (ref 0.7–4.0)
MCH: 27.4 pg (ref 26.0–34.0)
MCHC: 30.9 g/dL (ref 30.0–36.0)
MCV: 88.6 fL (ref 80.0–100.0)
Monocytes Absolute: 0.4 10*3/uL (ref 0.1–1.0)
Monocytes Relative: 7 %
Neutro Abs: 4 10*3/uL (ref 1.7–7.7)
Neutrophils Relative %: 72 %
Platelet Count: 406 10*3/uL — ABNORMAL HIGH (ref 150–400)
RBC: 3.61 MIL/uL — ABNORMAL LOW (ref 3.87–5.11)
RDW: 17.6 % — ABNORMAL HIGH (ref 11.5–15.5)
WBC Count: 5.6 10*3/uL (ref 4.0–10.5)
nRBC: 0.4 % — ABNORMAL HIGH (ref 0.0–0.2)

## 2022-01-29 LAB — CMP (CANCER CENTER ONLY)
ALT: 14 U/L (ref 0–44)
AST: 21 U/L (ref 15–41)
Albumin: 3.9 g/dL (ref 3.5–5.0)
Alkaline Phosphatase: 78 U/L (ref 38–126)
Anion gap: 6 (ref 5–15)
BUN: 10 mg/dL (ref 6–20)
CO2: 27 mmol/L (ref 22–32)
Calcium: 9.3 mg/dL (ref 8.9–10.3)
Chloride: 106 mmol/L (ref 98–111)
Creatinine: 0.6 mg/dL (ref 0.44–1.00)
GFR, Estimated: >60 mL/min (ref >60)
Glucose, Bld: 90 mg/dL (ref 70–99)
Potassium: 4 mmol/L (ref 3.5–5.1)
Sodium: 139 mmol/L (ref 135–145)
Total Bilirubin: 0.5 mg/dL (ref 0.3–1.2)
Total Protein: 7.4 g/dL (ref 6.5–8.1)

## 2022-01-29 MED ORDER — HEPARIN SOD (PORK) LOCK FLUSH 100 UNIT/ML IV SOLN
500.0000 [IU] | Freq: Once | INTRAVENOUS | Status: AC | PRN
  Administered 2022-01-29: 500 [IU]

## 2022-01-29 MED ORDER — SODIUM CHLORIDE 0.9 % IV SOLN
10.0000 mg | Freq: Once | INTRAVENOUS | Status: AC
  Administered 2022-01-29: 10 mg via INTRAVENOUS
  Filled 2022-01-29: qty 10

## 2022-01-29 MED ORDER — SODIUM CHLORIDE 0.9% FLUSH
10.0000 mL | INTRAVENOUS | Status: DC | PRN
  Administered 2022-01-29: 10 mL

## 2022-01-29 MED ORDER — SODIUM CHLORIDE 0.9 % IV SOLN
80.0000 mg/m2 | Freq: Once | INTRAVENOUS | Status: AC
  Administered 2022-01-29: 180 mg via INTRAVENOUS
  Filled 2022-01-29: qty 30

## 2022-01-29 MED ORDER — SODIUM CHLORIDE 0.9% FLUSH
10.0000 mL | Freq: Once | INTRAVENOUS | Status: AC
  Administered 2022-01-29: 10 mL

## 2022-01-29 MED ORDER — SODIUM CHLORIDE 0.9 % IV SOLN: Freq: Once | INTRAVENOUS | Status: AC

## 2022-01-29 MED ORDER — FAMOTIDINE IN NACL 20-0.9 MG/50ML-% IV SOLN
20.0000 mg | Freq: Once | INTRAVENOUS | Status: AC
  Administered 2022-01-29: 20 mg via INTRAVENOUS
  Filled 2022-01-29: qty 50

## 2022-01-29 MED ORDER — DIPHENHYDRAMINE HCL 50 MG/ML IJ SOLN
25.0000 mg | Freq: Once | INTRAMUSCULAR | Status: AC
  Administered 2022-01-29: 25 mg via INTRAVENOUS
  Filled 2022-01-29: qty 1

## 2022-01-29 NOTE — Patient Instructions (Signed)
[facility]   Discharge Instructions: Thank you for choosing [facility] to provide your oncology and hematology care.   If you have a lab appointment with the Cancer Center, please go directly to the Cancer Center and check in at the registration area.   Wear comfortable clothing and clothing appropriate for easy access to any Portacath or PICC line.   We strive to give you quality time with your provider. You may need to reschedule your appointment if you arrive late (15 or more minutes).  Arriving late affects you and other patients whose appointments are after yours.  Also, if you miss three or more appointments without notifying the office, you may be dismissed from the clinic at the provider's discretion.      For prescription refill requests, have your pharmacy contact our office and allow 72 hours for refills to be completed.    Today you received the following chemotherapy and/or immunotherapy agents: paclitaxel.      To help prevent nausea and vomiting after your treatment, we encourage you to take your nausea medication as directed.  BELOW ARE SYMPTOMS THAT SHOULD BE REPORTED IMMEDIATELY: *FEVER GREATER THAN 100.4 F (38 C) OR HIGHER *CHILLS OR SWEATING *NAUSEA AND VOMITING THAT IS NOT CONTROLLED WITH YOUR NAUSEA MEDICATION *UNUSUAL SHORTNESS OF BREATH *UNUSUAL BRUISING OR BLEEDING *URINARY PROBLEMS (pain or burning when urinating, or frequent urination) *BOWEL PROBLEMS (unusual diarrhea, constipation, pain near the anus) TENDERNESS IN MOUTH AND THROAT WITH OR WITHOUT PRESENCE OF ULCERS (sore throat, sores in mouth, or a toothache) UNUSUAL RASH, SWELLING OR PAIN  UNUSUAL VAGINAL DISCHARGE OR ITCHING   Items with * indicate a potential emergency and should be followed up as soon as possible or go to the Emergency Department if any problems should occur.  Please show the CHEMOTHERAPY ALERT CARD or IMMUNOTHERAPY ALERT CARD at check-in  to the Emergency Department and triage nurse.  Should you have questions after your visit or need to cancel or reschedule your appointment, please contact [facility]  Dept: [phone]  and follow the prompts.  Office hours are 8:00 a.m. to 4:30 p.m. Monday - Friday. Please note that voicemails left after 4:00 p.m. may not be returned until the following business day.  We are closed weekends and major holidays. You have access to a nurse at all times for urgent questions. Please call the main number to the clinic Dept: [phone] and follow the prompts.   For any non-urgent questions, you may also contact your provider using MyChart. We now offer e-Visits for anyone 18 and older to request care online for non-urgent symptoms. For details visit [email or web address].   Also download the MyChart app! Go to the app store, search "MyChart", open the app, select Alicia Morton, and log in with your MyChart username and password.  Due to Covid, a mask is required upon entering the hospital/clinic. If you do not have a mask, one will be given to you upon arrival. For doctor visits, patients may have 1 support person aged 18 or older with them. For treatment visits, patients cannot have anyone with them due to current Covid guidelines and our immunocompromised population.   

## 2022-01-29 NOTE — Assessment & Plan Note (Signed)
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 Bone scan 10/23/2021: No evidence of bony metastases. Left mandible activity benign  Treatment plan: 1.Neoadjuvant chemotherapy with dose dense Adriamycin and Cytoxan followed by Taxol 2.modified radical mastectomy 3.Adjuvant radiation 4.Adjuvant antiestrogen therapy with ovarian function suppression plus + AI + Verzinio ------------------------------------------------------------------------------------------------------------------------------------------- Current treatment:Completed 4 cycles ofdose dense Adriamycin and Cytoxan, Today is cycle 6 Taxol Echocardiogram 10/27/2021: EF 60 to 65%  Chemo toxicities: 1. Fatigue 2. Chemotherapy-induced anemia: Hemoglobin is improving slowly to 9.4 today Denies any peripheral neuropathy. Discoloration of the hands is present.  Return to clinicweekly for Taxol and every other week for follow up with me

## 2022-02-01 ENCOUNTER — Telehealth: Payer: Self-pay | Admitting: Hematology and Oncology

## 2022-02-01 NOTE — Telephone Encounter (Signed)
Rescheduled appointments per [facility]. Patient is aware of new appointment times. Patient does prefer 9:30 appointments for the future.

## 2022-02-02 MED FILL — Dexamethasone Sodium Phosphate Inj 100 MG/10ML: INTRAMUSCULAR | Qty: 1 | Status: AC

## 2022-02-05 ENCOUNTER — Other Ambulatory Visit: Payer: Self-pay

## 2022-02-05 ENCOUNTER — Inpatient Hospital Stay: Payer: Medicaid Other

## 2022-02-05 VITALS — BP 120/61 | HR 76 | Temp 98.5°F | Resp 18 | Wt 240.5 lb

## 2022-02-05 DIAGNOSIS — C50412 Malignant neoplasm of upper-outer quadrant of left female breast: Secondary | ICD-10-CM

## 2022-02-05 DIAGNOSIS — Z170 Estrogen receptor positive status [ER+]: Secondary | ICD-10-CM

## 2022-02-05 DIAGNOSIS — Z95828 Presence of other vascular implants and grafts: Secondary | ICD-10-CM

## 2022-02-05 DIAGNOSIS — Z5111 Encounter for antineoplastic chemotherapy: Secondary | ICD-10-CM | POA: Diagnosis not present

## 2022-02-05 LAB — CBC WITH DIFFERENTIAL (CANCER CENTER ONLY)
Abs Immature Granulocytes: 0.01 10*3/uL (ref 0.00–0.07)
Basophils Absolute: 0.1 10*3/uL (ref 0.0–0.1)
Basophils Relative: 1 %
Eosinophils Absolute: 0.1 10*3/uL (ref 0.0–0.5)
Eosinophils Relative: 3 %
HCT: 31.5 % — ABNORMAL LOW (ref 36.0–46.0)
Hemoglobin: 9.8 g/dL — ABNORMAL LOW (ref 12.0–15.0)
Immature Granulocytes: 0 %
Lymphocytes Relative: 24 %
Lymphs Abs: 1.3 10*3/uL (ref 0.7–4.0)
MCH: 27.8 pg (ref 26.0–34.0)
MCHC: 31.1 g/dL (ref 30.0–36.0)
MCV: 89.2 fL (ref 80.0–100.0)
Monocytes Absolute: 0.3 10*3/uL (ref 0.1–1.0)
Monocytes Relative: 6 %
Neutro Abs: 3.5 10*3/uL (ref 1.7–7.7)
Neutrophils Relative %: 66 %
Platelet Count: 415 10*3/uL — ABNORMAL HIGH (ref 150–400)
RBC: 3.53 MIL/uL — ABNORMAL LOW (ref 3.87–5.11)
RDW: 17.5 % — ABNORMAL HIGH (ref 11.5–15.5)
WBC Count: 5.2 10*3/uL (ref 4.0–10.5)
nRBC: 0 % (ref 0.0–0.2)

## 2022-02-05 LAB — CMP (CANCER CENTER ONLY)
ALT: 14 U/L (ref 0–44)
AST: 15 U/L (ref 15–41)
Albumin: 3.9 g/dL (ref 3.5–5.0)
Alkaline Phosphatase: 82 U/L (ref 38–126)
Anion gap: 5 (ref 5–15)
BUN: 12 mg/dL (ref 6–20)
CO2: 28 mmol/L (ref 22–32)
Calcium: 9.1 mg/dL (ref 8.9–10.3)
Chloride: 105 mmol/L (ref 98–111)
Creatinine: 0.58 mg/dL (ref 0.44–1.00)
GFR, Estimated: >60 mL/min (ref >60)
Glucose, Bld: 103 mg/dL — ABNORMAL HIGH (ref 70–99)
Potassium: 3.9 mmol/L (ref 3.5–5.1)
Sodium: 138 mmol/L (ref 135–145)
Total Bilirubin: 0.4 mg/dL (ref 0.3–1.2)
Total Protein: 7.5 g/dL (ref 6.5–8.1)

## 2022-02-05 MED ORDER — SODIUM CHLORIDE 0.9 % IV SOLN
80.0000 mg/m2 | Freq: Once | INTRAVENOUS | Status: AC
  Administered 2022-02-05: 180 mg via INTRAVENOUS
  Filled 2022-02-05: qty 30

## 2022-02-05 MED ORDER — SODIUM CHLORIDE 0.9 % IV SOLN
10.0000 mg | Freq: Once | INTRAVENOUS | Status: AC
  Administered 2022-02-05: 10 mg via INTRAVENOUS
  Filled 2022-02-05: qty 10

## 2022-02-05 MED ORDER — FAMOTIDINE IN NACL 20-0.9 MG/50ML-% IV SOLN
20.0000 mg | Freq: Once | INTRAVENOUS | Status: AC
  Administered 2022-02-05: 20 mg via INTRAVENOUS
  Filled 2022-02-05: qty 50

## 2022-02-05 MED ORDER — SODIUM CHLORIDE 0.9% FLUSH
10.0000 mL | Freq: Once | INTRAVENOUS | Status: AC
  Administered 2022-02-05: 10 mL

## 2022-02-05 MED ORDER — DIPHENHYDRAMINE HCL 50 MG/ML IJ SOLN
25.0000 mg | Freq: Once | INTRAMUSCULAR | Status: AC
  Administered 2022-02-05: 25 mg via INTRAVENOUS
  Filled 2022-02-05: qty 1

## 2022-02-05 MED ORDER — SODIUM CHLORIDE 0.9% FLUSH
10.0000 mL | INTRAVENOUS | Status: DC | PRN
  Administered 2022-02-05: 10 mL

## 2022-02-05 MED ORDER — HEPARIN SOD (PORK) LOCK FLUSH 100 UNIT/ML IV SOLN
500.0000 [IU] | Freq: Once | INTRAVENOUS | Status: AC | PRN
  Administered 2022-02-05: 500 [IU]

## 2022-02-05 MED ORDER — SODIUM CHLORIDE 0.9 % IV SOLN: Freq: Once | INTRAVENOUS | Status: AC

## 2022-02-09 MED FILL — Dexamethasone Sodium Phosphate Inj 100 MG/10ML: INTRAMUSCULAR | Qty: 1 | Status: AC

## 2022-02-12 ENCOUNTER — Other Ambulatory Visit: Payer: Self-pay

## 2022-02-12 ENCOUNTER — Inpatient Hospital Stay: Payer: Medicaid Other

## 2022-02-12 ENCOUNTER — Inpatient Hospital Stay (HOSPITAL_BASED_OUTPATIENT_CLINIC_OR_DEPARTMENT_OTHER): Payer: Medicaid Other | Admitting: Adult Health

## 2022-02-12 DIAGNOSIS — C50412 Malignant neoplasm of upper-outer quadrant of left female breast: Secondary | ICD-10-CM | POA: Diagnosis not present

## 2022-02-12 DIAGNOSIS — Z170 Estrogen receptor positive status [ER+]: Secondary | ICD-10-CM

## 2022-02-12 DIAGNOSIS — Z5111 Encounter for antineoplastic chemotherapy: Secondary | ICD-10-CM | POA: Diagnosis not present

## 2022-02-12 LAB — CBC WITH DIFFERENTIAL (CANCER CENTER ONLY)
Abs Immature Granulocytes: 0.02 10*3/uL (ref 0.00–0.07)
Basophils Absolute: 0.1 10*3/uL (ref 0.0–0.1)
Basophils Relative: 1 %
Eosinophils Absolute: 0.1 10*3/uL (ref 0.0–0.5)
Eosinophils Relative: 3 %
HCT: 32.1 % — ABNORMAL LOW (ref 36.0–46.0)
Hemoglobin: 10 g/dL — ABNORMAL LOW (ref 12.0–15.0)
Immature Granulocytes: 0 %
Lymphocytes Relative: 21 %
Lymphs Abs: 1.1 10*3/uL (ref 0.7–4.0)
MCH: 28 pg (ref 26.0–34.0)
MCHC: 31.2 g/dL (ref 30.0–36.0)
MCV: 89.9 fL (ref 80.0–100.0)
Monocytes Absolute: 0.4 10*3/uL (ref 0.1–1.0)
Monocytes Relative: 8 %
Neutro Abs: 3.5 10*3/uL (ref 1.7–7.7)
Neutrophils Relative %: 67 %
Platelet Count: 423 10*3/uL — ABNORMAL HIGH (ref 150–400)
RBC: 3.57 MIL/uL — ABNORMAL LOW (ref 3.87–5.11)
RDW: 17.4 % — ABNORMAL HIGH (ref 11.5–15.5)
WBC Count: 5.2 10*3/uL (ref 4.0–10.5)
nRBC: 0.4 % — ABNORMAL HIGH (ref 0.0–0.2)

## 2022-02-12 LAB — CMP (CANCER CENTER ONLY)
ALT: 14 U/L (ref 0–44)
AST: 17 U/L (ref 15–41)
Albumin: 4 g/dL (ref 3.5–5.0)
Alkaline Phosphatase: 78 U/L (ref 38–126)
Anion gap: 5 (ref 5–15)
BUN: 10 mg/dL (ref 6–20)
CO2: 27 mmol/L (ref 22–32)
Calcium: 9.4 mg/dL (ref 8.9–10.3)
Chloride: 104 mmol/L (ref 98–111)
Creatinine: 0.66 mg/dL (ref 0.44–1.00)
GFR, Estimated: >60 mL/min (ref >60)
Glucose, Bld: 91 mg/dL (ref 70–99)
Potassium: 4.1 mmol/L (ref 3.5–5.1)
Sodium: 136 mmol/L (ref 135–145)
Total Bilirubin: 0.5 mg/dL (ref 0.3–1.2)
Total Protein: 7.7 g/dL (ref 6.5–8.1)

## 2022-02-12 MED ORDER — SODIUM CHLORIDE 0.9% FLUSH: 10.0000 mL | Freq: Once | INTRAVENOUS | Status: DC

## 2022-02-12 MED ORDER — DIPHENHYDRAMINE HCL 50 MG/ML IJ SOLN
25.0000 mg | Freq: Once | INTRAMUSCULAR | Status: AC
  Administered 2022-02-12: 25 mg via INTRAVENOUS
  Filled 2022-02-12: qty 1

## 2022-02-12 MED ORDER — SODIUM CHLORIDE 0.9% FLUSH
10.0000 mL | INTRAVENOUS | Status: DC | PRN
  Administered 2022-02-12: 10 mL

## 2022-02-12 MED ORDER — SODIUM CHLORIDE 0.9 % IV SOLN
80.0000 mg/m2 | Freq: Once | INTRAVENOUS | Status: AC
  Administered 2022-02-12: 180 mg via INTRAVENOUS
  Filled 2022-02-12: qty 30

## 2022-02-12 MED ORDER — FAMOTIDINE IN NACL 20-0.9 MG/50ML-% IV SOLN
20.0000 mg | Freq: Once | INTRAVENOUS | Status: AC
  Administered 2022-02-12: 20 mg via INTRAVENOUS
  Filled 2022-02-12: qty 50

## 2022-02-12 MED ORDER — SODIUM CHLORIDE 0.9 % IV SOLN
10.0000 mg | Freq: Once | INTRAVENOUS | Status: AC
  Administered 2022-02-12: 10 mg via INTRAVENOUS
  Filled 2022-02-12: qty 10

## 2022-02-12 MED ORDER — SODIUM CHLORIDE 0.9 % IV SOLN: Freq: Once | INTRAVENOUS | Status: AC

## 2022-02-12 MED ORDER — HEPARIN SOD (PORK) LOCK FLUSH 100 UNIT/ML IV SOLN
500.0000 [IU] | Freq: Once | INTRAVENOUS | Status: AC | PRN
  Administered 2022-02-12: 500 [IU]

## 2022-02-12 NOTE — Assessment & Plan Note (Signed)
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 Bone scan 10/23/2021: No evidence of bony metastases. Left mandible activity benign  Treatment plan: 1.Neoadjuvant chemotherapy with dose dense Adriamycin and Cytoxan followed by Taxol 2.modified radical mastectomy 3.Adjuvant radiation 4.Adjuvant antiestrogen therapy with ovarian function suppression plus + AI + Verzinio ------------------------------------------------------------------------------------------------------------------------------------------- Current treatment:Completed 4 cycles ofdose dense Adriamycin and Cytoxan, Today is cycle 8 Taxol Echocardiogram 10/27/2021: EF 60 to 65%  Chemo toxicities: 1. Fatigue 2.  Labs are pending at the completion of today's visit  We discussed neuropathy and what to monitor moving forward.  She knows to let us know whether she develops it again, and will make an appointment if it is in her off week.    Return to clinicweekly for Taxol and every other week for follow up with me
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
## 2022-02-12 NOTE — Patient Instructions (Signed)
[facility] MEDICAL ONCOLOGY   Discharge Instructions: Thank you for choosing [facility] to provide your oncology and hematology care.   If you have a lab appointment with the Cancer Center, please go directly to the Cancer Center and check in at the registration area.   Wear comfortable clothing and clothing appropriate for easy access to any Portacath or PICC line.   We strive to give you quality time with your provider. You may need to reschedule your appointment if you arrive late (15 or more minutes).  Arriving late affects you and other patients whose appointments are after yours.  Also, if you miss three or more appointments without notifying the office, you may be dismissed from the clinic at the provider's discretion.      For prescription refill requests, have your pharmacy contact our office and allow 72 hours for refills to be completed.    Today you received the following chemotherapy and/or immunotherapy agents: paclitaxel.      To help prevent nausea and vomiting after your treatment, we encourage you to take your nausea medication as directed.  BELOW ARE SYMPTOMS THAT SHOULD BE REPORTED IMMEDIATELY: *FEVER GREATER THAN 100.4 F (38 C) OR HIGHER *CHILLS OR SWEATING *NAUSEA AND VOMITING THAT IS NOT CONTROLLED WITH YOUR NAUSEA MEDICATION *UNUSUAL SHORTNESS OF BREATH *UNUSUAL BRUISING OR BLEEDING *URINARY PROBLEMS (pain or burning when urinating, or frequent urination) *BOWEL PROBLEMS (unusual diarrhea, constipation, pain near the anus) TENDERNESS IN MOUTH AND THROAT WITH OR WITHOUT PRESENCE OF ULCERS (sore throat, sores in mouth, or a toothache) UNUSUAL RASH, SWELLING OR PAIN  UNUSUAL VAGINAL DISCHARGE OR ITCHING   Items with * indicate a potential emergency and should be followed up as soon as possible or go to the Emergency Department if any problems should occur.  Please show the CHEMOTHERAPY ALERT CARD or IMMUNOTHERAPY ALERT CARD at check-in  to the Emergency Department and triage nurse.  Should you have questions after your visit or need to cancel or reschedule your appointment, please contact [facility] MEDICAL ONCOLOG[facility]  Dept: [phone]  and follow the prompts.  Office hours are 8:00 a.m. to 4:30 p.m. Monday - Friday. Please note that voicemails left after 4:00 p.m. may not be returned until the following business day.  We are closed weekends and major holidays. You have access to a nurse at all times for urgent questions. Please call the main number to the clinic Dept: [phone] and follow the prompts.   For any non-urgent questions, you may also contact your provider using MyChart. We now offer e-Visits for anyone 18 and older to request care online for non-urgent symptoms. For details visit [email or web address].   Also download the MyChart app! Go to the app store, search "MyChart", open the app, select [facility], and log in with your MyChart username and password.  Due to Covid, a mask is required upon entering the hospital/clinic. If you do not have a mask, one will be given to you upon arrival. For doctor visits, patients may have 1 support person aged 18 or older with them. For treatment visits, patients cannot have anyone with them due to current Covid guidelines and our immunocompromised population.   

## 2022-02-16 MED FILL — Dexamethasone Sodium Phosphate Inj 100 MG/10ML: INTRAMUSCULAR | Qty: 1 | Status: AC

## 2022-02-19 ENCOUNTER — Inpatient Hospital Stay: Payer: Medicaid Other

## 2022-02-19 ENCOUNTER — Other Ambulatory Visit: Payer: Self-pay

## 2022-02-19 ENCOUNTER — Inpatient Hospital Stay: Payer: Medicaid Other | Attending: Hematology and Oncology

## 2022-02-19 VITALS — BP 123/60 | HR 79 | Temp 98.2°F | Resp 16 | Wt 241.5 lb

## 2022-02-19 DIAGNOSIS — Z5111 Encounter for antineoplastic chemotherapy: Secondary | ICD-10-CM | POA: Diagnosis not present

## 2022-02-19 DIAGNOSIS — Z170 Estrogen receptor positive status [ER+]: Secondary | ICD-10-CM

## 2022-02-19 DIAGNOSIS — C50412 Malignant neoplasm of upper-outer quadrant of left female breast: Secondary | ICD-10-CM | POA: Diagnosis present

## 2022-02-19 DIAGNOSIS — Z95828 Presence of other vascular implants and grafts: Secondary | ICD-10-CM

## 2022-02-19 DIAGNOSIS — Z87891 Personal history of nicotine dependence: Secondary | ICD-10-CM | POA: Diagnosis not present

## 2022-02-19 DIAGNOSIS — R5383 Other fatigue: Secondary | ICD-10-CM | POA: Insufficient documentation

## 2022-02-19 LAB — CMP (CANCER CENTER ONLY)
ALT: 13 U/L (ref 0–44)
AST: 16 U/L (ref 15–41)
Albumin: 3.8 g/dL (ref 3.5–5.0)
Alkaline Phosphatase: 71 U/L (ref 38–126)
Anion gap: 6 (ref 5–15)
BUN: 14 mg/dL (ref 6–20)
CO2: 27 mmol/L (ref 22–32)
Calcium: 9.2 mg/dL (ref 8.9–10.3)
Chloride: 106 mmol/L (ref 98–111)
Creatinine: 0.61 mg/dL (ref 0.44–1.00)
GFR, Estimated: >60 mL/min (ref >60)
Glucose, Bld: 93 mg/dL (ref 70–99)
Potassium: 3.9 mmol/L (ref 3.5–5.1)
Sodium: 139 mmol/L (ref 135–145)
Total Bilirubin: 0.4 mg/dL (ref 0.3–1.2)
Total Protein: 7.3 g/dL (ref 6.5–8.1)

## 2022-02-19 LAB — CBC WITH DIFFERENTIAL (CANCER CENTER ONLY)
Abs Immature Granulocytes: 0.01 10*3/uL (ref 0.00–0.07)
Basophils Absolute: 0.1 10*3/uL (ref 0.0–0.1)
Basophils Relative: 1 %
Eosinophils Absolute: 0.1 10*3/uL (ref 0.0–0.5)
Eosinophils Relative: 2 %
HCT: 29.9 % — ABNORMAL LOW (ref 36.0–46.0)
Hemoglobin: 9.5 g/dL — ABNORMAL LOW (ref 12.0–15.0)
Immature Granulocytes: 0 %
Lymphocytes Relative: 24 %
Lymphs Abs: 1.3 10*3/uL (ref 0.7–4.0)
MCH: 28 pg (ref 26.0–34.0)
MCHC: 31.8 g/dL (ref 30.0–36.0)
MCV: 88.2 fL (ref 80.0–100.0)
Monocytes Absolute: 0.4 10*3/uL (ref 0.1–1.0)
Monocytes Relative: 8 %
Neutro Abs: 3.4 10*3/uL (ref 1.7–7.7)
Neutrophils Relative %: 65 %
Platelet Count: 413 10*3/uL — ABNORMAL HIGH (ref 150–400)
RBC: 3.39 MIL/uL — ABNORMAL LOW (ref 3.87–5.11)
RDW: 17.2 % — ABNORMAL HIGH (ref 11.5–15.5)
WBC Count: 5.2 10*3/uL (ref 4.0–10.5)
nRBC: 0.4 % — ABNORMAL HIGH (ref 0.0–0.2)

## 2022-02-19 MED ORDER — DIPHENHYDRAMINE HCL 50 MG/ML IJ SOLN
25.0000 mg | Freq: Once | INTRAMUSCULAR | Status: AC
  Administered 2022-02-19: 25 mg via INTRAVENOUS
  Filled 2022-02-19: qty 1

## 2022-02-19 MED ORDER — HEPARIN SOD (PORK) LOCK FLUSH 100 UNIT/ML IV SOLN
500.0000 [IU] | Freq: Once | INTRAVENOUS | Status: AC | PRN
  Administered 2022-02-19: 500 [IU]

## 2022-02-19 MED ORDER — SODIUM CHLORIDE 0.9% FLUSH
10.0000 mL | Freq: Once | INTRAVENOUS | Status: AC
  Administered 2022-02-19: 10 mL

## 2022-02-19 MED ORDER — SODIUM CHLORIDE 0.9 % IV SOLN
80.0000 mg/m2 | Freq: Once | INTRAVENOUS | Status: AC
  Administered 2022-02-19: 180 mg via INTRAVENOUS
  Filled 2022-02-19: qty 30

## 2022-02-19 MED ORDER — SODIUM CHLORIDE 0.9 % IV SOLN: Freq: Once | INTRAVENOUS | Status: AC

## 2022-02-19 MED ORDER — FAMOTIDINE IN NACL 20-0.9 MG/50ML-% IV SOLN
20.0000 mg | Freq: Once | INTRAVENOUS | Status: AC
  Administered 2022-02-19: 20 mg via INTRAVENOUS
  Filled 2022-02-19: qty 50

## 2022-02-19 MED ORDER — SODIUM CHLORIDE 0.9 % IV SOLN
10.0000 mg | Freq: Once | INTRAVENOUS | Status: AC
  Administered 2022-02-19: 10 mg via INTRAVENOUS
  Filled 2022-02-19: qty 10

## 2022-02-19 MED ORDER — SODIUM CHLORIDE 0.9% FLUSH
10.0000 mL | INTRAVENOUS | Status: DC | PRN
  Administered 2022-02-19: 10 mL

## 2022-02-19 NOTE — Patient Instructions (Signed)
[facility]  Discharge Instructions: ?Thank you for choosing [facility] to provide your oncology and hematology care.  ? ?If you have a lab appointment with the [facility], please go directly to the [facility] and check in at the registration area. ?  ?Wear comfortable clothing and clothing appropriate for easy access to any Portacath or PICC line.  ? ?We strive to give you quality time with your provider. You may need to reschedule your appointment if you arrive late (15 or more minutes).  Arriving late affects you and other patients whose appointments are after yours.  Also, if you miss three or more appointments without notifying the office, you may be dismissed from the clinic at the provider?s discretion.    ?  ?For prescription refill requests, have your pharmacy contact our office and allow 72 hours for refills to be completed.   ? ?Today you received the following chemotherapy and/or immunotherapy agents: Taxol    ?  ?To help prevent nausea and vomiting after your treatment, we encourage you to take your nausea medication as directed. ? ?BELOW ARE SYMPTOMS THAT SHOULD BE REPORTED IMMEDIATELY: ?*FEVER GREATER THAN 100.4 F (38 ?C) OR HIGHER ?*CHILLS OR SWEATING ?*NAUSEA AND VOMITING THAT IS NOT CONTROLLED WITH YOUR NAUSEA MEDICATION ?*UNUSUAL SHORTNESS OF BREATH ?*UNUSUAL BRUISING OR BLEEDING ?*URINARY PROBLEMS (pain or burning when urinating, or frequent urination) ?*BOWEL PROBLEMS (unusual diarrhea, constipation, pain near the anus) ?TENDERNESS IN MOUTH AND THROAT WITH OR WITHOUT PRESENCE OF ULCERS (sore throat, sores in mouth, or a toothache) ?UNUSUAL RASH, SWELLING OR PAIN  ?UNUSUAL VAGINAL DISCHARGE OR ITCHING  ? ?Items with * indicate a potential emergency and should be followed up as soon as possible or go to the Emergency Department if any problems should occur. ? ?Please show the CHEMOTHERAPY ALERT CARD or IMMUNOTHERAPY ALERT CARD at check-in to the  Emergency Department and triage nurse. ? ?Should you have questions after your visit or need to cancel or reschedule your appointment, please contact [facility]  Dept: [phone]  and follow the prompts.  Office hours are 8:00 a.m. to 4:30 p.m. Monday - Friday. Please note that voicemails left after 4:00 p.m. may not be returned until the following business day.  We are closed weekends and major holidays. You have access to a nurse at all times for urgent questions. Please call the main number to the clinic Dept: [phone] and follow the prompts. ? ? ?For any non-urgent questions, you may also contact your provider using MyChart. We now offer e-Visits for anyone 15 and older to request care online for non-urgent symptoms. For details visit [email or web address]. ?  ?Also download the MyChart app! Go to the app store, search "MyChart", open the app, select [facility], and log in with your MyChart username and password. ? ?Due to Covid, a mask is required upon entering the hospital/clinic. If you do not have a mask, one will be given to you upon arrival. For doctor visits, patients may have 1 support person aged 28 or older with them. For treatment visits, patients cannot have anyone with them due to current Covid guidelines and our immunocompromised population.  ? ?

## 2022-02-22 NOTE — Progress Notes (Signed)
Patient Care Team: Elsie Stain, MD as PCP - General (Pulmonary Disease) Rockwell Germany, RN as Oncology Nurse Navigator Mauro Kaufmann, RN as Oncology Nurse Navigator Nicholas Lose, MD as Consulting Physician (Hematology and Oncology) Donnie Mesa, MD as Consulting Physician (General Surgery)  DIAGNOSIS:  Encounter Diagnosis  Name Primary?   Malignant neoplasm of upper-outer quadrant of left breast in female, estrogen receptor positive ([place]) Yes    SUMMARY OF ONCOLOGIC HISTORY: Oncology History  Malignant neoplasm of upper-outer quadrant of left breast in female, estrogen receptor positive ([place])  10/04/2021 Initial Diagnosis   Palpable left breast mass and left breast swelling for 2 months, mammogram revealed 9 cm abnormality at 2 o'clock position plus another mass at 8:00 measuring 1.3 cm: Biopsy of both revealed grade 3 IDC ER 95%, PR 0%, HER2 negative, Ki-67 40%, 5 abnormal lymph nodes: Biopsy of 1 was positive (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  Variants of uncertain significance detected in POT1 at c.476T>C (p.Met159Thr) and PRKAR1A at c.27T>G (p.Ser9Arg).  The report dates are October 19, 2021 and October 29, 2021, respectively.   The Multi-Cancer + RNA Panel offered by [facility] includes sequencing and/or deletion/duplication analysis of the following 84 genes:  AIP*, ALK, APC*, ATM*, AXIN2*, BAP1*, BARD1*, BLM*, BMPR1A*, BRCA1*, BRCA2*, BRIP1*, CASR, CDC73*, CDH1*, CDK4, CDKN1B*, CDKN1C*, CDKN2A, CEBPA, CHEK2*, CTNNA1*, DICER1*, DIS3L2*,  EGFR, EPCAM, FH*, FLCN*, GATA2*, GPC3, GREM1, HOXB13, HRAS, KIT, MAX*, MEN1*, MET, MITF, MLH1*, MSH2*, MSH3*, MSH6*, MUTYH*, NBN*, NF1*, NF2*, NTHL1*, PALB2*, PDGFRA, PHOX2B, PMS2*, POLD1*, POLE*, POT1*, PRKAR1A*, PTCH1*, PTEN*, RAD50*, RAD51C*, RAD51D*, RB1*, RECQL4, RET, RUNX1*, SDHA*, SDHAF2*, SDHB*, SDHC*, SDHD*, SMAD4*, SMARCA4*, SMARCB1*, SMARCE1*, STK11*, SUFU*, TERC, TERT, TMEM127*, Tp53*, TSC1*, TSC2*, VHL*, WRN*, and WT1.  RNA analysis is performed for * genes.   11/01/2021 -  Chemotherapy   Patient is on Treatment Plan : BREAST ADJUVANT DOSE DENSE AC q14d / PACLitaxel q7d       CHIEF COMPLIANT: Cycle 10 Taxol  INTERVAL HISTORY: Alicia Morton is a 41 y.o. with above-mentioned history of breast cancer currently on chemotherapy with Taxol. She presents to the clinic today for treatment. She states that her fingers was tingling last week. Not in her feet. She also states that her appetite was really good. She states that she doesn't want any reconstruction. She noticed mild numbness of the tips of the fingers but it has not persisted.  Denies any neuropathy in the feet.  ALLERGIES:  has No Known Allergies.  MEDICATIONS:  Current Outpatient Medications  Medication Sig Dispense Refill   albuterol (PROVENTIL) (2.5 MG/3ML) 0.083% nebulizer solution Take 3 mLs (2.5 mg total) by nebulization every 6 (six) hours as needed for wheezing or shortness of breath. 150 mL 1   albuterol (VENTOLIN HFA) 108 (90 Base) MCG/ACT inhaler Inhale 1-2 puffs into the lungs every 6 (six) hours as needed for wheezing or shortness of breath. 18 g 0   cetirizine (ZYRTEC) 10 MG tablet Take 10 mg by mouth daily.     lidocaine-prilocaine (EMLA) cream Apply to affected area once 30 g 3  ondansetron (ZOFRAN) 8 MG tablet Take 1 tablet (8 mg total) by mouth 2 (two) times daily as needed. Start on the third day after chemotherapy. 30 tablet 1   prochlorperazine (COMPAZINE) 10 MG tablet Take 1 tablet (10 mg total) by  mouth every 6 (six) hours as needed (Nausea or vomiting). 30 tablet 1   No current facility-administered medications for this visit.   Facility-Administered Medications Ordered in Other Visits  Medication Dose Route Frequency Provider Last Rate Last Admin   sodium chloride flush (NS) 0.9 % injection 10 mL  10 mL Intracatheter Once Nicholas Lose, MD        PHYSICAL EXAMINATION: ECOG PERFORMANCE STATUS: 1 - Symptomatic but completely ambulatory  Vitals:   02/26/22 0949  BP: (!) 116/43  Pulse: 80  Resp: 18  Temp: 97.9 F (36.6 C)  SpO2: 100%   Filed Weights   02/26/22 0949  Weight: 242 lb 12.8 oz (110.1 kg)      LABORATORY DATA:  I have reviewed the data as listed CMP Latest Ref Rng & Units 02/19/2022 02/12/2022 02/05/2022  Glucose 70 - 99 mg/dL 93 91 103(H)  BUN 6 - 20 mg/dL _0 Creatinine 0.44 - 1.00 mg/dL 0.61 0.66 0.58  Sodium 135 - 145 mmol/L 139 136 138  Potassium 3.5 - 5.1 mmol/L 3.9 4.1 3.9  Chloride 98 - 111 mmol/L 106 104 105  CO2 22 - 32 mmol/L _1 Calcium 8.9 - 10.3 mg/dL 9.2 9.4 9.1  Total Protein 6.5 - 8.1 g/dL 7.3 7.7 7.5  Total Bilirubin 0.3 - 1.2 mg/dL 0.4 0.5 0.4  Alkaline Phos 38 - 126 U/L 71 78 82  AST 15 - 41 U/L _2 ALT 0 - 44 U/L _3 Lab Results  Component Value Date   WBC 4.3 02/26/2022   HGB 9.2 (L) 02/26/2022   HCT 30.2 (L) 02/26/2022   MCV 90.4 02/26/2022   PLT 371 02/26/2022   NEUTROABS 2.8 02/26/2022    ASSESSMENT & PLAN:  Malignant neoplasm of upper-outer quadrant of left breast in female, estrogen receptor positive ([place]) 10/04/2021: Palpable left breast mass and left breast swelling for 2 months, mammogram revealed 9 cm abnormality at 2 o'clock position plus another mass at 8:00 measuring 1.3 cm: Biopsy of both revealed grade 3 IDC ER 95%, PR 0%, HER2 negative, Ki-67 40%, 5 abnormal lymph nodes: Biopsy of 1 was positive (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 Bone scan 10/23/2021: No evidence of bony metastases.  Left mandible activity benign   Treatment plan: 1.  Neoadjuvant chemotherapy with dose dense Adriamycin and Cytoxan followed by Taxol 2. modified radical mastectomy 3.  Adjuvant radiation 4.  Adjuvant antiestrogen therapy with ovarian function suppression plus + AI + Verzinio ------------------------------------------------------------------------------------------------------------------------------------------- Current treatment: Completed 4 cycles of dose dense Adriamycin and Cytoxan, Today is cycle 10 Taxol Echocardiogram 10/27/2021: EF 60 to 65%   Chemo toxicities: Fatigue  2.  Labs are pending at the completion of today's visit   We discussed neuropathy and what to monitor moving forward.  She knows to let us know whether she develops it again, and will make an appointment if it is in her off week.     Return to clinic weekly for Taxol and every other week for follow up with me We will arrange a breast MRI to be  done after the last cycle of treatment. I sent a message to Dr. Georgette Dover to see her afterwards.  Her port can be removed during surgery.   Orders Placed This Encounter  Procedures   MR BREAST BILATERAL W WO CONTRAST INC CAD    Standing Status:   Future    Standing Expiration Date:   02/27/2023    Order Specific Question:   If indicated for the ordered procedure, I authorize the administration of contrast media per Radiology protocol    Answer:   Yes    Order Specific Question:   What is the patient's sedation requirement?    Answer:   No Sedation    Order Specific Question:   Does the patient have a pacemaker or implanted devices?    Answer:   No    Order Specific Question:   Preferred imaging location?    Answer:   [facility] (table limit-550lbs)    Order Specific Question:   Release to patient    Answer:   Immediate   The patient has a good  understanding of the overall plan. she agrees with it. she will call with any problems that may develop before the next visit here. Total time spent: 30 mins including face to face time and time spent for planning, charting and co-ordination of care   Harriette Ohara, MD 02/26/22  I Gardiner Coins is acting as a scribe for Dr. Lindi Adie

## 2022-02-23 MED FILL — Dexamethasone Sodium Phosphate Inj 100 MG/10ML: INTRAMUSCULAR | Qty: 1 | Status: AC

## 2022-02-26 ENCOUNTER — Inpatient Hospital Stay (HOSPITAL_BASED_OUTPATIENT_CLINIC_OR_DEPARTMENT_OTHER): Payer: Medicaid Other | Admitting: Hematology and Oncology

## 2022-02-26 ENCOUNTER — Inpatient Hospital Stay: Payer: Medicaid Other

## 2022-02-26 ENCOUNTER — Encounter: Payer: Self-pay | Admitting: *Deleted

## 2022-02-26 ENCOUNTER — Other Ambulatory Visit: Payer: Self-pay

## 2022-02-26 VITALS — BP 116/43 | HR 80 | Temp 97.9°F | Resp 18 | Ht 70.0 in | Wt 242.8 lb

## 2022-02-26 DIAGNOSIS — Z170 Estrogen receptor positive status [ER+]: Secondary | ICD-10-CM | POA: Diagnosis not present

## 2022-02-26 DIAGNOSIS — Z5111 Encounter for antineoplastic chemotherapy: Secondary | ICD-10-CM | POA: Diagnosis not present

## 2022-02-26 DIAGNOSIS — C50412 Malignant neoplasm of upper-outer quadrant of left female breast: Secondary | ICD-10-CM | POA: Diagnosis not present

## 2022-02-26 DIAGNOSIS — Z95828 Presence of other vascular implants and grafts: Secondary | ICD-10-CM

## 2022-02-26 LAB — CBC WITH DIFFERENTIAL (CANCER CENTER ONLY)
Abs Immature Granulocytes: 0.01 10*3/uL (ref 0.00–0.07)
Basophils Absolute: 0 10*3/uL (ref 0.0–0.1)
Basophils Relative: 1 %
Eosinophils Absolute: 0.1 10*3/uL (ref 0.0–0.5)
Eosinophils Relative: 3 %
HCT: 30.2 % — ABNORMAL LOW (ref 36.0–46.0)
Hemoglobin: 9.2 g/dL — ABNORMAL LOW (ref 12.0–15.0)
Immature Granulocytes: 0 %
Lymphocytes Relative: 21 %
Lymphs Abs: 0.9 10*3/uL (ref 0.7–4.0)
MCH: 27.5 pg (ref 26.0–34.0)
MCHC: 30.5 g/dL (ref 30.0–36.0)
MCV: 90.4 fL (ref 80.0–100.0)
Monocytes Absolute: 0.4 10*3/uL (ref 0.1–1.0)
Monocytes Relative: 8 %
Neutro Abs: 2.8 10*3/uL (ref 1.7–7.7)
Neutrophils Relative %: 67 %
Platelet Count: 371 10*3/uL (ref 150–400)
RBC: 3.34 MIL/uL — ABNORMAL LOW (ref 3.87–5.11)
RDW: 17.6 % — ABNORMAL HIGH (ref 11.5–15.5)
WBC Count: 4.3 10*3/uL (ref 4.0–10.5)
nRBC: 0.7 % — ABNORMAL HIGH (ref 0.0–0.2)

## 2022-02-26 LAB — CMP (CANCER CENTER ONLY)
ALT: 12 U/L (ref 0–44)
AST: 13 U/L — ABNORMAL LOW (ref 15–41)
Albumin: 3.6 g/dL (ref 3.5–5.0)
Alkaline Phosphatase: 74 U/L (ref 38–126)
Anion gap: 5 (ref 5–15)
BUN: 9 mg/dL (ref 6–20)
CO2: 28 mmol/L (ref 22–32)
Calcium: 9 mg/dL (ref 8.9–10.3)
Chloride: 105 mmol/L (ref 98–111)
Creatinine: 0.54 mg/dL (ref 0.44–1.00)
GFR, Estimated: >60 mL/min (ref >60)
Glucose, Bld: 85 mg/dL (ref 70–99)
Potassium: 3.8 mmol/L (ref 3.5–5.1)
Sodium: 138 mmol/L (ref 135–145)
Total Bilirubin: 0.5 mg/dL (ref 0.3–1.2)
Total Protein: 6.7 g/dL (ref 6.5–8.1)

## 2022-02-26 MED ORDER — HEPARIN SOD (PORK) LOCK FLUSH 100 UNIT/ML IV SOLN
500.0000 [IU] | Freq: Once | INTRAVENOUS | Status: AC | PRN
  Administered 2022-02-26: 500 [IU]

## 2022-02-26 MED ORDER — SODIUM CHLORIDE 0.9% FLUSH
10.0000 mL | Freq: Once | INTRAVENOUS | Status: AC
  Administered 2022-02-26: 10 mL

## 2022-02-26 MED ORDER — SODIUM CHLORIDE 0.9% FLUSH
10.0000 mL | INTRAVENOUS | Status: DC | PRN
  Administered 2022-02-26: 10 mL

## 2022-02-26 MED ORDER — SODIUM CHLORIDE 0.9 % IV SOLN: Freq: Once | INTRAVENOUS | Status: AC

## 2022-02-26 MED ORDER — SODIUM CHLORIDE 0.9 % IV SOLN
80.0000 mg/m2 | Freq: Once | INTRAVENOUS | Status: AC
  Administered 2022-02-26: 180 mg via INTRAVENOUS
  Filled 2022-02-26: qty 30

## 2022-02-26 MED ORDER — FAMOTIDINE IN NACL 20-0.9 MG/50ML-% IV SOLN
20.0000 mg | Freq: Once | INTRAVENOUS | Status: AC
  Administered 2022-02-26: 20 mg via INTRAVENOUS
  Filled 2022-02-26: qty 50

## 2022-02-26 MED ORDER — SODIUM CHLORIDE 0.9 % IV SOLN
10.0000 mg | Freq: Once | INTRAVENOUS | Status: AC
  Administered 2022-02-26: 10 mg via INTRAVENOUS
  Filled 2022-02-26: qty 10

## 2022-02-26 MED ORDER — DIPHENHYDRAMINE HCL 50 MG/ML IJ SOLN
25.0000 mg | Freq: Once | INTRAMUSCULAR | Status: AC
  Administered 2022-02-26: 25 mg via INTRAVENOUS
  Filled 2022-02-26: qty 1

## 2022-02-26 NOTE — Assessment & Plan Note (Signed)
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 ?CT CAP 10/24/2021: Left breast cancer and left axillary lymph nodes, no evidence of distant metastatic disease. ?Bone scan 10/23/2021: No evidence of bony metastases. ?Left mandible activity benign ?? ?Treatment plan: ?1.??Neoadjuvant chemotherapy with dose dense Adriamycin and Cytoxan followed by Taxol ?2.?modified radical mastectomy ?3.??Adjuvant radiation ?4.??Adjuvant antiestrogen therapy with ovarian function suppression plus + AI + Verzinio ?------------------------------------------------------------------------------------------------------------------------------------------- ?Current treatment:?Completed 4 cycles of?dose dense Adriamycin and Cytoxan, Today is cycle?10?Taxol ?Echocardiogram 10/27/2021: EF 60 to 65% ?? ?Chemo toxicities: ?1. Fatigue? ?2.  Labs are pending at the completion of today's visit ?? ?We discussed neuropathy and what to monitor moving forward.  She knows to let us know whether she develops it again, and will make an appointment if it is in her off week.   ?? ?Return to clinic?weekly for Taxol and every other week for follow up with me ?

## 2022-03-01 ENCOUNTER — Encounter: Payer: Self-pay | Admitting: *Deleted

## 2022-03-05 ENCOUNTER — Inpatient Hospital Stay: Payer: Medicaid Other

## 2022-03-05 ENCOUNTER — Other Ambulatory Visit: Payer: Self-pay

## 2022-03-05 VITALS — BP 123/53 | HR 71 | Temp 98.8°F | Resp 17 | Wt 245.0 lb

## 2022-03-05 DIAGNOSIS — Z170 Estrogen receptor positive status [ER+]: Secondary | ICD-10-CM

## 2022-03-05 DIAGNOSIS — Z95828 Presence of other vascular implants and grafts: Secondary | ICD-10-CM

## 2022-03-05 DIAGNOSIS — Z5111 Encounter for antineoplastic chemotherapy: Secondary | ICD-10-CM | POA: Diagnosis not present

## 2022-03-05 LAB — CBC WITH DIFFERENTIAL (CANCER CENTER ONLY)
Abs Immature Granulocytes: 0.03 10*3/uL (ref 0.00–0.07)
Basophils Absolute: 0.1 10*3/uL (ref 0.0–0.1)
Basophils Relative: 1 %
Eosinophils Absolute: 0.1 10*3/uL (ref 0.0–0.5)
Eosinophils Relative: 2 %
HCT: 30.1 % — ABNORMAL LOW (ref 36.0–46.0)
Hemoglobin: 9.5 g/dL — ABNORMAL LOW (ref 12.0–15.0)
Immature Granulocytes: 1 %
Lymphocytes Relative: 27 %
Lymphs Abs: 1.5 10*3/uL (ref 0.7–4.0)
MCH: 28.4 pg (ref 26.0–34.0)
MCHC: 31.6 g/dL (ref 30.0–36.0)
MCV: 90.1 fL (ref 80.0–100.0)
Monocytes Absolute: 0.5 10*3/uL (ref 0.1–1.0)
Monocytes Relative: 8 %
Neutro Abs: 3.4 10*3/uL (ref 1.7–7.7)
Neutrophils Relative %: 61 %
Platelet Count: 389 10*3/uL (ref 150–400)
RBC: 3.34 MIL/uL — ABNORMAL LOW (ref 3.87–5.11)
RDW: 17.5 % — ABNORMAL HIGH (ref 11.5–15.5)
Smear Review: NORMAL
WBC Count: 5.5 10*3/uL (ref 4.0–10.5)
nRBC: 1.1 % — ABNORMAL HIGH (ref 0.0–0.2)

## 2022-03-05 LAB — CMP (CANCER CENTER ONLY)
ALT: 11 U/L (ref 0–44)
AST: 13 U/L — ABNORMAL LOW (ref 15–41)
Albumin: 3.7 g/dL (ref 3.5–5.0)
Alkaline Phosphatase: 69 U/L (ref 38–126)
Anion gap: 6 (ref 5–15)
BUN: 12 mg/dL (ref 6–20)
CO2: 27 mmol/L (ref 22–32)
Calcium: 9.1 mg/dL (ref 8.9–10.3)
Chloride: 106 mmol/L (ref 98–111)
Creatinine: 0.54 mg/dL (ref 0.44–1.00)
GFR, Estimated: >60 mL/min (ref >60)
Glucose, Bld: 90 mg/dL (ref 70–99)
Potassium: 3.8 mmol/L (ref 3.5–5.1)
Sodium: 139 mmol/L (ref 135–145)
Total Bilirubin: 0.4 mg/dL (ref 0.3–1.2)
Total Protein: 7.2 g/dL (ref 6.5–8.1)

## 2022-03-05 MED ORDER — DIPHENHYDRAMINE HCL 50 MG/ML IJ SOLN
25.0000 mg | Freq: Once | INTRAMUSCULAR | Status: AC
  Administered 2022-03-05: 25 mg via INTRAVENOUS
  Filled 2022-03-05: qty 1

## 2022-03-05 MED ORDER — HEPARIN SOD (PORK) LOCK FLUSH 100 UNIT/ML IV SOLN
500.0000 [IU] | Freq: Once | INTRAVENOUS | Status: AC | PRN
  Administered 2022-03-05: 500 [IU]

## 2022-03-05 MED ORDER — SODIUM CHLORIDE 0.9% FLUSH
10.0000 mL | Freq: Once | INTRAVENOUS | Status: AC
  Administered 2022-03-05: 10 mL

## 2022-03-05 MED ORDER — FAMOTIDINE IN NACL 20-0.9 MG/50ML-% IV SOLN
20.0000 mg | Freq: Once | INTRAVENOUS | Status: AC
  Administered 2022-03-05: 20 mg via INTRAVENOUS
  Filled 2022-03-05: qty 50

## 2022-03-05 MED ORDER — SODIUM CHLORIDE 0.9 % IV SOLN
80.0000 mg/m2 | Freq: Once | INTRAVENOUS | Status: AC
  Administered 2022-03-05: 180 mg via INTRAVENOUS
  Filled 2022-03-05: qty 30

## 2022-03-05 MED ORDER — SODIUM CHLORIDE 0.9 % IV SOLN
10.0000 mg | Freq: Once | INTRAVENOUS | Status: AC
  Administered 2022-03-05: 10 mg via INTRAVENOUS
  Filled 2022-03-05: qty 10

## 2022-03-05 MED ORDER — SODIUM CHLORIDE 0.9 % IV SOLN: Freq: Once | INTRAVENOUS | Status: AC

## 2022-03-05 MED ORDER — SODIUM CHLORIDE 0.9% FLUSH
10.0000 mL | INTRAVENOUS | Status: DC | PRN
  Administered 2022-03-05: 10 mL

## 2022-03-05 NOTE — Progress Notes (Signed)
Ok to treat without ANC results today per Dr Lindi Adie ?

## 2022-03-05 NOTE — Patient Instructions (Signed)
[facility]  Discharge Instructions: ?Thank you for choosing [facility] to provide your oncology and hematology care.  ? ?If you have a lab appointment with the [facility], please go directly to the [facility] and check in at the registration area. ?  ?Wear comfortable clothing and clothing appropriate for easy access to any Portacath or PICC line.  ? ?We strive to give you quality time with your provider. You may need to reschedule your appointment if you arrive late (15 or more minutes).  Arriving late affects you and other patients whose appointments are after yours.  Also, if you miss three or more appointments without notifying the office, you may be dismissed from the clinic at the provider?s discretion.    ?  ?For prescription refill requests, have your pharmacy contact our office and allow 72 hours for refills to be completed.   ? ?Today you received the following chemotherapy and/or immunotherapy agents: Paclitaxel    ?  ?To help prevent nausea and vomiting after your treatment, we encourage you to take your nausea medication as directed. ? ?BELOW ARE SYMPTOMS THAT SHOULD BE REPORTED IMMEDIATELY: ?*FEVER GREATER THAN 100.4 F (38 ?C) OR HIGHER ?*CHILLS OR SWEATING ?*NAUSEA AND VOMITING THAT IS NOT CONTROLLED WITH YOUR NAUSEA MEDICATION ?*UNUSUAL SHORTNESS OF BREATH ?*UNUSUAL BRUISING OR BLEEDING ?*URINARY PROBLEMS (pain or burning when urinating, or frequent urination) ?*BOWEL PROBLEMS (unusual diarrhea, constipation, pain near the anus) ?TENDERNESS IN MOUTH AND THROAT WITH OR WITHOUT PRESENCE OF ULCERS (sore throat, sores in mouth, or a toothache) ?UNUSUAL RASH, SWELLING OR PAIN  ?UNUSUAL VAGINAL DISCHARGE OR ITCHING  ? ?Items with * indicate a potential emergency and should be followed up as soon as possible or go to the Emergency Department if any problems should occur. ? ?Please show the CHEMOTHERAPY ALERT CARD or IMMUNOTHERAPY ALERT CARD at check-in to  the Emergency Department and triage nurse. ? ?Should you have questions after your visit or need to cancel or reschedule your appointment, please contact [facility]  Dept: [phone]  and follow the prompts.  Office hours are 8:00 a.m. to 4:30 p.m. Monday - Friday. Please note that voicemails left after 4:00 p.m. may not be returned until the following business day.  We are closed weekends and major holidays. You have access to a nurse at all times for urgent questions. Please call the main number to the clinic Dept: [phone] and follow the prompts. ? ? ?For any non-urgent questions, you may also contact your provider using MyChart. We now offer e-Visits for anyone 106 and older to request care online for non-urgent symptoms. For details visit [email or web address]. ?  ?Also download the MyChart app! Go to the app store, search "MyChart", open the app, select , and log in with your MyChart username and password. ? ?Due to Covid, a mask is required upon entering the hospital/clinic. If you do not have a mask, one will be given to you upon arrival. For doctor visits, patients may have 1 support person aged 50 or older with them. For treatment visits, patients cannot have anyone with them due to current Covid guidelines and our immunocompromised population.  ? ?

## 2022-03-09 NOTE — Progress Notes (Signed)
? ?Patient Care Team: ?Elsie Stain, MD as PCP - General (Pulmonary Disease) ?Rockwell Germany, RN as Oncology Nurse Navigator ?Mauro Kaufmann, RN as Oncology Nurse Navigator ?Nicholas Lose, MD as Consulting Physician (Hematology and Oncology) ?Donnie Mesa, MD as Consulting Physician (General Surgery) ? ?DIAGNOSIS:  ?Encounter Diagnosis  ?Name Primary?  ? Malignant neoplasm of upper-outer quadrant of left breast in female, estrogen receptor positive ([place])   ? ? ?SUMMARY OF ONCOLOGIC HISTORY: ?Oncology History  ?Malignant neoplasm of upper-outer quadrant of left breast in female, estrogen receptor positive ([place])  ?10/04/2021 Initial Diagnosis  ? 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  ?10/11/2021 Cancer Staging  ? Staging form: Breast, AJCC 8th Edition ?- Clinical stage from 10/11/2021: Stage IV (cT3, cN1, cM1, G3, ER+, PR-, HER2-) - Signed by Nicholas Lose, MD on 10/11/2021 ?Stage prefix: Initial diagnosis ?Histologic grading system: 3 grade system ? ?  ?10/20/2021 Genetic Testing  ? Negative hereditary cancer genetic testing: no pathogenic variants detected in [facility] Breast Cancer STAT Panel or Multi-Cancer +RNA Panel.  Variants of uncertain significance detected in POT1 at c.476T>C (p.Met159Thr) and PRKAR1A at c.27T>G (p.Ser9Arg).  The report dates are October 19, 2021 and October 29, 2021, respectively.  ? ?The Multi-Cancer + RNA Panel offered by [facility] includes sequencing and/or deletion/duplication analysis of the following 84 genes:  AIP*, ALK, APC*, ATM*, AXIN2*, BAP1*, BARD1*, BLM*, BMPR1A*, BRCA1*, BRCA2*, BRIP1*, CASR, CDC73*, CDH1*, CDK4, CDKN1B*, CDKN1C*, CDKN2A, CEBPA, CHEK2*, CTNNA1*, DICER1*, DIS3L2*,  EGFR, EPCAM, FH*, FLCN*, GATA2*, GPC3, GREM1, HOXB13, HRAS, KIT, MAX*, MEN1*, MET, MITF, MLH1*, MSH2*, MSH3*, MSH6*, MUTYH*, NBN*, NF1*, NF2*, NTHL1*, PALB2*, PDGFRA, PHOX2B, PMS2*, POLD1*, POLE*, POT1*, PRKAR1A*, PTCH1*, PTEN*, RAD50*, RAD51C*, RAD51D*, RB1*, RECQL4, RET, RUNX1*, SDHA*, SDHAF2*, SDHB*, SDHC*, SDHD*, SMAD4*, SMARCA4*, SMARCB1*, SMARCE1*, STK11*, SUFU*, TERC, TERT, TMEM127*, Tp53*, TSC1*, TSC2*, VHL*, WRN*, and WT1.  RNA analysis is performed for * genes. ?  ?11/01/2021 -  Chemotherapy  ? Patient is on Treatment Plan : BREAST ADJUVANT DOSE DENSE AC q14d / PACLitaxel q7d  ?   ? ? ?CHIEF COMPLIANT: Cycle 12 Taxol ? ?INTERVAL HISTORY: Alicia Morton is a 41 y.o. with above-mentioned history of breast cancer currently on chemotherapy with Taxol. She presents to the clinic today for treatment. She state she had some fatigue from the last treatment. Denies numbness and tingling in toes.  She denies any problems or concerns.  She has developed fatigue after the last chemo but it has recovered.  Denies any nausea or vomiting.  Denies neuropathy.  Darkening of nailbeds is present. ? ? ?ALLERGIES:  has No Known Allergies. ? ?MEDICATIONS:  ?Current Outpatient Medications  ?Medication Sig Dispense Refill  ? albuterol (PROVENTIL) (2.5 MG/3ML) 0.083% nebulizer solution Take 3 mLs (2.5 mg total) by nebulization every 6 (six) hours as needed for wheezing or shortness of breath. 150 mL 1  ? albuterol (VENTOLIN HFA) 108 (90 Base) MCG/ACT inhaler Inhale 1-2 puffs into the lungs every 6 (six) hours as needed for wheezing or shortness of breath. 18 g 0  ? cetirizine (ZYRTEC) 10 MG tablet Take 10 mg by mouth daily.    ? lidocaine-prilocaine (EMLA) cream Apply to affected area once 30 g 3  ?  ondansetron (ZOFRAN) 8 MG tablet Take 1 tablet (8 mg total) by mouth 2 (two) times daily as needed. Start on the third day after chemotherapy. 30 tablet 1  ? prochlorperazine (COMPAZINE) 10 MG tablet Take 1 tablet (10 mg total) by  mouth every 6 (six) hours as needed (Nausea or vomiting). 30 tablet 1  ? ?No current facility-administered medications for this visit.  ? ?Facility-Administered Medications Ordered in Other Visits  ?Medication Dose Route Frequency Provider Last Rate Last Admin  ? sodium chloride flush (NS) 0.9 % injection 10 mL  10 mL Intracatheter Once Nicholas Lose, MD      ? ? ?PHYSICAL EXAMINATION: ?ECOG PERFORMANCE STATUS: 1 - Symptomatic but completely ambulatory ? ?Vitals:  ? 03/12/22 1046  ?BP: (!) 122/57  ?Pulse: 79  ?Resp: 18  ?Temp: (!) 97.5 ?F (36.4 ?C)  ?SpO2: 99%  ? ?Filed Weights  ? 03/12/22 1046  ?Weight: 242 lb 8 oz (110 kg)  ? ? ? ?LABORATORY DATA:  ?I have reviewed the data as listed ? ?  Latest Ref Rng & Units 03/05/2022  ?  9:51 AM 02/26/2022  ?  9:38 AM 02/19/2022  ?  1:26 PM  ?CMP  ?Glucose 70 - 99 mg/dL 90   85   93    ?BUN 6 - 20 mg/dL $Remove'12   9   14    'fdEYBCh$ ?Creatinine 0.44 - 1.00 mg/dL 0.54   0.54   0.61    ?Sodium 135 - 145 mmol/L 139   138   139    ?Potassium 3.5 - 5.1 mmol/L 3.8   3.8   3.9    ?Chloride 98 - 111 mmol/L 106   105   106    ?CO2 22 - 32 mmol/L $RemoveB'27   28   27    'hLziyGGL$ ?Calcium 8.9 - 10.3 mg/dL 9.1   9.0   9.2    ?Total Protein 6.5 - 8.1 g/dL 7.2   6.7   7.3    ?Total Bilirubin 0.3 - 1.2 mg/dL 0.4   0.5   0.4    ?Alkaline Phos 38 - 126 U/L 69   74   71    ?AST 15 - 41 U/L $Remo'13   13   16    'bCQDN$ ?ALT 0 - 44 U/L $Remo'11   12   13    'jDGqz$ ? ? ?Lab Results  ?Component Value Date  ? WBC 5.1 03/12/2022  ? HGB 9.7 (L) 03/12/2022  ? HCT 31.2 (L) 03/12/2022  ? MCV 89.7 03/12/2022  ? PLT 395 03/12/2022  ? NEUTROABS 3.4 03/12/2022  ? ? 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  ?CT CAP 10/24/2021: Left breast cancer and left axillary lymph nodes, no evidence of distant metastatic disease. ?Bone scan 10/23/2021: No evidence of bony metastases.  Left mandible activity benign ?  ?Treatment plan: ?1.  Neoadjuvant chemotherapy with dose dense Adriamycin and Cytoxan followed by Taxol ?2. modified radical mastectomy ?3.  Adjuvant radiation ?4.  Adjuvant antiestrogen therapy with ovarian function suppression plus + AI + Verzinio ?------------------------------------------------------------------------------------------------------------------------------------------- ?Current treatment: Completed 4  cycles of dose dense Adriamycin and Cytoxan, Today is cycle 12 Taxol ?Echocardiogram 10/27/2021: EF 60 to 65% ?  ?Chemo toxicities: ?Fatigue  ?2. monitoring closely for peripheral neuropathy ?Breast MRI has been scheduled for tomorrow ?She has appointments with Dr. Georgette Dover after the MRI. ?The port to be removed during surgery. ? ?Return to clinic after surgery to discuss final pathology results. ? ? ? ?No orders of the defined types were placed in this encounter. ? ?The patient has a good understanding of the overall plan. she agrees with it. she will call with any problems that may develop before the next visit here. ?Total time spent: 30 mins including face to face time and time spent for planning, charting and co-ordination of care ? ? Harriette Ohara, MD ?03/12/22 ? ? ? I Gardiner Coins am scribing for Dr. Lindi Adie ? ?I have reviewed the above documentation for accuracy and completeness, and I agree with the above. ?. ?

## 2022-03-12 ENCOUNTER — Encounter: Payer: Self-pay | Admitting: *Deleted

## 2022-03-12 ENCOUNTER — Inpatient Hospital Stay (HOSPITAL_BASED_OUTPATIENT_CLINIC_OR_DEPARTMENT_OTHER): Payer: Medicaid Other | Admitting: Hematology and Oncology

## 2022-03-12 ENCOUNTER — Inpatient Hospital Stay: Payer: Medicaid Other

## 2022-03-12 ENCOUNTER — Other Ambulatory Visit: Payer: Self-pay

## 2022-03-12 DIAGNOSIS — Z170 Estrogen receptor positive status [ER+]: Secondary | ICD-10-CM

## 2022-03-12 DIAGNOSIS — C50412 Malignant neoplasm of upper-outer quadrant of left female breast: Secondary | ICD-10-CM | POA: Diagnosis not present

## 2022-03-12 DIAGNOSIS — Z95828 Presence of other vascular implants and grafts: Secondary | ICD-10-CM

## 2022-03-12 DIAGNOSIS — Z5111 Encounter for antineoplastic chemotherapy: Secondary | ICD-10-CM | POA: Diagnosis not present

## 2022-03-12 LAB — CMP (CANCER CENTER ONLY)
ALT: 12 U/L (ref 0–44)
AST: 16 U/L (ref 15–41)
Albumin: 3.7 g/dL (ref 3.5–5.0)
Alkaline Phosphatase: 73 U/L (ref 38–126)
Anion gap: 5 (ref 5–15)
BUN: 10 mg/dL (ref 6–20)
CO2: 28 mmol/L (ref 22–32)
Calcium: 9.2 mg/dL (ref 8.9–10.3)
Chloride: 104 mmol/L (ref 98–111)
Creatinine: 0.57 mg/dL (ref 0.44–1.00)
GFR, Estimated: >60 mL/min (ref >60)
Glucose, Bld: 90 mg/dL (ref 70–99)
Potassium: 3.8 mmol/L (ref 3.5–5.1)
Sodium: 137 mmol/L (ref 135–145)
Total Bilirubin: 0.5 mg/dL (ref 0.3–1.2)
Total Protein: 7.2 g/dL (ref 6.5–8.1)

## 2022-03-12 LAB — CBC WITH DIFFERENTIAL (CANCER CENTER ONLY)
Abs Immature Granulocytes: 0.01 10*3/uL (ref 0.00–0.07)
Basophils Absolute: 0.1 10*3/uL (ref 0.0–0.1)
Basophils Relative: 1 %
Eosinophils Absolute: 0.2 10*3/uL (ref 0.0–0.5)
Eosinophils Relative: 4 %
HCT: 31.2 % — ABNORMAL LOW (ref 36.0–46.0)
Hemoglobin: 9.7 g/dL — ABNORMAL LOW (ref 12.0–15.0)
Immature Granulocytes: 0 %
Lymphocytes Relative: 23 %
Lymphs Abs: 1.2 10*3/uL (ref 0.7–4.0)
MCH: 27.9 pg (ref 26.0–34.0)
MCHC: 31.1 g/dL (ref 30.0–36.0)
MCV: 89.7 fL (ref 80.0–100.0)
Monocytes Absolute: 0.4 10*3/uL (ref 0.1–1.0)
Monocytes Relative: 7 %
Neutro Abs: 3.4 10*3/uL (ref 1.7–7.7)
Neutrophils Relative %: 65 %
Platelet Count: 395 10*3/uL (ref 150–400)
RBC: 3.48 MIL/uL — ABNORMAL LOW (ref 3.87–5.11)
RDW: 17.6 % — ABNORMAL HIGH (ref 11.5–15.5)
WBC Count: 5.1 10*3/uL (ref 4.0–10.5)
nRBC: 0.6 % — ABNORMAL HIGH (ref 0.0–0.2)

## 2022-03-12 MED ORDER — SODIUM CHLORIDE 0.9 % IV SOLN
10.0000 mg | Freq: Once | INTRAVENOUS | Status: AC
  Administered 2022-03-12: 10 mg via INTRAVENOUS
  Filled 2022-03-12: qty 10

## 2022-03-12 MED ORDER — DIPHENHYDRAMINE HCL 50 MG/ML IJ SOLN
25.0000 mg | Freq: Once | INTRAMUSCULAR | Status: AC
  Administered 2022-03-12: 25 mg via INTRAVENOUS
  Filled 2022-03-12: qty 1

## 2022-03-12 MED ORDER — FAMOTIDINE IN NACL 20-0.9 MG/50ML-% IV SOLN
20.0000 mg | Freq: Once | INTRAVENOUS | Status: AC
  Administered 2022-03-12: 20 mg via INTRAVENOUS
  Filled 2022-03-12: qty 50

## 2022-03-12 MED ORDER — SODIUM CHLORIDE 0.9 % IV SOLN
80.0000 mg/m2 | Freq: Once | INTRAVENOUS | Status: AC
  Administered 2022-03-12: 180 mg via INTRAVENOUS
  Filled 2022-03-12: qty 30

## 2022-03-12 MED ORDER — SODIUM CHLORIDE 0.9% FLUSH
10.0000 mL | Freq: Once | INTRAVENOUS | Status: AC
  Administered 2022-03-12: 10 mL

## 2022-03-12 MED ORDER — SODIUM CHLORIDE 0.9 % IV SOLN: Freq: Once | INTRAVENOUS | Status: AC

## 2022-03-12 MED ORDER — SODIUM CHLORIDE 0.9% FLUSH
10.0000 mL | INTRAVENOUS | Status: DC | PRN
  Administered 2022-03-12: 10 mL

## 2022-03-12 MED ORDER — HEPARIN SOD (PORK) LOCK FLUSH 100 UNIT/ML IV SOLN
500.0000 [IU] | Freq: Once | INTRAVENOUS | Status: AC | PRN
  Administered 2022-03-12: 500 [IU]

## 2022-03-12 NOTE — Patient Instructions (Signed)
[facility]  Discharge Instructions: ?Thank you for choosing [facility] to provide your oncology and hematology care.  ? ?If you have a lab appointment with the [facility], please go directly to the [facility] and check in at the registration area. ?  ?Wear comfortable clothing and clothing appropriate for easy access to any Portacath or PICC line.  ? ?We strive to give you quality time with your provider. You may need to reschedule your appointment if you arrive late (15 or more minutes).  Arriving late affects you and other patients whose appointments are after yours.  Also, if you miss three or more appointments without notifying the office, you may be dismissed from the clinic at the provider?s discretion.    ?  ?For prescription refill requests, have your pharmacy contact our office and allow 72 hours for refills to be completed.   ? ?Today you received the following chemotherapy and/or immunotherapy agents: Paclitaxel    ?  ?To help prevent nausea and vomiting after your treatment, we encourage you to take your nausea medication as directed. ? ?BELOW ARE SYMPTOMS THAT SHOULD BE REPORTED IMMEDIATELY: ?*FEVER GREATER THAN 100.4 F (38 ?C) OR HIGHER ?*CHILLS OR SWEATING ?*NAUSEA AND VOMITING THAT IS NOT CONTROLLED WITH YOUR NAUSEA MEDICATION ?*UNUSUAL SHORTNESS OF BREATH ?*UNUSUAL BRUISING OR BLEEDING ?*URINARY PROBLEMS (pain or burning when urinating, or frequent urination) ?*BOWEL PROBLEMS (unusual diarrhea, constipation, pain near the anus) ?TENDERNESS IN MOUTH AND THROAT WITH OR WITHOUT PRESENCE OF ULCERS (sore throat, sores in mouth, or a toothache) ?UNUSUAL RASH, SWELLING OR PAIN  ?UNUSUAL VAGINAL DISCHARGE OR ITCHING  ? ?Items with * indicate a potential emergency and should be followed up as soon as possible or go to the Emergency Department if any problems should occur. ? ?Please show the CHEMOTHERAPY ALERT CARD or IMMUNOTHERAPY ALERT CARD at check-in to  the Emergency Department and triage nurse. ? ?Should you have questions after your visit or need to cancel or reschedule your appointment, please contact [facility]  Dept: [phone]  and follow the prompts.  Office hours are 8:00 a.m. to 4:30 p.m. Monday - Friday. Please note that voicemails left after 4:00 p.m. may not be returned until the following business day.  We are closed weekends and major holidays. You have access to a nurse at all times for urgent questions. Please call the main number to the clinic Dept: [phone] and follow the prompts. ? ? ?For any non-urgent questions, you may also contact your provider using MyChart. We now offer e-Visits for anyone 19 and older to request care online for non-urgent symptoms. For details visit [email or web address]. ?  ?Also download the MyChart app! Go to the app store, search "MyChart", open the app, select [facility], and log in with your MyChart username and password. ? ?Due to Covid, a mask is required upon entering the hospital/clinic. If you do not have a mask, one will be given to you upon arrival. For doctor visits, patients may have 1 support person aged 24 or older with them. For treatment visits, patients cannot have anyone with them due to current Covid guidelines and our immunocompromised population.  ? ?

## 2022-03-12 NOTE — Assessment & Plan Note (Signed)
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 ?CT CAP 10/24/2021: Left breast cancer and left axillary lymph nodes, no evidence of distant metastatic disease. ?Bone scan 10/23/2021: No evidence of bony metastases. ?Left mandible activity benign ?? ?Treatment plan: ?1.??Neoadjuvant chemotherapy with dose dense Adriamycin and Cytoxan followed by Taxol ?2.?modified radical mastectomy ?3.??Adjuvant radiation ?4.??Adjuvant antiestrogen therapy with ovarian function suppression plus + AI + Verzinio ?------------------------------------------------------------------------------------------------------------------------------------------- ?Current treatment:?Completed 4 cycles of?dose dense Adriamycin and Cytoxan, Today is cycle?12?Taxol ?Echocardiogram 10/27/2021: EF 60 to 65% ?? ?Chemo toxicities: ?1. Fatigue? ?2. monitoring closely for peripheral neuropathy ?Breast MRI has been scheduled for tomorrow ?She has appointments with Dr. Georgette Dover after the MRI. ?The port to be removed during surgery. ? ?Return to clinic after surgery to discuss final pathology results. ?

## 2022-03-13 ENCOUNTER — Ambulatory Visit
Admission: RE | Admit: 2022-03-13 | Discharge: 2022-03-13 | Disposition: A | Discharge status: Home / Self Care | Payer: Medicaid Other | Source: Ambulatory Visit | Attending: Hematology and Oncology | Admitting: Hematology and Oncology

## 2022-03-13 ENCOUNTER — Encounter: Payer: Self-pay | Admitting: Hematology and Oncology

## 2022-03-13 DIAGNOSIS — Z170 Estrogen receptor positive status [ER+]: Secondary | ICD-10-CM

## 2022-03-13 MED ORDER — GADOBUTROL 1 MMOL/ML IV SOLN
10.0000 mL | Freq: Once | INTRAVENOUS | Status: AC | PRN
  Administered 2022-03-13: 10 mL via INTRAVENOUS

## 2022-03-14 ENCOUNTER — Encounter: Payer: Self-pay | Admitting: *Deleted

## 2022-03-19 ENCOUNTER — Ambulatory Visit: Payer: Medicaid Other

## 2022-03-19 ENCOUNTER — Other Ambulatory Visit: Payer: Medicaid Other

## 2022-03-19 ENCOUNTER — Ambulatory Visit: Payer: Self-pay | Admitting: Surgery
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
## 2022-03-21 ENCOUNTER — Telehealth: Payer: Self-pay | Admitting: Hematology and Oncology

## 2022-03-21 NOTE — Telephone Encounter (Signed)
.  Called patient to schedule appointment per 4/3 inbasket, patient is aware of date and time.   

## 2022-03-23 ENCOUNTER — Inpatient Hospital Stay (HOSPITAL_BASED_OUTPATIENT_CLINIC_OR_DEPARTMENT_OTHER): Payer: Medicaid Other | Admitting: Adult Health

## 2022-03-23 ENCOUNTER — Encounter: Payer: Self-pay | Admitting: Adult Health

## 2022-03-23 ENCOUNTER — Telehealth: Payer: Self-pay | Admitting: *Deleted

## 2022-03-23 ENCOUNTER — Other Ambulatory Visit: Payer: Self-pay

## 2022-03-23 ENCOUNTER — Other Ambulatory Visit: Payer: Self-pay | Admitting: *Deleted

## 2022-03-23 ENCOUNTER — Inpatient Hospital Stay: Payer: Medicaid Other | Attending: Hematology and Oncology

## 2022-03-23 VITALS — BP 123/80 | HR 85 | Temp 97.9°F | Resp 16 | Ht 70.0 in | Wt 248.3 lb

## 2022-03-23 DIAGNOSIS — Z170 Estrogen receptor positive status [ER+]: Secondary | ICD-10-CM | POA: Diagnosis present

## 2022-03-23 DIAGNOSIS — R5383 Other fatigue: Secondary | ICD-10-CM | POA: Insufficient documentation

## 2022-03-23 DIAGNOSIS — Z87891 Personal history of nicotine dependence: Secondary | ICD-10-CM | POA: Insufficient documentation

## 2022-03-23 DIAGNOSIS — C50412 Malignant neoplasm of upper-outer quadrant of left female breast: Secondary | ICD-10-CM

## 2022-03-23 DIAGNOSIS — Z95828 Presence of other vascular implants and grafts: Secondary | ICD-10-CM

## 2022-03-23 LAB — CMP (CANCER CENTER ONLY)
ALT: 11 U/L (ref 0–44)
AST: 15 U/L (ref 15–41)
Albumin: 3.7 g/dL (ref 3.5–5.0)
Alkaline Phosphatase: 71 U/L (ref 38–126)
Anion gap: 6 (ref 5–15)
BUN: 9 mg/dL (ref 6–20)
CO2: 27 mmol/L (ref 22–32)
Calcium: 9 mg/dL (ref 8.9–10.3)
Chloride: 106 mmol/L (ref 98–111)
Creatinine: 0.55 mg/dL (ref 0.44–1.00)
GFR, Estimated: >60 mL/min (ref >60)
Glucose, Bld: 82 mg/dL (ref 70–99)
Potassium: 3.5 mmol/L (ref 3.5–5.1)
Sodium: 139 mmol/L (ref 135–145)
Total Bilirubin: 0.6 mg/dL (ref 0.3–1.2)
Total Protein: 7 g/dL (ref 6.5–8.1)

## 2022-03-23 LAB — CBC WITH DIFFERENTIAL (CANCER CENTER ONLY)
Abs Immature Granulocytes: 0.02 10*3/uL (ref 0.00–0.07)
Basophils Absolute: 0 10*3/uL (ref 0.0–0.1)
Basophils Relative: 1 %
Eosinophils Absolute: 0.2 10*3/uL (ref 0.0–0.5)
Eosinophils Relative: 3 %
HCT: 30.8 % — ABNORMAL LOW (ref 36.0–46.0)
Hemoglobin: 9.3 g/dL — ABNORMAL LOW (ref 12.0–15.0)
Immature Granulocytes: 0 %
Lymphocytes Relative: 22 %
Lymphs Abs: 1.4 10*3/uL (ref 0.7–4.0)
MCH: 26.6 pg (ref 26.0–34.0)
MCHC: 30.2 g/dL (ref 30.0–36.0)
MCV: 88 fL (ref 80.0–100.0)
Monocytes Absolute: 0.8 10*3/uL (ref 0.1–1.0)
Monocytes Relative: 13 %
Neutro Abs: 3.7 10*3/uL (ref 1.7–7.7)
Neutrophils Relative %: 61 %
Platelet Count: 381 10*3/uL (ref 150–400)
RBC: 3.5 MIL/uL — ABNORMAL LOW (ref 3.87–5.11)
RDW: 17.8 % — ABNORMAL HIGH (ref 11.5–15.5)
WBC Count: 6.2 10*3/uL (ref 4.0–10.5)
nRBC: 0 % (ref 0.0–0.2)

## 2022-03-23 MED ORDER — SODIUM CHLORIDE 0.9% FLUSH
10.0000 mL | Freq: Once | INTRAVENOUS | Status: AC
  Administered 2022-03-23: 10 mL

## 2022-03-23 MED ORDER — HEPARIN SOD (PORK) LOCK FLUSH 100 UNIT/ML IV SOLN
500.0000 [IU] | Freq: Once | INTRAVENOUS | Status: AC
  Administered 2022-03-23: 500 [IU]

## 2022-03-23 MED ORDER — FUROSEMIDE 20 MG PO TABS: 20.0000 mg | ORAL_TABLET | Freq: Every day | ORAL | 0 refills | Status: DC | PRN

## 2022-03-23 NOTE — Assessment & Plan Note (Signed)
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 ?CT CAP 10/24/2021: Left breast cancer and left axillary lymph nodes, no evidence of distant metastatic disease. ?Bone scan 10/23/2021: No evidence of bony metastases. ?Left mandible activity benign ?? ?Treatment plan: ?1.??Neoadjuvant chemotherapy with dose dense Adriamycin and Cytoxan followed by Taxol ?2.?modified radical mastectomy ?3.??Adjuvant radiation ?4.??Adjuvant antiestrogen therapy with ovarian function suppression plus + AI + Verzinio ?------------------------------------------------------------------------------------------------------------------------------------------- ?Current treatment:?Completed 4 cycles of?dose dense Adriamycin and Cytoxan, Today is cycle?12?Taxol ?Echocardiogram 10/27/2021: EF 60 to 65% ?? ?Chemo toxicities: ?1. Fatigue? ?2. monitoring closely for peripheral neuropathy ?3.  Leg swelling: Likely related to Taxol however will check echo since she did receive Adriamycin.  And gave her some Lasix to take for couple days as needed and we will see her back next week to repeat a BMP.  So long as everything is normal we will see her back in clinic after her surgery. ? ? ?Return to clinic after surgery to discuss final pathology results. ?
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
## 2022-03-23 NOTE — Telephone Encounter (Signed)
Received call from pt with complaint of bilateral lower extremity and upper extremity swelling x2 weeks.  Pt states swelling is worse at the end of the day after working.  Pt denies redness or warmth to extremities.  Pt denies shortness of breath at this time.  Per provider, pt needing to be seen in [facility] today with lab work for further evaluation and tx. Appt scheduled and pt verbalized understanding of appt date and time.  ?

## 2022-03-26 ENCOUNTER — Telehealth: Payer: Self-pay | Admitting: Adult Health

## 2022-03-26 ENCOUNTER — Ambulatory Visit (HOSPITAL_COMMUNITY)
Admission: RE | Admit: 2022-03-26 | Discharge: 2022-03-26 | Disposition: A | Discharge status: Home / Self Care | Payer: Medicaid Other | Source: Ambulatory Visit | Attending: Adult Health | Admitting: Adult Health

## 2022-03-26 DIAGNOSIS — Z0189 Encounter for other specified special examinations: Secondary | ICD-10-CM | POA: Diagnosis not present

## 2022-03-26 DIAGNOSIS — Z170 Estrogen receptor positive status [ER+]: Secondary | ICD-10-CM | POA: Diagnosis not present

## 2022-03-26 DIAGNOSIS — Z01818 Encounter for other preprocedural examination: Secondary | ICD-10-CM | POA: Insufficient documentation

## 2022-03-26 DIAGNOSIS — C50412 Malignant neoplasm of upper-outer quadrant of left female breast: Secondary | ICD-10-CM | POA: Insufficient documentation

## 2022-03-26 LAB — ECHOCARDIOGRAM COMPLETE
AR max vel: 2.67 cm2
AV Peak grad: 8.8 mmHg
Ao pk vel: 1.49 m/s
Area-P 1/2: 3.46 cm2
S' Lateral: 3.8 cm
Single Plane A4C EF: 53.7 %

## 2022-03-26 NOTE — Telephone Encounter (Signed)
Per 4/7 los.  Spoke to pt about appointment.  Pt confirmed appointment  ?

## 2022-03-27 ENCOUNTER — Other Ambulatory Visit: Payer: Self-pay | Admitting: *Deleted

## 2022-03-27 DIAGNOSIS — R931 Abnormal findings on diagnostic imaging of heart and coronary circulation: Secondary | ICD-10-CM

## 2022-03-27 DIAGNOSIS — Z170 Estrogen receptor positive status [ER+]: Secondary | ICD-10-CM

## 2022-03-27 NOTE — Progress Notes (Signed)
Per MD due to pt decreased, pt needing to be evaluated by Dr. Haroldine Laws.  Referral placed, pt notified.  ?

## 2022-03-28 ENCOUNTER — Telehealth (HOSPITAL_COMMUNITY): Payer: Self-pay | Admitting: Vascular Surgery

## 2022-03-28 ENCOUNTER — Encounter: Payer: Self-pay | Admitting: *Deleted

## 2022-03-28 NOTE — Progress Notes (Signed)
Surgical Instructions ? ? ? Your procedure is scheduled on Wednesday, April 19. ? Report to [facility] Main Entrance "A" at 9:45 A.M., then check in with the Admitting office. ? Call this number if you have problems the morning of surgery: ? [phone] ? ? If you have any questions prior to your surgery date call [phone]: Open Monday-Friday 8am-4pm ? ? ? Remember: ? Do not eat after midnight the night before your surgery ? ?You may drink clear liquids until 8:45 AM the morning of your surgery.   ?Clear liquids allowed are: Water, Non-Citrus Juices (without pulp), Carbonated Beverages, Clear Tea, Black Coffee ONLY (NO MILK, CREAM OR POWDERED CREAMER of any kind), and Gatorade ?  ? Take these medicines the morning of surgery with A SIP OF WATER:  ? ?albuterol (PROVENTIL) (2.5 MG/3ML) 0.083% nebulizer solution if needed ?albuterol (VENTOLIN HFA) 108 (90 Base) MCG/ACT inhaler if needed ? ?Please bring all inhalers with you the day of surgery.  ? ? ?As of today, STOP taking any Aspirin (unless otherwise instructed by your surgeon) Aleve, Naproxen, Ibuprofen, Motrin, Advil, Goody's, BC's, all herbal medications, fish oil, and all vitamins. ? ?         ?Do not wear jewelry or makeup ?Do not wear lotions, powders, perfumes/colognes, or deodorant. ?Do not shave 48 hours prior to surgery.  Men may shave face and neck. ?Do not bring valuables to the hospital. ?Do not wear nail polish, gel polish, artificial nails, or any other type of covering on natural nails (fingers and toes) ?If you have artificial nails or gel coating that need to be removed by a nail salon, please have this removed prior to surgery. Artificial nails or gel coating may interfere with anesthesia's ability to adequately monitor your vital signs. ? ?[facility] is not responsible for any belongings or valuables. .  ? ?Do NOT Smoke (Tobacco/Vaping)  24 hours prior to your procedure ? ?If you use a CPAP at night, you may bring your mask for your  overnight stay. ?  ?Contacts, glasses, hearing aids, dentures or partials may not be worn into surgery, please bring cases for these belongings ?  ?For patients admitted to the hospital, discharge time will be determined by your treatment team. ?  ?Patients discharged the day of surgery will not be allowed to drive home, and someone needs to stay with them for 24 hours. ? ? ?SURGICAL WAITING ROOM VISITATION ?Patients having surgery or a procedure in a hospital may have two support people. ?Children under the age of 16 must have an adult with them who is not the patient. ?They may stay in the waiting area during the procedure and may switch out with other visitors. If the patient needs to stay at the hospital during part of their recovery, the visitor guidelines for inpatient rooms apply. ? ?Please refer to the [facility] website for the visitor guidelines for Inpatients (after your surgery is over and you are in a regular room).  ? ? ? ? ? ?Special instructions:   ? ?Oral Hygiene is also important to reduce your risk of infection.  Remember - BRUSH YOUR TEETH THE MORNING OF SURGERY WITH YOUR REGULAR TOOTHPASTE ? ? ?[facility]- Preparing For Surgery ? ?Before surgery, you can play an important role. Because skin is not sterile, your skin needs to be as free of germs as possible. You can reduce the number of germs on your skin by washing with CHG (chlorahexidine gluconate) Soap before surgery.  CHG  is an antiseptic cleaner which kills germs and bonds with the skin to continue killing germs even after washing.   ? ? ?Please do not use if you have an allergy to CHG or antibacterial soaps. If your skin becomes reddened/irritated stop using the CHG.  ?Do not shave (including legs and underarms) for at least 48 hours prior to first CHG shower. It is OK to shave your face. ? ?Please follow these instructions carefully. ?  ? ? Shower the NIGHT BEFORE SURGERY and the MORNING OF SURGERY with CHG Soap.  ? If you chose to wash  your hair, wash your hair first as usual with your normal shampoo. After you shampoo, rinse your hair and body thoroughly to remove the shampoo.  Then [employer] and genitals (private parts) with your normal soap and rinse thoroughly to remove soap. ? ?After that Use CHG Soap as you would any other liquid soap. You can apply CHG directly to the skin and wash gently with a scrungie or a clean washcloth.  ? ?Apply the CHG Soap to your body ONLY FROM THE NECK DOWN.  Do not use on open wounds or open sores. Avoid contact with your eyes, ears, mouth and genitals (private parts). Wash Face and genitals (private parts)  with your normal soap.  ? ?Wash thoroughly, paying special attention to the area where your surgery will be performed. ? ?Thoroughly rinse your body with warm water from the neck down. ? ?DO NOT shower/wash with your normal soap after using and rinsing off the CHG Soap. ? ?Pat yourself dry with a CLEAN TOWEL. ? ?Wear CLEAN PAJAMAS to bed the night before surgery ? ?Place CLEAN SHEETS on your bed the night before your surgery ? ?DO NOT SLEEP WITH PETS. ? ? ?Day of Surgery: ? ?Take a shower with CHG soap. ?Wear Clean/Comfortable clothing the morning of surgery ?Do not apply any deodorants/lotions.   ?Remember to brush your teeth WITH YOUR REGULAR TOOTHPASTE. ? ? ? ?If you received a COVID test during your pre-op visit  it is requested that you wear a mask when out in public, stay away from anyone that may not be feeling well and notify your surgeon if you develop symptoms. If you have been in contact with anyone that has tested positive in the last 10 days please notify you surgeon. ? ?  ?Please read over the following fact sheets that you were given.  ? ?

## 2022-03-28 NOTE — Telephone Encounter (Signed)
Left pt message to make new brst cancer appt , asked pt o call back to confirm 4/13 @ 1140 w/ db ?

## 2022-03-29 ENCOUNTER — Encounter (HOSPITAL_COMMUNITY): Payer: Self-pay | Admitting: Internal Medicine

## 2022-03-29 ENCOUNTER — Encounter (HOSPITAL_COMMUNITY): Payer: Self-pay

## 2022-03-29 ENCOUNTER — Encounter (HOSPITAL_COMMUNITY)
Admission: RE | Admit: 2022-03-29 | Discharge: 2022-03-29 | Disposition: A | Discharge status: Home / Self Care | Payer: Medicaid Other | Source: Ambulatory Visit | Attending: Surgery | Admitting: Surgery

## 2022-03-29 ENCOUNTER — Other Ambulatory Visit: Payer: Self-pay

## 2022-03-29 ENCOUNTER — Ambulatory Visit (HOSPITAL_COMMUNITY)
Admission: RE | Admit: 2022-03-29 | Discharge: 2022-03-29 | Disposition: A | Discharge status: Home / Self Care | Payer: Medicaid Other | Source: Ambulatory Visit | Attending: Internal Medicine | Admitting: Internal Medicine

## 2022-03-29 VITALS — BP 120/74 | HR 81 | Wt 244.0 lb

## 2022-03-29 DIAGNOSIS — Z01818 Encounter for other preprocedural examination: Secondary | ICD-10-CM | POA: Insufficient documentation

## 2022-03-29 DIAGNOSIS — R600 Localized edema: Secondary | ICD-10-CM | POA: Diagnosis not present

## 2022-03-29 DIAGNOSIS — Z9221 Personal history of antineoplastic chemotherapy: Secondary | ICD-10-CM | POA: Diagnosis not present

## 2022-03-29 DIAGNOSIS — C7951 Secondary malignant neoplasm of bone: Secondary | ICD-10-CM | POA: Diagnosis not present

## 2022-03-29 DIAGNOSIS — C50412 Malignant neoplasm of upper-outer quadrant of left female breast: Secondary | ICD-10-CM

## 2022-03-29 DIAGNOSIS — I509 Heart failure, unspecified: Secondary | ICD-10-CM | POA: Diagnosis present

## 2022-03-29 DIAGNOSIS — C50912 Malignant neoplasm of unspecified site of left female breast: Secondary | ICD-10-CM | POA: Insufficient documentation

## 2022-03-29 DIAGNOSIS — C779 Secondary and unspecified malignant neoplasm of lymph node, unspecified: Secondary | ICD-10-CM | POA: Diagnosis not present

## 2022-03-29 DIAGNOSIS — Z170 Estrogen receptor positive status [ER+]: Secondary | ICD-10-CM | POA: Insufficient documentation
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
## 2022-03-29 NOTE — Progress Notes (Signed)
PCP - Dr. Asencion Noble ?Cardiologist - Dr. Haroldine Laws  ? ?PPM/ICD - n/a ? ?Chest x-ray - n/a ?EKG - 03/29/22 ?Stress Test - denies ?ECHO - 03/26/22 ?Cardiac Cath - denies ? ?Sleep Study - denies ?CPAP - denies ? ?Blood Thinner Instructions: n/a ?Aspirin Instructions: n/a ? ?ERAS Protcol -Clear liquids until 0845 DOS ?PRE-SURGERY Ensure or G2- none ordered ? ?COVID TEST- n/a ? ?Anesthesia review: Yes, new cardiologist following as of 03/29/22- Dr. Haroldine Laws.  ? ?Patient denies shortness of breath, fever, cough and chest pain at PAT appointment ? ? ?All instructions explained to the patient, with a verbal understanding of the material. Patient agrees to go over the instructions while at home for a better understanding. Patient also instructed to self quarantine after being tested for COVID-19. The opportunity to ask questions was provided. ? ? ?

## 2022-03-29 NOTE — Patient Instructions (Signed)
No medication changes ? ?Your physician has requested that you have an echocardiogram. Echocardiography is a painless test that uses sound waves to create images of your heart. It provides your doctor with information about the size and shape of your heart and how well your heart?s chambers and valves are working. This procedure takes approximately one hour. There are no restrictions for this procedure. ? ?Your physician recommends that you schedule a follow-up appointment on July 17,2023 ? ?If you have any questions or concerns before your next appointment please send [place] a message through [place] or call our office at [phone].   ? ?TO LEAVE A MESSAGE FOR THE NURSE SELECT OPTION 2, PLEASE LEAVE A MESSAGE INCLUDING: ?YOUR NAME ?DATE OF BIRTH ?CALL BACK NUMBER ?REASON FOR CALL**this is important as we prioritize the call backs ? ?YOU WILL RECEIVE A CALL BACK THE SAME DAY AS LONG AS YOU CALL BEFORE 4:00 PM ? ?At the [facility], you and your health needs are our priority. As part of our continuing mission to provide you with exceptional heart care, we have created designated Provider Care Teams. These Care Teams include your primary Cardiologist (physician) and Advanced Practice Providers (APPs- Physician Assistants and Nurse Practitioners) who all work together to provide you with the care you need, when you need it.  ? ?You may see any of the following providers on your designated Care Team at your next follow up: ?Dr Glori Bickers ?Dr Loralie Champagne ?Darrick Grinder, NP ?Lyda Jester, PA ?Jessica Milford,NP ?Marlyce Huge, PA ?Audry Riles, PharmD ? ? ?Please be sure to bring in all your medications bottles to every appointment.  ? ? ?

## 2022-04-02 ENCOUNTER — Inpatient Hospital Stay: Payer: Medicaid Other

## 2022-04-02 ENCOUNTER — Other Ambulatory Visit: Payer: Self-pay

## 2022-04-02 DIAGNOSIS — C50412 Malignant neoplasm of upper-outer quadrant of left female breast: Secondary | ICD-10-CM | POA: Diagnosis not present

## 2022-04-02 DIAGNOSIS — Z95828 Presence of other vascular implants and grafts: Secondary | ICD-10-CM

## 2022-04-02 LAB — CBC WITH DIFFERENTIAL (CANCER CENTER ONLY)
Abs Immature Granulocytes: 0.02 10*3/uL (ref 0.00–0.07)
Basophils Absolute: 0 10*3/uL (ref 0.0–0.1)
Basophils Relative: 0 %
Eosinophils Absolute: 0.2 10*3/uL (ref 0.0–0.5)
Eosinophils Relative: 3 %
HCT: 30.8 % — ABNORMAL LOW (ref 36.0–46.0)
Hemoglobin: 9.6 g/dL — ABNORMAL LOW (ref 12.0–15.0)
Immature Granulocytes: 0 %
Lymphocytes Relative: 18 %
Lymphs Abs: 1.3 10*3/uL (ref 0.7–4.0)
MCH: 26.7 pg (ref 26.0–34.0)
MCHC: 31.2 g/dL (ref 30.0–36.0)
MCV: 85.6 fL (ref 80.0–100.0)
Monocytes Absolute: 0.6 10*3/uL (ref 0.1–1.0)
Monocytes Relative: 9 %
Neutro Abs: 4.8 10*3/uL (ref 1.7–7.7)
Neutrophils Relative %: 70 %
Platelet Count: 368 10*3/uL (ref 150–400)
RBC: 3.6 MIL/uL — ABNORMAL LOW (ref 3.87–5.11)
RDW: 18.4 % — ABNORMAL HIGH (ref 11.5–15.5)
WBC Count: 6.9 10*3/uL (ref 4.0–10.5)
nRBC: 0 % (ref 0.0–0.2)

## 2022-04-02 LAB — CMP (CANCER CENTER ONLY)
ALT: 10 U/L (ref 0–44)
AST: 14 U/L — ABNORMAL LOW (ref 15–41)
Albumin: 3.8 g/dL (ref 3.5–5.0)
Alkaline Phosphatase: 67 U/L (ref 38–126)
Anion gap: 5 (ref 5–15)
BUN: 11 mg/dL (ref 6–20)
CO2: 28 mmol/L (ref 22–32)
Calcium: 9 mg/dL (ref 8.9–10.3)
Chloride: 105 mmol/L (ref 98–111)
Creatinine: 0.53 mg/dL (ref 0.44–1.00)
GFR, Estimated: >60 mL/min (ref >60)
Glucose, Bld: 90 mg/dL (ref 70–99)
Potassium: 3.8 mmol/L (ref 3.5–5.1)
Sodium: 138 mmol/L (ref 135–145)
Total Bilirubin: 0.4 mg/dL (ref 0.3–1.2)
Total Protein: 7.1 g/dL (ref 6.5–8.1)

## 2022-04-02 MED ORDER — SODIUM CHLORIDE 0.9% FLUSH
10.0000 mL | Freq: Once | INTRAVENOUS | Status: AC
  Administered 2022-04-02: 10 mL

## 2022-04-02 MED ORDER — HEPARIN SOD (PORK) LOCK FLUSH 100 UNIT/ML IV SOLN
500.0000 [IU] | Freq: Once | INTRAVENOUS | Status: AC
  Administered 2022-04-02: 500 [IU]

## 2022-04-02 NOTE — Progress Notes (Signed)
? ?Patient Care Team: ?Alicia Stain, MD as PCP - General (Pulmonary Disease) ?Rockwell Germany, RN as Oncology Nurse Navigator ?Mauro Kaufmann, RN as Oncology Nurse Navigator ?Nicholas Lose, MD as Consulting Physician (Hematology and Oncology) ?Donnie Mesa, MD as Consulting Physician (General Surgery) ? ?DIAGNOSIS:  ?Encounter Diagnosis  ?Name Primary?  ? Malignant neoplasm of upper-outer quadrant of left breast in female, estrogen receptor positive ([place])   ? ? ?SUMMARY OF ONCOLOGIC HISTORY: ?Oncology History  ?Malignant neoplasm of upper-outer quadrant of left breast in female, estrogen receptor positive ([place])  ?10/04/2021 Initial Diagnosis  ? 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  ?10/11/2021 Cancer Staging  ? Staging form: Breast, AJCC 8th Edition ?- Clinical stage from 10/11/2021: Stage IV (cT3, cN1, cM1, G3, ER+, PR-, HER2-) - Signed by Nicholas Lose, MD on 10/11/2021 ?Stage prefix: Initial diagnosis ?Histologic grading system: 3 grade system ? ?  ?10/20/2021 Genetic Testing  ? Negative hereditary cancer genetic testing: no pathogenic variants detected in [facility] Breast Cancer STAT Panel or Multi-Cancer +RNA Panel.  Variants of uncertain significance detected in POT1 at c.476T>C (p.Met159Thr) and PRKAR1A at c.27T>G (p.Ser9Arg).  The report dates are October 19, 2021 and October 29, 2021, respectively.  ? ?The Multi-Cancer + RNA Panel offered by [facility] includes sequencing and/or deletion/duplication analysis of the following 84 genes:  AIP*, ALK, APC*, ATM*, AXIN2*, BAP1*, BARD1*, BLM*, BMPR1A*, BRCA1*, BRCA2*, BRIP1*, CASR, CDC73*, CDH1*, CDK4, CDKN1B*, CDKN1C*, CDKN2A, CEBPA, CHEK2*, CTNNA1*, DICER1*, DIS3L2*,  EGFR, EPCAM, FH*, FLCN*, GATA2*, GPC3, GREM1, HOXB13, HRAS, KIT, MAX*, MEN1*, MET, MITF, MLH1*, MSH2*, MSH3*, MSH6*, MUTYH*, NBN*, NF1*, NF2*, NTHL1*, PALB2*, PDGFRA, PHOX2B, PMS2*, POLD1*, POLE*, POT1*, PRKAR1A*, PTCH1*, PTEN*, RAD50*, RAD51C*, RAD51D*, RB1*, RECQL4, RET, RUNX1*, SDHA*, SDHAF2*, SDHB*, SDHC*, SDHD*, SMAD4*, SMARCA4*, SMARCB1*, SMARCE1*, STK11*, SUFU*, TERC, TERT, TMEM127*, Tp53*, TSC1*, TSC2*, VHL*, WRN*, and WT1.  RNA analysis is performed for * genes. ?  ?11/01/2021 - 03/12/2022 Chemotherapy  ? Patient is on Treatment Plan : BREAST ADJUVANT DOSE DENSE AC q14d / PACLitaxel q7d  ? ?  ?  ?04/04/2022 Surgery  ? Bilateral mastectomies ?Right mastectomy: Benign ?Left mastectomy: Negative for residual cancer (pathologic complete response) ?  ? ? ?CHIEF COMPLIANT: discuss final pathology results. ?  ? ?INTERVAL HISTORY: Alicia Morton is a 41 y.o. with above-mentioned history of breast cancer currently on chemotherapy with Taxol. She presents to the clinic today to discuss final pathology results. She complains of feet swelling. She states that they are also numb. She also states that the numbness is in her hands by the end of the da it goes away. She complains of her arm sticking from the surgery. ? ? ?ALLERGIES:  has No Known Allergies. ? ?MEDICATIONS:  ?Current Outpatient Medications  ?Medication Sig Dispense Refill  ? albuterol (PROVENTIL) (2.5 MG/3ML) 0.083% nebulizer solution Take 3 mLs (2.5 mg total) by nebulization every 6 (six) hours as needed for wheezing or shortness of breath. 150 mL 1  ? albuterol (VENTOLIN HFA) 108 (90 Base) MCG/ACT inhaler Inhale 1-2 puffs into the lungs every 6 (six) hours as needed for wheezing or shortness of breath. 18 g 0  ? furosemide (LASIX) 20 MG tablet Take 1 tablet (20 mg  total) by mouth daily as needed. 15 tablet 0  ? methocarbamol (ROBAXIN) 500 MG tablet Take 1 tablet (500 mg total) by mouth every 6 (six) hours as needed for muscle spasms. 20 tablet 0  ?  oxyCODONE (OXY IR/ROXICODONE) 5 MG immediate release tablet Take 1 tablet (5 mg total) by mouth every 6 (six) hours as needed for moderate pain. 30 tablet 0  ? ?No current facility-administered medications for this visit.  ? ?Facility-Administered Medications Ordered in Other Visits  ?Medication Dose Route Frequency Provider Last Rate Last Admin  ? sodium chloride flush (NS) 0.9 % injection 10 mL  10 mL Intracatheter Once Nicholas Lose, MD      ? ? ?PHYSICAL EXAMINATION: ?ECOG PERFORMANCE STATUS: 1 - Symptomatic but completely ambulatory ? ?Vitals:  ? 04/16/22 1003  ?BP: (!) 131/56  ?Pulse: 90  ?Resp: 18  ?Temp: 97.7 ?F (36.5 ?C)  ?SpO2: 100%  ? ?Filed Weights  ? 04/16/22 1003  ?Weight: 236 lb (107 kg)  ? ?  ? ?LABORATORY DATA:  ?I have reviewed the data as listed ? ?  Latest Ref Rng & Units 04/05/2022  ?  2:17 AM 04/02/2022  ?  9:48 AM 03/23/2022  ?  2:11 PM  ?CMP  ?Glucose 70 - 99 mg/dL 110   90   82    ?BUN 6 - 20 mg/dL $Remove'10   11   9    'xJKFWzh$ ?Creatinine 0.44 - 1.00 mg/dL 0.60   0.53   0.55    ?Sodium 135 - 145 mmol/L 139   138   139    ?Potassium 3.5 - 5.1 mmol/L 3.9   3.8   3.5    ?Chloride 98 - 111 mmol/L 107   105   106    ?CO2 22 - 32 mmol/L $RemoveB'25   28   27    'bxNKscuj$ ?Calcium 8.9 - 10.3 mg/dL 8.8   9.0   9.0    ?Total Protein 6.5 - 8.1 g/dL  7.1   7.0    ?Total Bilirubin 0.3 - 1.2 mg/dL  0.4   0.6    ?Alkaline Phos 38 - 126 U/L  67   71    ?AST 15 - 41 U/L  14   15    ?ALT 0 - 44 U/L  10   11    ? ? ?Lab Results  ?Component Value Date  ? WBC 11.1 (H) 04/05/2022  ? HGB 9.2 (L) 04/05/2022  ? HCT 29.6 (L) 04/05/2022  ? MCV 85.8 04/05/2022  ? PLT 372 04/05/2022  ? NEUTROABS 4.8 04/02/2022  ? ? ?ASSESSMENT & PLAN:  ?Malignant neoplasm of upper-outer quadrant of left breast in female, estrogen receptor positive ([place]) ?10/04/2021: Palpable left breast mass and left breast swelling for 2 months, mammogram revealed 9 cm abnormality at 2 o'clock position plus another mass at 8:00 measuring 1.3 cm: Biopsy of both revealed grade 3 IDC ER  95%, PR 0%, HER2 negative, Ki-67 40%, 5 abnormal lymph nodes: Biopsy of 1 was positive (rib metastases seen on mammogram) ?  ?CT CAP 10/24/2021: Left breast cancer and left axillary lymph nodes, no evidence of distant metastatic disease. ?Bone scan 10/23/2021: No evidence of bony metastases.  Left mandible activity benign ?  ?Treatment plan: ?1.  Neoadjuvant chemotherapy with dose dense Adriamycin and Cytoxan followed by Taxol completed 03/23/2022 ?2. 04/04/2022:Bilateral mastectomies ?Right mastectomy: Benign ?Left mastectomy: Negative for residual cancer (pathologic complete response) Dr. Georgette Dover removed a lot of tissue from the axilla but  there were no viable lymph nodes. ?3.  Adjuvant radiation ?4.  Adjuvant antiestrogen therapy with ovarian function suppression plus + AI + Verzinio (we will discuss this option versus tamoxifen) ?------------------------------------------------------------------------------------------------------------------------------------------- ?Patient had a complete pathologic response to chemotherapy.  This indicates a very favorable prognosis. ?Return to clinic after radiation to start antiestrogen therapy ? ?Patient still has drains in place and is uncomfortable as a result of them. ? ?Leg swelling: Currently on Lasix. 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 ?Return to clinic at the end of radiation. ? ? ? ?No orders of the defined types were placed in this encounter. ? ?The patient has a good understanding of the overall plan. she agrees with it. she will call with any problems that may develop before the next visit here. ?Total time spent: 30 mins including face to face time and time spent for planning, charting and co-ordination of care ? ? Harriette Ohara, MD ?04/16/22 ? ? ? I Gardiner Coins am scribing for Dr. Lindi Adie ? ?I have reviewed the above documentation for accuracy and completeness, and I agree with the above. ?  ?

## 2022-04-04 ENCOUNTER — Ambulatory Visit (HOSPITAL_COMMUNITY)
Admission: RE | Admit: 2022-04-04 | Discharge: 2022-04-05 | Disposition: A | Discharge status: Home / Self Care | Payer: Medicaid Other | Source: Ambulatory Visit | Attending: Surgery | Admitting: Surgery

## 2022-04-04 ENCOUNTER — Encounter (HOSPITAL_COMMUNITY)
Admission: RE | Disposition: A | Discharge status: Home / Self Care | Payer: Self-pay | Source: Ambulatory Visit | Attending: Surgery

## 2022-04-04 ENCOUNTER — Other Ambulatory Visit: Payer: Self-pay

## 2022-04-04 ENCOUNTER — Ambulatory Visit (HOSPITAL_BASED_OUTPATIENT_CLINIC_OR_DEPARTMENT_OTHER): Payer: Medicaid Other | Admitting: Physician Assistant

## 2022-04-04 ENCOUNTER — Ambulatory Visit (HOSPITAL_COMMUNITY): Payer: Medicaid Other | Admitting: Physician Assistant

## 2022-04-04 ENCOUNTER — Ambulatory Visit (HOSPITAL_COMMUNITY): Payer: Medicaid Other

## 2022-04-04 ENCOUNTER — Encounter (HOSPITAL_COMMUNITY): Payer: Self-pay | Admitting: Surgery

## 2022-04-04 DIAGNOSIS — C50912 Malignant neoplasm of unspecified site of left female breast: Secondary | ICD-10-CM

## 2022-04-04 DIAGNOSIS — J4520 Mild intermittent asthma, uncomplicated: Secondary | ICD-10-CM | POA: Insufficient documentation

## 2022-04-04 DIAGNOSIS — D649 Anemia, unspecified: Secondary | ICD-10-CM | POA: Insufficient documentation

## 2022-04-04 DIAGNOSIS — C50812 Malignant neoplasm of overlapping sites of left female breast: Secondary | ICD-10-CM | POA: Insufficient documentation

## 2022-04-04 DIAGNOSIS — D759 Disease of blood and blood-forming organs, unspecified: Secondary | ICD-10-CM | POA: Insufficient documentation

## 2022-04-04 DIAGNOSIS — C7989 Secondary malignant neoplasm of other specified sites: Secondary | ICD-10-CM | POA: Diagnosis not present

## 2022-04-04 DIAGNOSIS — Z170 Estrogen receptor positive status [ER+]: Secondary | ICD-10-CM | POA: Diagnosis not present

## 2022-04-04 DIAGNOSIS — L910 Hypertrophic scar: Secondary | ICD-10-CM | POA: Diagnosis not present

## 2022-04-04 DIAGNOSIS — Z9221 Personal history of antineoplastic chemotherapy: Secondary | ICD-10-CM | POA: Diagnosis not present

## 2022-04-04 DIAGNOSIS — Z452 Encounter for adjustment and management of vascular access device: Secondary | ICD-10-CM | POA: Insufficient documentation

## 2022-04-04 DIAGNOSIS — D630 Anemia in neoplastic disease: Secondary | ICD-10-CM | POA: Diagnosis not present

## 2022-04-04 DIAGNOSIS — C773 Secondary and unspecified malignant neoplasm of axilla and upper limb lymph nodes: Secondary | ICD-10-CM | POA: Insufficient documentation

## 2022-04-04 HISTORY — PX: SCAR REVISION: SHX5285

## 2022-04-04 HISTORY — PX: SIMPLE MASTECTOMY WITH AXILLARY SENTINEL NODE BIOPSY: SHX6098

## 2022-04-04 HISTORY — PX: PORT-A-CATH REMOVAL: SHX5289

## 2022-04-04 HISTORY — PX: MODIFIED MASTECTOMY: SHX5268

## 2022-04-04 LAB — POCT PREGNANCY, URINE: Preg Test, Ur: NEGATIVE

## 2022-04-04 SURGERY — MODIFIED MASTECTOMY
Anesthesia: General | Site: Breast | Laterality: Right

## 2022-04-04 MED ORDER — MIDAZOLAM HCL 2 MG/2ML IJ SOLN
INTRAMUSCULAR | Status: AC
  Filled 2022-04-04: qty 2

## 2022-04-04 MED ORDER — ROCURONIUM BROMIDE 10 MG/ML (PF) SYRINGE
PREFILLED_SYRINGE | INTRAVENOUS | Status: DC | PRN
  Administered 2022-04-04: 30 mg via INTRAVENOUS
  Administered 2022-04-04: 40 mg via INTRAVENOUS
  Administered 2022-04-04: 60 mg via INTRAVENOUS

## 2022-04-04 MED ORDER — PROPOFOL 10 MG/ML IV BOLUS
INTRAVENOUS | Status: DC | PRN
Start: 2022-04-04 — End: 2022-04-04
  Administered 2022-04-04: 50 mg via INTRAVENOUS
  Administered 2022-04-04: 200 mg via INTRAVENOUS
  Administered 2022-04-04: 30 mg via INTRAVENOUS

## 2022-04-04 MED ORDER — HYDROMORPHONE HCL 1 MG/ML IJ SOLN: 1.0000 mg | INTRAMUSCULAR | Status: DC | PRN

## 2022-04-04 MED ORDER — CEFAZOLIN SODIUM-DEXTROSE 2-4 GM/100ML-% IV SOLN
2.0000 g | Freq: Three times a day (TID) | INTRAVENOUS | Status: AC
  Administered 2022-04-04: 2 g via INTRAVENOUS
  Filled 2022-04-04: qty 100

## 2022-04-04 MED ORDER — ROCURONIUM BROMIDE 10 MG/ML (PF) SYRINGE
PREFILLED_SYRINGE | INTRAVENOUS | Status: AC
  Filled 2022-04-04: qty 10

## 2022-04-04 MED ORDER — CHLORHEXIDINE GLUCONATE CLOTH 2 % EX PADS: 6.0000 | MEDICATED_PAD | Freq: Once | CUTANEOUS | Status: DC

## 2022-04-04 MED ORDER — FENTANYL CITRATE (PF) 100 MCG/2ML IJ SOLN: 50.0000 ug | Freq: Once | INTRAMUSCULAR | Status: AC

## 2022-04-04 MED ORDER — MIDAZOLAM HCL 2 MG/2ML IJ SOLN
INTRAMUSCULAR | Status: AC
  Administered 2022-04-04: 2 mg via INTRAVENOUS
  Filled 2022-04-04: qty 2

## 2022-04-04 MED ORDER — METHOCARBAMOL 500 MG PO TABS
500.0000 mg | ORAL_TABLET | Freq: Four times a day (QID) | ORAL | Status: DC | PRN
  Administered 2022-04-04: 500 mg via ORAL
  Filled 2022-04-04: qty 1

## 2022-04-04 MED ORDER — LIDOCAINE 2% (20 MG/ML) 5 ML SYRINGE
INTRAMUSCULAR | Status: DC | PRN
  Administered 2022-04-04: 40 mg via INTRAVENOUS

## 2022-04-04 MED ORDER — ACETAMINOPHEN 500 MG PO TABS
1000.0000 mg | ORAL_TABLET | ORAL | Status: AC
  Administered 2022-04-04: 1000 mg via ORAL
  Filled 2022-04-04: qty 2

## 2022-04-04 MED ORDER — LACTATED RINGERS IV SOLN: INTRAVENOUS | Status: DC

## 2022-04-04 MED ORDER — ACETAMINOPHEN 650 MG RE SUPP: 650.0000 mg | Freq: Four times a day (QID) | RECTAL | Status: DC | PRN

## 2022-04-04 MED ORDER — ENOXAPARIN SODIUM 40 MG/0.4ML IJ SOSY
40.0000 mg | PREFILLED_SYRINGE | INTRAMUSCULAR | Status: DC
  Administered 2022-04-05: 40 mg via SUBCUTANEOUS
  Filled 2022-04-04: qty 0.4

## 2022-04-04 MED ORDER — CEFAZOLIN SODIUM-DEXTROSE 2-4 GM/100ML-% IV SOLN
2.0000 g | INTRAVENOUS | Status: AC
  Administered 2022-04-04: 2 g via INTRAVENOUS
  Filled 2022-04-04: qty 100

## 2022-04-04 MED ORDER — SUGAMMADEX SODIUM 200 MG/2ML IV SOLN
INTRAVENOUS | Status: DC | PRN
  Administered 2022-04-04: 200 mg via INTRAVENOUS

## 2022-04-04 MED ORDER — HYDRALAZINE HCL 20 MG/ML IJ SOLN: 10.0000 mg | INTRAMUSCULAR | Status: DC | PRN

## 2022-04-04 MED ORDER — CHLORHEXIDINE GLUCONATE 0.12 % MT SOLN
15.0000 mL | Freq: Once | OROMUCOSAL | Status: AC
  Administered 2022-04-04: 15 mL via OROMUCOSAL
  Filled 2022-04-04: qty 15

## 2022-04-04 MED ORDER — ONDANSETRON HCL 4 MG/2ML IJ SOLN
INTRAMUSCULAR | Status: DC | PRN
  Administered 2022-04-04: 4 mg via INTRAVENOUS

## 2022-04-04 MED ORDER — ONDANSETRON HCL 4 MG/2ML IJ SOLN: 4.0000 mg | Freq: Four times a day (QID) | INTRAMUSCULAR | Status: DC | PRN

## 2022-04-04 MED ORDER — BUPIVACAINE HCL (PF) 0.25 % IJ SOLN
INTRAMUSCULAR | Status: DC | PRN
  Administered 2022-04-04 (×2): 25 mL via PERINEURAL

## 2022-04-04 MED ORDER — OXYCODONE HCL 5 MG PO TABS
5.0000 mg | ORAL_TABLET | ORAL | Status: DC | PRN
  Administered 2022-04-04 – 2022-04-05 (×2): 10 mg via ORAL
  Filled 2022-04-04 (×2): qty 2

## 2022-04-04 MED ORDER — KETOROLAC TROMETHAMINE 30 MG/ML IJ SOLN
INTRAMUSCULAR | Status: AC
  Filled 2022-04-04: qty 1

## 2022-04-04 MED ORDER — PHENYLEPHRINE 80 MCG/ML (10ML) SYRINGE FOR IV PUSH (FOR BLOOD PRESSURE SUPPORT)
PREFILLED_SYRINGE | INTRAVENOUS | Status: AC
  Filled 2022-04-04: qty 10

## 2022-04-04 MED ORDER — PROPOFOL 10 MG/ML IV BOLUS
INTRAVENOUS | Status: AC
  Filled 2022-04-04: qty 20

## 2022-04-04 MED ORDER — ACETAMINOPHEN 325 MG PO TABS
650.0000 mg | ORAL_TABLET | Freq: Four times a day (QID) | ORAL | Status: DC | PRN
Start: 2022-04-04 — End: 2022-04-05

## 2022-04-04 MED ORDER — PHENYLEPHRINE HCL-NACL 20-0.9 MG/250ML-% IV SOLN
INTRAVENOUS | Status: DC | PRN
  Administered 2022-04-04: 15 ug/min via INTRAVENOUS

## 2022-04-04 MED ORDER — ALBUTEROL SULFATE (2.5 MG/3ML) 0.083% IN NEBU: 2.5000 mg | INHALATION_SOLUTION | Freq: Four times a day (QID) | RESPIRATORY_TRACT | Status: DC | PRN

## 2022-04-04 MED ORDER — DIPHENHYDRAMINE HCL 50 MG/ML IJ SOLN: 25.0000 mg | Freq: Four times a day (QID) | INTRAMUSCULAR | Status: DC | PRN

## 2022-04-04 MED ORDER — DEXAMETHASONE SODIUM PHOSPHATE 10 MG/ML IJ SOLN
INTRAMUSCULAR | Status: AC
  Filled 2022-04-04: qty 1

## 2022-04-04 MED ORDER — TRAMADOL HCL 50 MG PO TABS: 50.0000 mg | ORAL_TABLET | Freq: Four times a day (QID) | ORAL | Status: DC | PRN

## 2022-04-04 MED ORDER — PROPOFOL 500 MG/50ML IV EMUL
INTRAVENOUS | Status: DC | PRN
Start: 2022-04-04 — End: 2022-04-04
  Administered 2022-04-04: 25 ug/kg/min via INTRAVENOUS

## 2022-04-04 MED ORDER — FENTANYL CITRATE (PF) 100 MCG/2ML IJ SOLN
INTRAMUSCULAR | Status: AC
  Administered 2022-04-04: 50 ug via INTRAVENOUS
  Filled 2022-04-04: qty 2

## 2022-04-04 MED ORDER — ONDANSETRON 4 MG PO TBDP: 4.0000 mg | ORAL_TABLET | Freq: Four times a day (QID) | ORAL | Status: DC | PRN

## 2022-04-04 MED ORDER — ORAL CARE MOUTH RINSE: 15.0000 mL | Freq: Once | OROMUCOSAL | Status: AC

## 2022-04-04 MED ORDER — DIPHENHYDRAMINE HCL 25 MG PO CAPS: 25.0000 mg | ORAL_CAPSULE | Freq: Four times a day (QID) | ORAL | Status: DC | PRN

## 2022-04-04 MED ORDER — FENTANYL CITRATE (PF) 250 MCG/5ML IJ SOLN
INTRAMUSCULAR | Status: AC
Start: 2022-04-04 — End: ?
  Filled 2022-04-04: qty 5

## 2022-04-04 MED ORDER — POTASSIUM CHLORIDE IN NACL 20-0.9 MEQ/L-% IV SOLN
INTRAVENOUS | Status: DC
  Filled 2022-04-04: qty 1000

## 2022-04-04 MED ORDER — 0.9 % SODIUM CHLORIDE (POUR BTL) OPTIME
TOPICAL | Status: DC | PRN
  Administered 2022-04-04: 400 mL

## 2022-04-04 MED ORDER — FENTANYL CITRATE (PF) 250 MCG/5ML IJ SOLN
INTRAMUSCULAR | Status: DC | PRN
  Administered 2022-04-04: 50 ug via INTRAVENOUS
  Administered 2022-04-04 (×2): 25 ug via INTRAVENOUS
  Administered 2022-04-04 (×2): 50 ug via INTRAVENOUS

## 2022-04-04 MED ORDER — MIDAZOLAM HCL 2 MG/2ML IJ SOLN: 2.0000 mg | Freq: Once | INTRAMUSCULAR | Status: AC

## 2022-04-04 MED ORDER — DEXAMETHASONE SODIUM PHOSPHATE 10 MG/ML IJ SOLN
INTRAMUSCULAR | Status: DC | PRN
Start: 2022-04-04 — End: 2022-04-04
  Administered 2022-04-04: 10 mg via INTRAVENOUS

## 2022-04-04 MED ORDER — PHENYLEPHRINE 80 MCG/ML (10ML) SYRINGE FOR IV PUSH (FOR BLOOD PRESSURE SUPPORT)
PREFILLED_SYRINGE | INTRAVENOUS | Status: DC | PRN
  Administered 2022-04-04: 40 ug via INTRAVENOUS
  Administered 2022-04-04: 120 ug via INTRAVENOUS
  Administered 2022-04-04 (×4): 80 ug via INTRAVENOUS

## 2022-04-04 MED ORDER — ONDANSETRON HCL 4 MG/2ML IJ SOLN
INTRAMUSCULAR | Status: AC
  Filled 2022-04-04: qty 2

## 2022-04-04 SURGICAL SUPPLY — 46 items
APL PRP STRL LF DISP 70% ISPRP (MISCELLANEOUS) ×3
APL SKNCLS STERI-STRIP NONHPOA (GAUZE/BANDAGES/DRESSINGS) ×3
APPLIER CLIP 9.375 MED OPEN (MISCELLANEOUS) ×4
APR CLP MED 9.3 20 MLT OPN (MISCELLANEOUS) ×3
BAG COUNTER SPONGE SURGICOUNT (BAG) ×5 IMPLANT
BAG SPNG CNTER NS LX DISP (BAG) ×3
BENZOIN TINCTURE PRP APPL 2/3 (GAUZE/BANDAGES/DRESSINGS) ×5 IMPLANT
BINDER BREAST XLRG (GAUZE/BANDAGES/DRESSINGS) ×1 IMPLANT
BIOPATCH RED 1 DISK 7.0 (GAUZE/BANDAGES/DRESSINGS) ×1 IMPLANT
CANISTER SUCT 3000ML PPV (MISCELLANEOUS) ×5 IMPLANT
CHLORAPREP W/TINT 26 (MISCELLANEOUS) ×5 IMPLANT
CLIP APPLIE 9.375 MED OPEN (MISCELLANEOUS) ×4 IMPLANT
COVER SURGICAL LIGHT HANDLE (MISCELLANEOUS) ×5 IMPLANT
DRAIN CHANNEL 19F RND (DRAIN) ×11 IMPLANT
DRAPE CHEST BREAST 15X10 FENES (DRAPES) ×5 IMPLANT
DRSG TEGADERM 4X4.75 (GAUZE/BANDAGES/DRESSINGS) ×2 IMPLANT
ELECT BLADE 4.0 EZ CLEAN MEGAD (MISCELLANEOUS) ×4
ELECT REM PT RETURN 9FT ADLT (ELECTROSURGICAL) ×4
ELECTRODE BLDE 4.0 EZ CLN MEGD (MISCELLANEOUS) ×4 IMPLANT
ELECTRODE REM PT RTRN 9FT ADLT (ELECTROSURGICAL) ×4 IMPLANT
EVACUATOR SILICONE 100CC (DRAIN) ×11 IMPLANT
GAUZE SPONGE 2X2 8PLY STRL LF (GAUZE/BANDAGES/DRESSINGS) ×4 IMPLANT
GAUZE SPONGE 4X4 12PLY STRL (GAUZE/BANDAGES/DRESSINGS) ×5 IMPLANT
GLOVE BIO SURGEON STRL SZ7 (GLOVE) ×6 IMPLANT
GLOVE BIOGEL PI IND STRL 7.5 (GLOVE) ×4 IMPLANT
GLOVE BIOGEL PI INDICATOR 7.5 (GLOVE) ×1
GOWN STRL REUS W/ TWL LRG LVL3 (GOWN DISPOSABLE) ×8 IMPLANT
GOWN STRL REUS W/TWL LRG LVL3 (GOWN DISPOSABLE) ×20
HEMOSTAT ARISTA ABSORB 3G PWDR (HEMOSTASIS) ×2 IMPLANT
KIT BASIN OR (CUSTOM PROCEDURE TRAY) ×5 IMPLANT
KIT TURNOVER KIT B (KITS) ×5 IMPLANT
NS IRRIG 1000ML POUR BTL (IV SOLUTION) ×5 IMPLANT
PACK GENERAL/GYN (CUSTOM PROCEDURE TRAY) ×5 IMPLANT
PAD ARMBOARD 7.5X6 YLW CONV (MISCELLANEOUS) ×10 IMPLANT
SPECIMEN JAR X LARGE (MISCELLANEOUS) ×6 IMPLANT
SPONGE GAUZE 2X2 STER 10/PKG (GAUZE/BANDAGES/DRESSINGS) ×1
SPONGE INTESTINAL PEANUT (DISPOSABLE) ×1 IMPLANT
SPONGE T-LAP 18X18 ~~LOC~~+RFID (SPONGE) ×2 IMPLANT
STRIP CLOSURE SKIN 1/2X4 (GAUZE/BANDAGES/DRESSINGS) ×5 IMPLANT
SUT ETHILON 2 0 FS 18 (SUTURE) ×10 IMPLANT
SUT MNCRL AB 4-0 PS2 18 (SUTURE) ×7 IMPLANT
SUT SILK 2 0 SH (SUTURE) ×1 IMPLANT
SUT VICRYL 3-0 RB1 18 ABS (SUTURE) ×2 IMPLANT
TAPE STRIPS DRAPE STRL (GAUZE/BANDAGES/DRESSINGS) ×1 IMPLANT
TOWEL GREEN STERILE (TOWEL DISPOSABLE) ×5 IMPLANT
TRAY CATH INTERMITTENT SS 16FR (CATHETERS) ×1 IMPLANT

## 2022-04-04 NOTE — Anesthesia Procedure Notes (Signed)
Anesthesia Regional Block: Pectoralis block  ? ?Pre-Anesthetic Checklist: , timeout performed,  Correct Patient, Correct Site, Correct Laterality,  Correct Procedure, Correct Position, site marked,  Risks and benefits discussed,  Surgical consent,  Pre-op evaluation,  At surgeon's request and post-op pain management ? ?Laterality: Right ? ?Prep: chloraprep     ?  ?Needles:  ?Injection technique: Single-shot ? ?Needle Type: Echogenic Needle   ? ? ?Needle Length: 9cm  ?Needle Gauge: 21  ? ? ? ?Additional Needles: ? ? ?Narrative:  ?Start time: 04/04/2022 10:50 AM ?End time: 04/04/2022 10:56 AM ?Injection made incrementally with aspirations every 5 mL. ? ?Performed by: Personally  ?Anesthesiologist: Albertha Ghee, MD ? ?Additional Notes: ?Pt tolerated the procedure well. ? ? ? ? ?

## 2022-04-04 NOTE — Plan of Care (Signed)
Breast Cancer Bag given and explained and 2 soft pillows given to pt. General post op instructions given to pt, will continue education and JP drain education. ?

## 2022-04-04 NOTE — Op Note (Signed)
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 right prophylactic mastectomy ?2.  Removal of port ?3.  Excision of keloid scar upper chest ?4.  Left modified radical mastectomy ? ?Surgeon:  Maia Petties ?Resident:  Dr. Cameron Sprang ?I was personally present during the key and critical portions of this procedure and immediately available throughout the entire procedure, as documented in my operative note. ? ?Anesthesia: General with bilateral pectoralis blocks ?Indications: This is a 41 year old female with a recent diagnosis of invasive mammary carcinoma of the left breast. She presents with swelling and mass in the left breast which had been present for 2 months. Diagnostic mammogram and [place] on 09/28/2021 showed a large masslike asymmetry involving upper outer and central left breast measuring approximately 9 cm and suspicious calcifications in the upper outer left breast spanning approximately 1.8 cm. Biopsy of two separate areas in this 9 cm area  on 10/04/2021 showed invasive ductal carcinoma Grade 3 with one lymph node involved, ER+(95%)/PR-/Her2-, Ki67 40%.  Five lymph nodes appeared abnormal.   Rib metastases were actually visualized on mammogram.  However, bone scan was negative. ?  ?Port placement was on 10/24/2021.  She has completed her course of neoadjuvant chemotherapy.  She underwent recent MRI which shows continued abnormal enhancement of the 4 quadrants of the left breast but mostly in the outer left breast.  There is some skin thickening noted in the left breast.  The right breast is negative.  After further discussion, the patient wishes to have a prophylactic right mastectomy.  The left side requires a modified radical mastectomy.  We will also revise a large scar in the upper chest.  Her port is also ready for removal. ? ?Description of procedure: The patient is brought to the operating room  and placed in the supine position on operating room table.  After an adequate level of general anesthesia was obtained, her entire chest lower neck and both axilla and upper arms were prepped with ChloraPrep and draped sterile fashion.  A timeout was taken to ensure the proper patient and proper procedure.  We began on the patient's right side.  We outlined an elliptical incision to include the entire nipple areolar complex with a skin marker.  We made our incisions.  We then raised skin flaps using cautery.  Medially we dissected the edge of the sternum.  Superiorly we dissected the chest wall below the clavicle.  Inferiorly we dissected down to the inframammary crease.  Laterally we dissected to the anterior edge of the latissimus muscle.  Hemostasis was obtained with cautery and judicious use of clips.  We then dissected the breast tissue off of the underlying pectoralis muscle.  We dissected from medial to lateral.  The entire breast was removed and marked with a long suture lateral and a short stitch superior.  This was sent for pathology marked as right prophylactic mastectomy.  We then continued dissecting superiorly until we encountered the port below the right clavicle.  We remove the 2 Prolene stay sutures.  The port was removed.  Anesthesia held pressure over the right internal jugular vein for several minutes until we were sure that there was no bleeding.  We inspected the wound carefully for hemostasis.  The wound was irrigated.  We packed the wound with a saline moistened sponge. ? ?We then turned our attention to the keloid scar in the center of the chest.  This is  approximately 4 cm long in a transverse fashion.  We made an elliptical incision around this keloid down into the underlying subcutaneous tissue.  Cautery was then used to remove the entire specimen.  This was sent for pathologic examination.  We undermined the skin flaps superiorly and inferiorly.  The wound was closed with multiple  interrupted deep dermal 3-0 Vicryl sutures.  4-0 Monocryl was used to close the skin. ? ?We then turned our attention to the left side.  We outlined similar skin incisions.  We made our incisions and created our skin flaps using cautery.  There is much more edema in the left breast due to the disease.  We dissected the breast tissue off the underlying pectoralis muscle from medial to lateral.  We continued our dissection up into the axilla.  We retracted the edge of the pectoralis major anteriorly.  We are able to identify the axillary vein.  The thoracodorsal bundle was identified and was preserved.  We then dissected the axillary contents down out of the axilla.  The long thoracic nerve was preserved.  Hemostasis was obtained with clips and cautery.  We removed the entire specimen and oriented with a long suture lateral, short suture superior, and a single stitch at the apex of the axilla.  The wound was thoroughly irrigated.  We inspected for hemostasis.  We then placed drains on both sides.  We used 2 drains on the left side with 1 going into the axilla and the other laying across the inframammary crease.  We used a single drain on the right side laying across the inframammary crease. ? ?At this point, we found that a single SH suture needle was missing from the count.  We obtained a chest x-ray of the surgical site and there is no evidence of a retained needle.  Please note that at the end of the case after the dressing was in place, the needle was located underneath the table. ? ?We again inspected for hemostasis.  Both mastectomy sites were treated with Arista powder.  The drains were secured with 3-0 Ethilon sutures.  We closed both incisions with multiple interrupted 3-0 Vicryl sutures.  Both ends of the incisions were tacked down to the chest wall with 3-0 Vicryl to prevent dogear.  4-0 Monocryl was used to close the skin incisions.  Benzoin and Steri-Strips were applied.  Dressings were applied.  All 3  drains were placed to bulb suction. ? ?At this point, the procedure had lasted almost 3 hours.  We performed a in and out catheterization of her bladder with a yield of 300 cc of clear urine.  She was extubated and brought to the recovery room in stable condition.  All sponge, instrument, and needle counts are correct. ? ?Imogene Burn. Arnice Vanepps, MD, FACS ?[facility]  ?General Surgery ? ? ?04/04/2022 ?3:44 PM ? ? ? ?

## 2022-04-04 NOTE — Anesthesia Preprocedure Evaluation (Signed)
Anesthesia Evaluation  ?Patient identified by MRN, date of birth, ID band ?Patient awake ? ? ? ?Reviewed: ?Allergy & Precautions, H&P , NPO status , Patient's Chart, lab work & pertinent test results ? ?Airway ?Mallampati: II ? ? ?Neck ROM: full ? ? ? Dental ?  ?Pulmonary ?asthma , former smoker,  ?  ?breath sounds clear to auscultation ? ? ? ? ? ? Cardiovascular ?negative cardio ROS ? ? ?Rhythm:regular Rate:Normal ? ? ?  ?Neuro/Psych ?  ? GI/Hepatic ?  ?Endo/Other  ? ? Renal/GU ?  ? ?  ?Musculoskeletal ? ? Abdominal ?  ?Peds ? Hematology ? ?(+) Blood dyscrasia, anemia ,   ?Anesthesia Other Findings ? ? Reproductive/Obstetrics ?Breast CA ? ?  ? ? ? ? ? ? ? ? ? ? ? ? ? ?  ?  ? ? ? ? ? ? ? ? ?Anesthesia Physical ?Anesthesia Plan ? ?ASA: 2 ? ?Anesthesia Plan: General  ? ?Post-op Pain Management: Regional block*  ? ?Induction: Intravenous ? ?PONV Risk Score and Plan: 3 and Ondansetron, Dexamethasone, Midazolam and Treatment may vary due to age or medical condition ? ?Airway Management Planned: Oral ETT ? ?Additional Equipment:  ? ?Intra-op Plan:  ? ?Post-operative Plan: Extubation in OR ? ?Informed Consent: I have reviewed the patients History and Physical, chart, labs and discussed the procedure including the risks, benefits and alternatives for the proposed anesthesia with the patient or authorized representative who has indicated his/her understanding and acceptance.  ? ? ? ?Dental advisory given ? ?Plan Discussed with: CRNA, Anesthesiologist and Surgeon ? ?Anesthesia Plan Comments:   ? ? ? ? ? ? ?Anesthesia Quick Evaluation ? ?

## 2022-04-04 NOTE — Transfer of Care (Signed)
Immediate Anesthesia Transfer of Care Note ? ?Patient: GLORIANN RIEDE ? ?Procedure(s) Performed: LEFT MODIFIED RADICAL MASTECTOMY (Left: Breast) ?RIGHT SIMPLE MASTECTOMY (Right: Breast) ?REMOVAL PORT-A-CATH ?SCAR REVISION OF CHEST WALL ? ?Patient Location: PACU ? ?Anesthesia Type:General ? ?Level of Consciousness: drowsy ? ?Airway & Oxygen Therapy: Patient Spontanous Breathing and Patient connected to face mask oxygen ? ?Post-op Assessment: Report given to RN and Post -op Vital signs reviewed and stable ? ?Post vital signs: Reviewed and stable ? ?Last Vitals:  ?Vitals Value Taken Time  ?BP 132/71 04/04/22 1525  ?Temp    ?Pulse 77 04/04/22 1525  ?Resp 13 04/04/22 1525  ?SpO2 100 % 04/04/22 1525  ?Vitals shown include unvalidated device data. ? ?Last Pain:  ?Vitals:  ? 04/04/22 1021  ?TempSrc:   ?PainSc: 0-No pain  ?   ? ?  ? ?Complications: No notable events documented. ?

## 2022-04-04 NOTE — Anesthesia Procedure Notes (Signed)
Procedure Name: Intubation ?Date/Time: 04/04/2022 11:06 AM ?Performed by: Erick Colace, CRNA ?Pre-anesthesia Checklist: Patient identified, Emergency Drugs available, Suction available and Patient being monitored ?Patient Re-evaluated:Patient Re-evaluated prior to induction ?Oxygen Delivery Method: Circle system utilized ?Preoxygenation: Pre-oxygenation with 100% oxygen ?Induction Type: IV induction and Cricoid Pressure applied ?Ventilation: Mask ventilation without difficulty ?Laryngoscope Size: Mac and 4 ?Grade View: Grade II ?Tube type: Oral ?Tube size: 7.0 mm ?Number of attempts: 1 ?Airway Equipment and Method: Stylet and Oral airway ?Placement Confirmation: ETT inserted through vocal cords under direct vision, positive ETCO2 and breath sounds checked- equal and bilateral ?Secured at: 22 cm ?Tube secured with: Tape ?Dental Injury: Teeth and Oropharynx as per pre-operative assessment  ? ? ? ? ?

## 2022-04-04 NOTE — Interval H&P Note (Signed)
History and Physical Interval Note: ? ?04/04/2022 ?10:16 AM ? ?Alicia Morton  has presented today for surgery, with the diagnosis of LEFT BREAST INVASIVE DUCTAL CARCINOMA WITH AXILLARY METASTASES.  The various methods of treatment have been discussed with the patient and family. After consideration of risks, benefits and other options for treatment, the patient has consented to  Procedure(s): ?LEFT MODIFIED RADICAL MASTECTOMY (Left) ?RIGHT SIMPLE MASTECTOMY (Right) ?REMOVAL PORT-A-CATH (N/A) ?SCAR REVISION OF CHEST WALL (N/A) as a surgical intervention.  The patient's history has been reviewed, patient examined, no change in status, stable for surgery.  I have reviewed the patient's chart and labs.  Questions were answered to the patient's satisfaction.   ? ? ?[name] ? ? ?

## 2022-04-04 NOTE — Anesthesia Procedure Notes (Signed)
Anesthesia Regional Block: Pectoralis block  ? ?Pre-Anesthetic Checklist: , timeout performed,  Correct Patient, Correct Site, Correct Laterality,  Correct Procedure, Correct Position, site marked,  Risks and benefits discussed,  Surgical consent,  Pre-op evaluation,  At surgeon's request and post-op pain management ? ?Laterality: Left ? ?Prep: chloraprep     ?  ?Needles:  ?Injection technique: Single-shot ? ?Needle Type: Echogenic Needle   ? ? ?Needle Length: 9cm  ?Needle Gauge: 21  ? ? ? ?Additional Needles: ? ? ?Narrative:  ?Start time: 04/04/2022 10:45 AM ?End time: 04/04/2022 10:50 AM ?Injection made incrementally with aspirations every 5 mL. ? ?Performed by: Personally  ?Anesthesiologist: Albertha Ghee, MD ? ?Additional Notes: ?Pt tolerated the procedure well. ? ? ? ? ?

## 2022-04-05 ENCOUNTER — Encounter (HOSPITAL_COMMUNITY): Payer: Self-pay | Admitting: Surgery

## 2022-04-05 DIAGNOSIS — C50812 Malignant neoplasm of overlapping sites of left female breast: Secondary | ICD-10-CM | POA: Diagnosis not present

## 2022-04-05 LAB — CBC
HCT: 29.6 % — ABNORMAL LOW (ref 36.0–46.0)
Hemoglobin: 9.2 g/dL — ABNORMAL LOW (ref 12.0–15.0)
MCH: 26.7 pg (ref 26.0–34.0)
MCHC: 31.1 g/dL (ref 30.0–36.0)
MCV: 85.8 fL (ref 80.0–100.0)
Platelets: 372 10*3/uL (ref 150–400)
RBC: 3.45 MIL/uL — ABNORMAL LOW (ref 3.87–5.11)
RDW: 18.2 % — ABNORMAL HIGH (ref 11.5–15.5)
WBC: 11.1 10*3/uL — ABNORMAL HIGH (ref 4.0–10.5)
nRBC: 0 % (ref 0.0–0.2)

## 2022-04-05 LAB — BASIC METABOLIC PANEL
Anion gap: 7 (ref 5–15)
BUN: 10 mg/dL (ref 6–20)
CO2: 25 mmol/L (ref 22–32)
Calcium: 8.8 mg/dL — ABNORMAL LOW (ref 8.9–10.3)
Chloride: 107 mmol/L (ref 98–111)
Creatinine, Ser: 0.6 mg/dL (ref 0.44–1.00)
GFR, Estimated: >60 mL/min (ref >60)
Glucose, Bld: 110 mg/dL — ABNORMAL HIGH (ref 70–99)
Potassium: 3.9 mmol/L (ref 3.5–5.1)
Sodium: 139 mmol/L (ref 135–145)

## 2022-04-05 MED ORDER — OXYCODONE HCL 5 MG PO TABS: 5.0000 mg | ORAL_TABLET | Freq: Four times a day (QID) | ORAL | 0 refills | Status: DC | PRN

## 2022-04-05 MED ORDER — METHOCARBAMOL 500 MG PO TABS: 500.0000 mg | ORAL_TABLET | Freq: Four times a day (QID) | ORAL | 0 refills | Status: DC | PRN

## 2022-04-05 NOTE — Plan of Care (Signed)
?  Problem: Activity: ?Goal: Risk for activity intolerance will decrease ?Outcome: Progressing ?  ?Problem: Education: ?Goal: Knowledge of General Education information will improve ?Description: Including pain rating scale, medication(s)/side effects and non-pharmacologic comfort measures ?Outcome: Progressing ?  ?Problem: Nutrition: ?Goal: Adequate nutrition will be maintained ?Outcome: Progressing ?  ?Problem: Pain Managment: ?Goal: General experience of comfort will improve ?Outcome: Progressing ?  ?Problem: Activity: ?Goal: Ability to maintain or regain function will improve ?Outcome: Progressing ?  ?

## 2022-04-05 NOTE — Discharge Instructions (Signed)
[facility] [phone]  MASTECTOMY: POST OP INSTRUCTIONS  Always review your discharge instruction sheet given to you by the facility where your surgery was performed. IF YOU HAVE DISABILITY OR FAMILY LEAVE FORMS, YOU MUST BRING THEM TO THE OFFICE FOR PROCESSING.   DO NOT GIVE THEM TO YOUR DOCTOR. A prescription for pain medication may be given to you upon discharge.  Take your pain medication as prescribed, if needed.  If narcotic pain medicine is not needed, then you may take acetaminophen (Tylenol) or ibuprofen (Advil) as needed. Take your usually prescribed medications unless otherwise directed. If you need a refill on your pain medication, please contact your pharmacy.  They will contact our office to request authorization.  Prescriptions will not be filled after 5pm or on week-ends. You should follow a light diet the first few days after arrival home, such as soup and crackers, etc.  Resume your normal diet the day after surgery. Most patients will experience some swelling and bruising on the chest and underarm.  Ice packs will help.  Swelling and bruising can take several days to resolve.  It is common to experience some constipation if taking pain medication after surgery.  Increasing fluid intake and taking a stool softener (such as Colace) will usually help or prevent this problem from occurring.  A mild laxative (Milk of Magnesia or Miralax) should be taken according to package instructions if there are no bowel movements after 48 hours. Unless discharge instructions indicate otherwise, leave your bandage dry and in place until your next appointment in 3-5 days.  You may take a limited sponge bath.  No tube baths or showers until the drains are removed.  You may have steri-strips (small skin tapes) in place directly over the incision.  These strips should be left on the skin for 7-10 days.  If your surgeon used skin glue on the incision, you may shower in 24 hours.   The glue will flake off over the next 2-3 weeks.  Any sutures or staples will be removed at the office during your follow-up visit. DRAINS:  If you have drains in place, it is important to keep a list of the amount of drainage produced each day in your drains.  Before leaving the hospital, you should be instructed on drain care.  Call our office if you have any questions about your drains. ACTIVITIES:  You may resume regular (light) daily activities beginning the next day--such as daily self-care, walking, climbing stairs--gradually increasing activities as tolerated.  You may have sexual intercourse when it is comfortable.  Refrain from any heavy lifting or straining until approved by your doctor. You may drive when you are no longer taking prescription pain medication, you can comfortably wear a seatbelt, and you can safely maneuver your car and apply brakes. RETURN TO WORK:  __________________________________________________________ You should see your doctor in the office for a follow-up appointment approximately 3-5 days after your surgery.  Your doctor's nurse will typically make your follow-up appointment when she calls you with your pathology report.  Expect your pathology report 2-3 business days after your surgery.  You may call to check if you do not hear from us after three days.   OTHER INSTRUCTIONS: ______________________________________________________________________________________________ ____________________________________________________________________________________________ WHEN TO CALL YOUR DOCTOR: Fever over 101.0 Nausea and/or vomiting Extreme swelling or bruising Continued bleeding from incision. Increased pain, redness, or drainage from the incision. The clinic staff is available to answer your questions during regular business hours.  Please don't hesitate   to call and ask to speak to one of the nurses for clinical concerns.  If you have a medical emergency, go to the  nearest emergency room or call 911.  A surgeon from [facility] is always on call at the hospital. [address], [address], [place], [place]  [address] ? [address] [address], [place],    [address] [phone] ? [phone] ? FAX [phone] Web site: [email or web address]  

## 2022-04-05 NOTE — Discharge Summary (Signed)
Physician Discharge Summary  ?Patient ID: ?Alicia Morton ?MRN: [id number] ?DOB/AGE: 04-17-1981 41 y.o. ? ?Admit date: 04/04/2022 ?Discharge date: 04/05/2022 ? 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 ?Discharge Diagnoses: same ?Principal Problem: ?  Invasive ductal carcinoma of left breast ([place]) ? ? ?Discharged Condition: good ? ?Hospital Course: Right prophylactic mastectomy/ left modified radical mastectomy/ port removal/ scar revision 04/04/22.  Patient did well overnight with no sign of hematoma.  Pain is located mostly in the left axilla.  Moderate drain output.  Ready for discharge. ? ? ?Treatments: surgery: see above ? ?Discharge Exam: ?Blood pressure (!) 118/58, pulse 87, temperature 98 ?F (36.7 ?C), temperature source Oral, resp. rate 18, height '5\' 10"'$  (1.778 m), weight 110.2 kg, last menstrual period 09/27/2021, SpO2 100 %. ?WDWN in NAD ?Chest - skin flaps viable ?Drains - serosanguinous ?Minimal staining on dressing ?Disposition: Discharge disposition: 01-Home or Self Care ? ? ? ? ? ? ?Discharge Instructions   ? ? Call MD for:  persistant nausea and vomiting   Complete by: As directed ?  ? Call MD for:  redness, tenderness, or signs of infection (pain, swelling, redness, odor or green/yellow discharge around incision site)   Complete by: As directed ?  ? Call MD for:  severe uncontrolled pain   Complete by: As directed ?  ? Call MD for:  temperature >100.4   Complete by: As directed ?  ? Diet general   Complete by: As directed ?  ? Driving Restrictions   Complete by: As directed ?  ? Do not drive while taking pain medications  ? Increase activity slowly   Complete by: As directed ?  ? May shower / Bathe   Complete by: As directed ?  ? ?  ? ?Allergies as of 04/05/2022   ?No Known Allergies ?  ? ?  ?Medication List  ?  ? ?TAKE these medications   ? ?albuterol 108 (90 Base) MCG/ACT inhaler ?Commonly known as: VENTOLIN HFA ?Inhale 1-2 puffs into the lungs every 6  (six) hours as needed for wheezing or shortness of breath. ?  ?albuterol (2.5 MG/3ML) 0.083% nebulizer solution ?Commonly known as: PROVENTIL ?Take 3 mLs (2.5 mg total) by nebulization every 6 (six) hours as needed for wheezing or shortness of breath. ?  ?furosemide 20 MG tablet ?Commonly known as: LASIX ?Take 1 tablet (20 mg total) by mouth daily as needed. ?  ?methocarbamol 500 MG tablet ?Commonly known as: ROBAXIN ?Take 1 tablet (500 mg total) by mouth every 6 (six) hours as needed for muscle spasms. ?  ?oxyCODONE 5 MG immediate release tablet ?Commonly known as: Oxy IR/ROXICODONE ?Take 1 tablet (5 mg total) by mouth every 6 (six) hours as needed for moderate pain. ?  ? ?  ? ? Follow-up Information   ? ? Donnie Mesa, MD Follow up.   ?Specialty: General Surgery ?Contact information: ?[address] ?[place] [address] ?[phone] ? ? ?  ?  ? ?  ?  ? ?  ? ? ?Signed: ?Imogene Burn Niala Stcharles ?04/05/2022, 12:28 PM ? ? ?

## 2022-04-05 NOTE — Anesthesia Postprocedure Evaluation (Signed)
Anesthesia Post Note ? ?Patient: Alicia Morton ? ?Procedure(s) Performed: LEFT MODIFIED RADICAL MASTECTOMY (Left: Breast) ?RIGHT SIMPLE MASTECTOMY (Right: Breast) ?REMOVAL PORT-A-CATH ?SCAR REVISION OF CHEST WALL ? ?  ? ?Patient location during evaluation: PACU ?Anesthesia Type: General and Regional ?Level of consciousness: awake and alert ?Pain management: pain level controlled ?Vital Signs Assessment: post-procedure vital signs reviewed and stable ?Respiratory status: spontaneous breathing, nonlabored ventilation, respiratory function stable and patient connected to nasal cannula oxygen ?Cardiovascular status: blood pressure returned to baseline and stable ?Postop Assessment: no apparent nausea or vomiting ?Anesthetic complications: no ? ? ?No notable events documented. ? ?Last Vitals:  ?Vitals:  ? 04/05/22 1151 04/05/22 1236  ?BP:    ?Pulse:    ?Resp: 18 17  ?Temp:    ?SpO2:    ?  ?Last Pain:  ?Vitals:  ? 04/05/22 1236  ?TempSrc:   ?PainSc: 2   ? ? ?  ?  ?  ?  ?  ?  ? ?[place] ? ? ? ? ?

## 2022-04-06 LAB — SURGICAL PATHOLOGY

## 2022-04-09 ENCOUNTER — Encounter: Payer: Self-pay | Admitting: *Deleted

## 2022-04-16 ENCOUNTER — Inpatient Hospital Stay: Payer: Medicaid Other | Attending: Hematology and Oncology | Admitting: Hematology and Oncology

## 2022-04-16 ENCOUNTER — Other Ambulatory Visit: Payer: Self-pay

## 2022-04-16 DIAGNOSIS — Z9013 Acquired absence of bilateral breasts and nipples: Secondary | ICD-10-CM | POA: Insufficient documentation

## 2022-04-16 DIAGNOSIS — Z87891 Personal history of nicotine dependence: Secondary | ICD-10-CM | POA: Diagnosis not present

## 2022-04-16 DIAGNOSIS — Z170 Estrogen receptor positive status [ER+]: Secondary | ICD-10-CM | POA: Insufficient documentation

## 2022-04-16 DIAGNOSIS — C50412 Malignant neoplasm of upper-outer quadrant of left female breast: Secondary | ICD-10-CM | POA: Insufficient documentation

## 2022-04-16 NOTE — Assessment & Plan Note (Addendum)
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 ?CT CAP 10/24/2021: Left breast cancer and left axillary lymph nodes, no evidence of distant metastatic disease. ?Bone scan 10/23/2021: No evidence of bony metastases. ?Left mandible activity benign ?? ?Treatment plan: ?1.??Neoadjuvant chemotherapy with dose dense Adriamycin and Cytoxan followed by Taxol completed 03/23/2022 ?2.?04/04/2022:Bilateral mastectomies ?Right mastectomy: Benign ?Left mastectomy: Negative for residual cancer (pathologic complete response) Dr. Georgette Dover removed a lot of tissue from the axilla but there were no viable lymph nodes. ?3.??Adjuvant radiation ?4.??Adjuvant antiestrogen therapy with ovarian function suppression plus + AI + Verzinio (we will discuss this option versus tamoxifen) ?------------------------------------------------------------------------------------------------------------------------------------------- ?Patient had a complete pathologic response to chemotherapy.  This indicates a very favorable prognosis. ?Return to clinic after radiation to start antiestrogen therapy ? ?Patient still has drains in place and is uncomfortable as a result of them. ?Return to clinic at the end of radiation. ?

## 2022-04-17 ENCOUNTER — Encounter: Payer: Self-pay | Admitting: *Deleted

## 2022-04-17 ENCOUNTER — Other Ambulatory Visit: Payer: Self-pay | Admitting: *Deleted

## 2022-04-17 DIAGNOSIS — Z170 Estrogen receptor positive status [ER+]: Secondary | ICD-10-CM

## 2022-04-23 NOTE — Progress Notes (Signed)
Location of Breast Cancer:  ?Malignant neoplasm of upper-outer quadrant of left breast in female, estrogen receptor positive ? ?Histology per Pathology Report:  ?04/04/2022 ?FINAL MICROSCOPIC DIAGNOSIS:  ?A. BREAST, RIGHT, MASTECTOMY:  ?- Benign breast with proliferative fibrocystic changes including extensive stromal fibrosis and focal epithelial hyperplasia without atypia  ?- Multifocal fibromatoid change  ?- Negative for carcinoma  ?B. KELOID, UPPER CHEST:  ?Hypertrophic scar with changes consistent with keloid  ?C. BREAST, LEFT, MODIFIED RADICAL MASTECTOMY:  ?- Negative for residual carcinoma (ypT0)  ?- No lymph nodes identified  ?- Changes consistent with neoadjuvant therapy and prior biopsy sites (two)  ?- Proliferative fibrocystic changes including extensive stromal fibrosis and focal epithelial hyperplasia without atypia  ?- Multifocal fibromatoid change  ?- Benign nipple  ? ?10/04/2021 ?Diagnosis ?1. Breast, left, needle core biopsy, 8 o'clock, ribbon clip, lower inner ?- INVASIVE MAMMARY CARCINOMA ?- SEE COMMENT ?2. Breast, left, needle core biopsy, 2 o'clock, coil clip, upper outer ?- INVASIVE MAMMARY CARCINOMA ?- SEE COMMENT ?3. Lymph node, needle/core biopsy, left axilla LN, tribell clip ?- INVASIVE MAMMARY CARCINOMA ?- NO NODAL TISSUE IDENTIFIED ?- SEE COMMENT ?Microscopic Comment ?1. and 2. The biopsy material shows an infiltrative proliferation of cells with large vesicular nuclei with inconspicuous nucleoli, arranged linearly and in small clusters. Based on the biopsy, the carcinoma appears Nottingham grade 3 of 3 and measures 1.4 cm in greatest linear extent. ? ?Receptor Status:  ?Left breast: ER(95%), PR (0%), Her2-neu (Negative), Ki-67(40%) ?Left axilla lymph node: ER(95%), PR (0%), Her2-neu (Negative), Ki-67(50%) ? ?Did patient present with symptoms (if so, please note symptoms) or was this found on screening mammography?: Patient had a palpable left breast mass and left breast swelling for 2  months. Mammogram on 09/28/2021 revealed 9 cm abnormality at 2 o'clock position plus another mass at 8:00 measuring 1.3 cm ? ?Past/Anticipated interventions by surgeon, if any:  ?04/04/2022 ?--Dr. Donnie Mesa ?Right prophylactic mastectomy ?Removal of port ?Excision of keloid scar upper chest ?Left modified radical mastectomy ? ?Past/Anticipated interventions by medical oncology, if any:  ?Under care of Dr. Nicholas Lose ?04/16/2022 ?--Treatment plan: ?Neoadjuvant chemotherapy with dose dense Adriamycin and Cytoxan followed by Taxol completed 03/23/2022 ?04/04/2022:Bilateral mastectomies ?Right mastectomy: Benign ?Left mastectomy: Negative for residual cancer (pathologic complete response) Dr. Georgette Dover removed a lot of tissue from the axilla but there were no viable lymph nodes. ?Adjuvant radiation ?Adjuvant antiestrogen therapy with ovarian function suppression plus + AI + Verzinio (we will discuss this option versus tamoxifen) ?--Patient had a complete pathologic response to chemotherapy.  This indicates a very favorable prognosis. ?--Patient still has drains in place and is uncomfortable as a result of them. ?--Leg swelling: Currently on Lasix. 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 ?Lymphedema issues, if any:  Patient denies   ? ?Pain issues, if any: Patient denies  ? ?SAFETY ISSUES: ?Prior radiation? No ?Pacemaker/ICD? No ?Possible current pregnancy? No--has not resumed menstrual cycle since completing systemic treatment ?Is the patient on methotrexate? No ? ?Current Complaints / other details:  Scheduled for PT evaluation on 04/30/22 ?   ? ? ? ?
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
## 2022-04-24 ENCOUNTER — Ambulatory Visit
Admission: RE | Admit: 2022-04-24 | Discharge: 2022-04-24 | Disposition: A | Discharge status: Home / Self Care | Payer: Medicaid Other | Source: Ambulatory Visit | Attending: Radiation Oncology | Admitting: Radiation Oncology

## 2022-04-24 ENCOUNTER — Other Ambulatory Visit: Payer: Self-pay

## 2022-04-24 ENCOUNTER — Encounter: Payer: Self-pay | Admitting: Radiation Oncology

## 2022-04-24 VITALS — BP 114/51 | HR 68 | Temp 97.7°F | Resp 20 | Ht 70.0 in | Wt 236.2 lb

## 2022-04-24 DIAGNOSIS — G629 Polyneuropathy, unspecified: Secondary | ICD-10-CM | POA: Diagnosis not present

## 2022-04-24 DIAGNOSIS — Z170 Estrogen receptor positive status [ER+]: Secondary | ICD-10-CM | POA: Insufficient documentation

## 2022-04-24 DIAGNOSIS — C50412 Malignant neoplasm of upper-outer quadrant of left female breast: Secondary | ICD-10-CM | POA: Insufficient documentation

## 2022-04-24 DIAGNOSIS — Z87891 Personal history of nicotine dependence: Secondary | ICD-10-CM | POA: Diagnosis not present

## 2022-04-24 DIAGNOSIS — L910 Hypertrophic scar: Secondary | ICD-10-CM | POA: Diagnosis not present

## 2022-04-24 DIAGNOSIS — Z9013 Acquired absence of bilateral breasts and nipples: Secondary | ICD-10-CM | POA: Diagnosis not present

## 2022-04-24 DIAGNOSIS — R600 Localized edema: Secondary | ICD-10-CM | POA: Diagnosis not present

## 2022-04-25 ENCOUNTER — Encounter: Payer: Self-pay | Admitting: *Deleted

## 2022-04-30 ENCOUNTER — Ambulatory Visit: Admission: RE | Admit: 2022-04-30 | Payer: Medicaid Other | Source: Ambulatory Visit | Admitting: Radiation Oncology

## 2022-04-30 ENCOUNTER — Ambulatory Visit
Admission: RE | Admit: 2022-04-30 | Discharge: 2022-04-30 | Disposition: A | Discharge status: Home / Self Care | Payer: Medicaid Other | Source: Ambulatory Visit | Attending: Radiation Oncology | Admitting: Radiation Oncology

## 2022-04-30 ENCOUNTER — Ambulatory Visit: Payer: Medicaid Other | Attending: Hematology and Oncology | Admitting: Physical Therapy

## 2022-04-30 ENCOUNTER — Encounter: Payer: Self-pay | Admitting: Physical Therapy

## 2022-04-30 ENCOUNTER — Other Ambulatory Visit: Payer: Self-pay

## 2022-04-30 DIAGNOSIS — M25611 Stiffness of right shoulder, not elsewhere classified: Secondary | ICD-10-CM | POA: Insufficient documentation

## 2022-04-30 DIAGNOSIS — R293 Abnormal posture: Secondary | ICD-10-CM | POA: Insufficient documentation

## 2022-04-30 DIAGNOSIS — M25612 Stiffness of left shoulder, not elsewhere classified: Secondary | ICD-10-CM | POA: Diagnosis not present

## 2022-04-30 DIAGNOSIS — Z483 Aftercare following surgery for neoplasm: Secondary | ICD-10-CM | POA: Insufficient documentation

## 2022-04-30 DIAGNOSIS — Z170 Estrogen receptor positive status [ER+]: Secondary | ICD-10-CM | POA: Diagnosis not present

## 2022-04-30 DIAGNOSIS — C50412 Malignant neoplasm of upper-outer quadrant of left female breast: Secondary | ICD-10-CM | POA: Insufficient documentation

## 2022-04-30 DIAGNOSIS — C50812 Malignant neoplasm of overlapping sites of left female breast: Secondary | ICD-10-CM | POA: Insufficient documentation

## 2022-04-30 DIAGNOSIS — Z510 Encounter for antineoplastic radiation therapy: Secondary | ICD-10-CM | POA: Insufficient documentation

## 2022-04-30 LAB — PREGNANCY, URINE: Preg Test, Ur: NEGATIVE
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
## 2022-04-30 NOTE — Patient Instructions (Signed)
[place] ? [address], [address] ? [place] [address] ? [phone] ? ?After Breast Cancer Class ?It is recommended you attend the ABC class to be educated on lymphedema risk reduction. This class is free of charge and lasts for 1 hour. It is a 1-time class. You will need to download the Webex app either on your phone or computer. We will send you a link the night before or the morning of the class. You should be able to click on that link to join the class. This is not a confidential class. You don't have to turn your camera on, but other participants may be able to see your email address. You are scheduled for June 5th at 11:00. ? ?Scar massage ?You can begin gentle scar massage to you incision sites. Gently place one hand on the incision and move the skin (without sliding on the skin) in various directions. Do this for a few minutes and then you can gently massage either coconut oil or vitamin E cream into the scars. ? ?Compression garment ?You should continue wearing your compression bra until you feel like you no longer have swelling. ? ?Home exercise Program ?Continue doing the exercises you were given until you feel like you can do them without feeling any tightness at the end.  ? ?Walking Program ?Studies show that 30 minutes of walking per day (fast enough to elevate your heart rate) can significantly reduce the risk of a cancer recurrence. If you can't walk due to other medical reasons, we encourage you to find another activity you could do (like a stationary bike or water exercise). ? ?Posture ?After breast cancer surgery, people frequently sit with rounded shoulders posture because it puts their incisions on slack and feels better. If you sit like this and scar tissue forms in that position, you can become very tight and have pain sitting or standing with good posture. Try to be aware of your posture and sit and stand up tall to heal properly. ? ?Follow up PT: ?It is  recommended you return every 3 months for the first 3 years following surgery to be assessed on the SOZO machine for an L-Dex score. This helps prevent clinically significant lymphedema in 95% of patients. These follow up screens are 10 minute appointments that you are not billed for. You are scheduled for July 17th at 3:00. ? ?

## 2022-05-02 ENCOUNTER — Ambulatory Visit: Payer: Medicaid Other | Admitting: Rehabilitation

## 2022-05-02 ENCOUNTER — Encounter: Payer: Self-pay | Admitting: Rehabilitation

## 2022-05-02 DIAGNOSIS — M25612 Stiffness of left shoulder, not elsewhere classified: Secondary | ICD-10-CM

## 2022-05-02 DIAGNOSIS — C50812 Malignant neoplasm of overlapping sites of left female breast: Secondary | ICD-10-CM | POA: Diagnosis not present

## 2022-05-02 DIAGNOSIS — Z170 Estrogen receptor positive status [ER+]: Secondary | ICD-10-CM

## 2022-05-02 DIAGNOSIS — R293 Abnormal posture: Secondary | ICD-10-CM

## 2022-05-02 DIAGNOSIS — Z483 Aftercare following surgery for neoplasm: Secondary | ICD-10-CM

## 2022-05-02 DIAGNOSIS — M25611 Stiffness of right shoulder, not elsewhere classified: Secondary | ICD-10-CM
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
## 2022-05-07 ENCOUNTER — Other Ambulatory Visit: Payer: Self-pay

## 2022-05-07 ENCOUNTER — Ambulatory Visit
Admission: RE | Admit: 2022-05-07 | Discharge: 2022-05-07 | Disposition: A | Discharge status: Home / Self Care | Payer: Medicaid Other | Source: Ambulatory Visit | Attending: Radiation Oncology | Admitting: Radiation Oncology

## 2022-05-07 ENCOUNTER — Encounter: Payer: Self-pay | Admitting: *Deleted

## 2022-05-07 DIAGNOSIS — Z170 Estrogen receptor positive status [ER+]: Secondary | ICD-10-CM

## 2022-05-07 DIAGNOSIS — Z510 Encounter for antineoplastic radiation therapy: Secondary | ICD-10-CM | POA: Diagnosis not present

## 2022-05-07 LAB — RAD ONC ARIA SESSION SUMMARY

## 2022-05-07 MED ORDER — ALRA NON-METALLIC DEODORANT (RAD-ONC)
1.0000 "application " | Freq: Once | TOPICAL | Status: AC
  Administered 2022-05-07: 1 via TOPICAL

## 2022-05-07 MED ORDER — RADIAPLEXRX EX GEL: Freq: Once | CUTANEOUS | Status: AC

## 2022-05-07 NOTE — Progress Notes (Signed)

## 2022-05-08 ENCOUNTER — Ambulatory Visit
Admission: RE | Admit: 2022-05-08 | Discharge: 2022-05-08 | Disposition: A | Discharge status: Home / Self Care | Payer: Medicaid Other | Source: Ambulatory Visit | Attending: Radiation Oncology | Admitting: Radiation Oncology

## 2022-05-08 ENCOUNTER — Other Ambulatory Visit: Payer: Self-pay

## 2022-05-08 DIAGNOSIS — Z510 Encounter for antineoplastic radiation therapy: Secondary | ICD-10-CM | POA: Diagnosis not present

## 2022-05-08 LAB — RAD ONC ARIA SESSION SUMMARY
Course Elapsed Days: 1
Plan Fractions Treated to Date: 1
Plan Fractions Treated to Date: 2
Plan Prescribed Dose Per Fraction: 2 Gy
Plan Prescribed Dose Per Fraction: 2 Gy
Plan Total Fractions Prescribed: 12
Plan Total Fractions Prescribed: 25
Plan Total Prescribed Dose: 24 Gy
Plan Total Prescribed Dose: 50 Gy
Reference Point Dosage Given to Date: 4 Gy
Reference Point Dosage Given to Date: 4 Gy
Reference Point Session Dosage Given: 2 Gy
Reference Point Session Dosage Given: 2 Gy
Session Number: 2

## 2022-05-09 ENCOUNTER — Other Ambulatory Visit: Payer: Self-pay

## 2022-05-09 ENCOUNTER — Ambulatory Visit
Admission: RE | Admit: 2022-05-09 | Discharge: 2022-05-09 | Disposition: A | Discharge status: Home / Self Care | Payer: Medicaid Other | Source: Ambulatory Visit | Attending: Radiation Oncology | Admitting: Radiation Oncology

## 2022-05-09 ENCOUNTER — Encounter: Payer: Self-pay | Admitting: Rehabilitation

## 2022-05-09 ENCOUNTER — Ambulatory Visit: Payer: Medicaid Other | Admitting: Rehabilitation

## 2022-05-09 DIAGNOSIS — Z483 Aftercare following surgery for neoplasm: Secondary | ICD-10-CM

## 2022-05-09 DIAGNOSIS — Z510 Encounter for antineoplastic radiation therapy: Secondary | ICD-10-CM | POA: Diagnosis not present

## 2022-05-09 DIAGNOSIS — R293 Abnormal posture: Secondary | ICD-10-CM

## 2022-05-09 DIAGNOSIS — M25612 Stiffness of left shoulder, not elsewhere classified: Secondary | ICD-10-CM

## 2022-05-09 DIAGNOSIS — C50812 Malignant neoplasm of overlapping sites of left female breast: Secondary | ICD-10-CM

## 2022-05-09 DIAGNOSIS — M25611 Stiffness of right shoulder, not elsewhere classified: Secondary | ICD-10-CM

## 2022-05-09 LAB — RAD ONC ARIA SESSION SUMMARY
Course Elapsed Days: 2
Plan Fractions Treated to Date: 2
Plan Fractions Treated to Date: 3
Plan Prescribed Dose Per Fraction: 2 Gy
Plan Prescribed Dose Per Fraction: 2 Gy
Plan Total Fractions Prescribed: 13
Plan Total Fractions Prescribed: 25
Plan Total Prescribed Dose: 26 Gy
Plan Total Prescribed Dose: 50 Gy
Reference Point Dosage Given to Date: 6 Gy
Reference Point Dosage Given to Date: 6 Gy
Reference Point Session Dosage Given: 2 Gy
Reference Point Session Dosage Given: 2 Gy
Session Number: 3
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
## 2022-05-10 ENCOUNTER — Ambulatory Visit
Admission: RE | Admit: 2022-05-10 | Discharge: 2022-05-10 | Disposition: A | Discharge status: Home / Self Care | Payer: Medicaid Other | Source: Ambulatory Visit | Attending: Radiation Oncology | Admitting: Radiation Oncology

## 2022-05-10 ENCOUNTER — Other Ambulatory Visit: Payer: Self-pay

## 2022-05-10 ENCOUNTER — Telehealth: Payer: Self-pay | Admitting: Hematology and Oncology

## 2022-05-10 DIAGNOSIS — Z510 Encounter for antineoplastic radiation therapy: Secondary | ICD-10-CM | POA: Diagnosis not present

## 2022-05-10 LAB — RAD ONC ARIA SESSION SUMMARY
Course Elapsed Days: 3
Plan Fractions Treated to Date: 2
Plan Fractions Treated to Date: 4
Plan Prescribed Dose Per Fraction: 2 Gy
Plan Prescribed Dose Per Fraction: 2 Gy
Plan Total Fractions Prescribed: 12
Plan Total Fractions Prescribed: 25
Plan Total Prescribed Dose: 24 Gy
Plan Total Prescribed Dose: 50 Gy
Reference Point Dosage Given to Date: 8 Gy
Reference Point Dosage Given to Date: 8 Gy
Reference Point Session Dosage Given: 2 Gy
Reference Point Session Dosage Given: 2 Gy
Session Number: 4

## 2022-05-10 NOTE — Telephone Encounter (Signed)
.  Called patient to schedule appointment per 5/22 inbasket, patient is aware of date and time.

## 2022-05-11 ENCOUNTER — Ambulatory Visit
Admission: RE | Admit: 2022-05-11 | Discharge: 2022-05-11 | Disposition: A | Discharge status: Home / Self Care | Payer: Medicaid Other | Source: Ambulatory Visit | Attending: Radiation Oncology | Admitting: Radiation Oncology

## 2022-05-11 ENCOUNTER — Other Ambulatory Visit: Payer: Self-pay

## 2022-05-11 DIAGNOSIS — Z510 Encounter for antineoplastic radiation therapy: Secondary | ICD-10-CM | POA: Diagnosis not present

## 2022-05-11 LAB — RAD ONC ARIA SESSION SUMMARY
Course Elapsed Days: 4
Plan Fractions Treated to Date: 3
Plan Fractions Treated to Date: 5
Plan Prescribed Dose Per Fraction: 2 Gy
Plan Prescribed Dose Per Fraction: 2 Gy
Plan Total Fractions Prescribed: 13
Plan Total Fractions Prescribed: 25
Plan Total Prescribed Dose: 26 Gy
Plan Total Prescribed Dose: 50 Gy
Reference Point Dosage Given to Date: 10 Gy
Reference Point Dosage Given to Date: 10 Gy
Reference Point Session Dosage Given: 2 Gy
Reference Point Session Dosage Given: 2 Gy
Session Number: 5

## 2022-05-15 ENCOUNTER — Ambulatory Visit
Admission: RE | Admit: 2022-05-15 | Discharge: 2022-05-15 | Disposition: A | Discharge status: Home / Self Care | Payer: Medicaid Other | Source: Ambulatory Visit | Attending: Radiation Oncology | Admitting: Radiation Oncology

## 2022-05-15 ENCOUNTER — Other Ambulatory Visit: Payer: Self-pay

## 2022-05-15 DIAGNOSIS — Z510 Encounter for antineoplastic radiation therapy: Secondary | ICD-10-CM | POA: Diagnosis not present

## 2022-05-15 LAB — RAD ONC ARIA SESSION SUMMARY
Course Elapsed Days: 8
Plan Fractions Treated to Date: 3
Plan Fractions Treated to Date: 6
Plan Prescribed Dose Per Fraction: 2 Gy
Plan Prescribed Dose Per Fraction: 2 Gy
Plan Total Fractions Prescribed: 12
Plan Total Fractions Prescribed: 25
Plan Total Prescribed Dose: 24 Gy
Plan Total Prescribed Dose: 50 Gy
Reference Point Dosage Given to Date: 12 Gy
Reference Point Dosage Given to Date: 12 Gy
Reference Point Session Dosage Given: 2 Gy
Reference Point Session Dosage Given: 2 Gy
Session Number: 6

## 2022-05-16 ENCOUNTER — Ambulatory Visit
Admission: RE | Admit: 2022-05-16 | Discharge: 2022-05-16 | Disposition: A | Discharge status: Home / Self Care | Payer: Medicaid Other | Source: Ambulatory Visit | Attending: Radiation Oncology | Admitting: Radiation Oncology

## 2022-05-16 ENCOUNTER — Ambulatory Visit: Payer: Medicaid Other

## 2022-05-16 ENCOUNTER — Other Ambulatory Visit: Payer: Self-pay

## 2022-05-16 DIAGNOSIS — R293 Abnormal posture: Secondary | ICD-10-CM

## 2022-05-16 DIAGNOSIS — C50812 Malignant neoplasm of overlapping sites of left female breast: Secondary | ICD-10-CM

## 2022-05-16 DIAGNOSIS — M25612 Stiffness of left shoulder, not elsewhere classified: Secondary | ICD-10-CM

## 2022-05-16 DIAGNOSIS — Z483 Aftercare following surgery for neoplasm: Secondary | ICD-10-CM

## 2022-05-16 DIAGNOSIS — Z510 Encounter for antineoplastic radiation therapy: Secondary | ICD-10-CM | POA: Diagnosis not present

## 2022-05-16 DIAGNOSIS — M25611 Stiffness of right shoulder, not elsewhere classified: Secondary | ICD-10-CM

## 2022-05-16 LAB — RAD ONC ARIA SESSION SUMMARY
Course Elapsed Days: 9
Plan Fractions Treated to Date: 4
Plan Fractions Treated to Date: 7
Plan Prescribed Dose Per Fraction: 2 Gy
Plan Prescribed Dose Per Fraction: 2 Gy
Plan Total Fractions Prescribed: 13
Plan Total Fractions Prescribed: 25
Plan Total Prescribed Dose: 26 Gy
Plan Total Prescribed Dose: 50 Gy
Reference Point Dosage Given to Date: 14 Gy
Reference Point Dosage Given to Date: 14 Gy
Reference Point Session Dosage Given: 2 Gy
Reference Point Session Dosage Given: 2 Gy
Session Number: 7

## 2022-05-16 NOTE — Patient Instructions (Signed)
Access Code: [email or web address] URL: [email or web address] Date: 05/16/2022 Prepared by: Cheral Almas  Exercises - Scapular Retraction with Resistance  - 1 x daily - 3 x weekly - 1 sets - 5 reps - Scapular Retraction with Resistance Advanced  - 1 x daily - 3 x weekly - 1 sets - 5 reps - Shoulder External Rotation and Scapular Retraction with Resistance  - 1 x daily - 3 x weekly - 5 reps
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
## 2022-05-17 ENCOUNTER — Other Ambulatory Visit: Payer: Self-pay

## 2022-05-17 ENCOUNTER — Ambulatory Visit
Admission: RE | Admit: 2022-05-17 | Discharge: 2022-05-17 | Disposition: A | Discharge status: Home / Self Care | Payer: Medicaid Other | Source: Ambulatory Visit | Attending: Radiation Oncology | Admitting: Radiation Oncology

## 2022-05-17 DIAGNOSIS — C50412 Malignant neoplasm of upper-outer quadrant of left female breast: Secondary | ICD-10-CM | POA: Diagnosis present

## 2022-05-17 DIAGNOSIS — Z170 Estrogen receptor positive status [ER+]: Secondary | ICD-10-CM | POA: Diagnosis present

## 2022-05-17 LAB — RAD ONC ARIA SESSION SUMMARY
Course Elapsed Days: 10
Plan Fractions Treated to Date: 4
Plan Fractions Treated to Date: 8
Plan Prescribed Dose Per Fraction: 2 Gy
Plan Prescribed Dose Per Fraction: 2 Gy
Plan Total Fractions Prescribed: 12
Plan Total Fractions Prescribed: 25
Plan Total Prescribed Dose: 24 Gy
Plan Total Prescribed Dose: 50 Gy
Reference Point Dosage Given to Date: 16 Gy
Reference Point Dosage Given to Date: 16 Gy
Reference Point Session Dosage Given: 2 Gy
Reference Point Session Dosage Given: 2 Gy
Session Number: 8

## 2022-05-18 ENCOUNTER — Other Ambulatory Visit: Payer: Self-pay

## 2022-05-18 ENCOUNTER — Ambulatory Visit
Admission: RE | Admit: 2022-05-18 | Discharge: 2022-05-18 | Disposition: A | Discharge status: Home / Self Care | Payer: Medicaid Other | Source: Ambulatory Visit | Attending: Radiation Oncology | Admitting: Radiation Oncology

## 2022-05-18 DIAGNOSIS — C50412 Malignant neoplasm of upper-outer quadrant of left female breast: Secondary | ICD-10-CM | POA: Diagnosis not present

## 2022-05-18 LAB — RAD ONC ARIA SESSION SUMMARY
Course Elapsed Days: 11
Plan Fractions Treated to Date: 5
Plan Fractions Treated to Date: 9
Plan Prescribed Dose Per Fraction: 2 Gy
Plan Prescribed Dose Per Fraction: 2 Gy
Plan Total Fractions Prescribed: 13
Plan Total Fractions Prescribed: 25
Plan Total Prescribed Dose: 26 Gy
Plan Total Prescribed Dose: 50 Gy
Reference Point Dosage Given to Date: 18 Gy
Reference Point Dosage Given to Date: 18 Gy
Reference Point Session Dosage Given: 2 Gy
Reference Point Session Dosage Given: 2 Gy
Session Number: 9

## 2022-05-21 ENCOUNTER — Other Ambulatory Visit: Payer: Self-pay

## 2022-05-21 ENCOUNTER — Ambulatory Visit
Admission: RE | Admit: 2022-05-21 | Discharge: 2022-05-21 | Disposition: A | Discharge status: Home / Self Care | Payer: Medicaid Other | Source: Ambulatory Visit | Attending: Radiation Oncology | Admitting: Radiation Oncology

## 2022-05-21 ENCOUNTER — Ambulatory Visit: Payer: Medicaid Other

## 2022-05-21 DIAGNOSIS — C50412 Malignant neoplasm of upper-outer quadrant of left female breast: Secondary | ICD-10-CM | POA: Diagnosis not present

## 2022-05-21 LAB — RAD ONC ARIA SESSION SUMMARY
Course Elapsed Days: 14
Plan Fractions Treated to Date: 10
Plan Fractions Treated to Date: 5
Plan Prescribed Dose Per Fraction: 2 Gy
Plan Prescribed Dose Per Fraction: 2 Gy
Plan Total Fractions Prescribed: 12
Plan Total Fractions Prescribed: 25
Plan Total Prescribed Dose: 24 Gy
Plan Total Prescribed Dose: 50 Gy
Reference Point Dosage Given to Date: 20 Gy
Reference Point Dosage Given to Date: 20 Gy
Reference Point Session Dosage Given: 2 Gy
Reference Point Session Dosage Given: 2 Gy
Session Number: 10

## 2022-05-22 ENCOUNTER — Ambulatory Visit: Payer: Medicaid Other

## 2022-05-22 ENCOUNTER — Other Ambulatory Visit: Payer: Self-pay

## 2022-05-22 ENCOUNTER — Ambulatory Visit
Admission: RE | Admit: 2022-05-22 | Discharge: 2022-05-22 | Disposition: A | Discharge status: Home / Self Care | Payer: Medicaid Other | Source: Ambulatory Visit | Attending: Radiation Oncology | Admitting: Radiation Oncology

## 2022-05-22 DIAGNOSIS — C50412 Malignant neoplasm of upper-outer quadrant of left female breast: Secondary | ICD-10-CM | POA: Diagnosis not present

## 2022-05-22 LAB — RAD ONC ARIA SESSION SUMMARY
Course Elapsed Days: 15
Plan Fractions Treated to Date: 11
Plan Fractions Treated to Date: 6
Plan Prescribed Dose Per Fraction: 2 Gy
Plan Prescribed Dose Per Fraction: 2 Gy
Plan Total Fractions Prescribed: 13
Plan Total Fractions Prescribed: 25
Plan Total Prescribed Dose: 26 Gy
Plan Total Prescribed Dose: 50 Gy
Reference Point Dosage Given to Date: 22 Gy
Reference Point Dosage Given to Date: 22 Gy
Reference Point Session Dosage Given: 2 Gy
Reference Point Session Dosage Given: 2 Gy
Session Number: 11

## 2022-05-23 ENCOUNTER — Ambulatory Visit: Payer: Medicaid Other | Attending: Hematology and Oncology | Admitting: Rehabilitation

## 2022-05-23 ENCOUNTER — Encounter: Payer: Self-pay | Admitting: Rehabilitation

## 2022-05-23 ENCOUNTER — Ambulatory Visit
Admission: RE | Admit: 2022-05-23 | Discharge: 2022-05-23 | Disposition: A | Discharge status: Home / Self Care | Payer: Medicaid Other | Source: Ambulatory Visit | Attending: Radiation Oncology | Admitting: Radiation Oncology

## 2022-05-23 ENCOUNTER — Other Ambulatory Visit: Payer: Self-pay

## 2022-05-23 DIAGNOSIS — Z170 Estrogen receptor positive status [ER+]: Secondary | ICD-10-CM | POA: Insufficient documentation

## 2022-05-23 DIAGNOSIS — M25612 Stiffness of left shoulder, not elsewhere classified: Secondary | ICD-10-CM | POA: Diagnosis present

## 2022-05-23 DIAGNOSIS — M25611 Stiffness of right shoulder, not elsewhere classified: Secondary | ICD-10-CM | POA: Insufficient documentation

## 2022-05-23 DIAGNOSIS — C50812 Malignant neoplasm of overlapping sites of left female breast: Secondary | ICD-10-CM | POA: Diagnosis present

## 2022-05-23 DIAGNOSIS — C50412 Malignant neoplasm of upper-outer quadrant of left female breast: Secondary | ICD-10-CM | POA: Diagnosis not present

## 2022-05-23 DIAGNOSIS — R293 Abnormal posture: Secondary | ICD-10-CM | POA: Insufficient documentation

## 2022-05-23 DIAGNOSIS — Z483 Aftercare following surgery for neoplasm: Secondary | ICD-10-CM | POA: Diagnosis present

## 2022-05-23 LAB — RAD ONC ARIA SESSION SUMMARY
Course Elapsed Days: 16
Plan Fractions Treated to Date: 12
Plan Fractions Treated to Date: 6
Plan Prescribed Dose Per Fraction: 2 Gy
Plan Prescribed Dose Per Fraction: 2 Gy
Plan Total Fractions Prescribed: 12
Plan Total Fractions Prescribed: 25
Plan Total Prescribed Dose: 24 Gy
Plan Total Prescribed Dose: 50 Gy
Reference Point Dosage Given to Date: 24 Gy
Reference Point Dosage Given to Date: 24 Gy
Reference Point Session Dosage Given: 2 Gy
Reference Point Session Dosage Given: 2 Gy
Session Number: 12
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
## 2022-05-24 ENCOUNTER — Other Ambulatory Visit: Payer: Self-pay

## 2022-05-24 ENCOUNTER — Ambulatory Visit
Admission: RE | Admit: 2022-05-24 | Discharge: 2022-05-24 | Disposition: A | Discharge status: Home / Self Care | Payer: Medicaid Other | Source: Ambulatory Visit | Attending: Radiation Oncology | Admitting: Radiation Oncology

## 2022-05-24 DIAGNOSIS — C50412 Malignant neoplasm of upper-outer quadrant of left female breast: Secondary | ICD-10-CM | POA: Diagnosis not present

## 2022-05-24 LAB — RAD ONC ARIA SESSION SUMMARY
Course Elapsed Days: 17
Plan Fractions Treated to Date: 13
Plan Fractions Treated to Date: 7
Plan Prescribed Dose Per Fraction: 2 Gy
Plan Prescribed Dose Per Fraction: 2 Gy
Plan Total Fractions Prescribed: 13
Plan Total Fractions Prescribed: 25
Plan Total Prescribed Dose: 26 Gy
Plan Total Prescribed Dose: 50 Gy
Reference Point Dosage Given to Date: 26 Gy
Reference Point Dosage Given to Date: 26 Gy
Reference Point Session Dosage Given: 2 Gy
Reference Point Session Dosage Given: 2 Gy
Session Number: 13

## 2022-05-25 ENCOUNTER — Ambulatory Visit
Admission: RE | Admit: 2022-05-25 | Discharge: 2022-05-25 | Disposition: A | Discharge status: Home / Self Care | Payer: Medicaid Other | Source: Ambulatory Visit | Attending: Radiation Oncology | Admitting: Radiation Oncology

## 2022-05-25 ENCOUNTER — Other Ambulatory Visit: Payer: Self-pay

## 2022-05-25 DIAGNOSIS — C50412 Malignant neoplasm of upper-outer quadrant of left female breast: Secondary | ICD-10-CM | POA: Diagnosis not present

## 2022-05-25 LAB — RAD ONC ARIA SESSION SUMMARY
Course Elapsed Days: 18
Plan Fractions Treated to Date: 14
Plan Fractions Treated to Date: 7
Plan Prescribed Dose Per Fraction: 2 Gy
Plan Prescribed Dose Per Fraction: 2 Gy
Plan Total Fractions Prescribed: 12
Plan Total Fractions Prescribed: 25
Plan Total Prescribed Dose: 24 Gy
Plan Total Prescribed Dose: 50 Gy
Reference Point Dosage Given to Date: 28 Gy
Reference Point Dosage Given to Date: 28 Gy
Reference Point Session Dosage Given: 2 Gy
Reference Point Session Dosage Given: 2 Gy
Session Number: 14

## 2022-05-28 ENCOUNTER — Ambulatory Visit
Admission: RE | Admit: 2022-05-28 | Discharge: 2022-05-28 | Disposition: A | Discharge status: Home / Self Care | Payer: Medicaid Other | Source: Ambulatory Visit | Attending: Radiation Oncology | Admitting: Radiation Oncology

## 2022-05-28 ENCOUNTER — Other Ambulatory Visit: Payer: Self-pay

## 2022-05-28 DIAGNOSIS — C50412 Malignant neoplasm of upper-outer quadrant of left female breast: Secondary | ICD-10-CM | POA: Diagnosis not present

## 2022-05-28 LAB — RAD ONC ARIA SESSION SUMMARY
Course Elapsed Days: 21
Plan Fractions Treated to Date: 15
Plan Fractions Treated to Date: 8
Plan Prescribed Dose Per Fraction: 2 Gy
Plan Prescribed Dose Per Fraction: 2 Gy
Plan Total Fractions Prescribed: 13
Plan Total Fractions Prescribed: 25
Plan Total Prescribed Dose: 26 Gy
Plan Total Prescribed Dose: 50 Gy
Reference Point Dosage Given to Date: 30 Gy
Reference Point Dosage Given to Date: 30 Gy
Reference Point Session Dosage Given: 2 Gy
Reference Point Session Dosage Given: 2 Gy
Session Number: 15

## 2022-05-29 ENCOUNTER — Ambulatory Visit
Admission: RE | Admit: 2022-05-29 | Discharge: 2022-05-29 | Disposition: A | Discharge status: Home / Self Care | Payer: Medicaid Other | Source: Ambulatory Visit | Attending: Radiation Oncology | Admitting: Radiation Oncology

## 2022-05-29 ENCOUNTER — Other Ambulatory Visit: Payer: Self-pay

## 2022-05-29 DIAGNOSIS — C50412 Malignant neoplasm of upper-outer quadrant of left female breast: Secondary | ICD-10-CM | POA: Diagnosis not present

## 2022-05-29 LAB — RAD ONC ARIA SESSION SUMMARY
Course Elapsed Days: 22
Plan Fractions Treated to Date: 16
Plan Fractions Treated to Date: 8
Plan Prescribed Dose Per Fraction: 2 Gy
Plan Prescribed Dose Per Fraction: 2 Gy
Plan Total Fractions Prescribed: 12
Plan Total Fractions Prescribed: 25
Plan Total Prescribed Dose: 24 Gy
Plan Total Prescribed Dose: 50 Gy
Reference Point Dosage Given to Date: 32 Gy
Reference Point Dosage Given to Date: 32 Gy
Reference Point Session Dosage Given: 2 Gy
Reference Point Session Dosage Given: 2 Gy
Session Number: 16

## 2022-05-30 ENCOUNTER — Ambulatory Visit: Payer: Medicaid Other | Admitting: Rehabilitation

## 2022-05-30 ENCOUNTER — Encounter: Payer: Self-pay | Admitting: Rehabilitation

## 2022-05-30 ENCOUNTER — Other Ambulatory Visit: Payer: Self-pay

## 2022-05-30 ENCOUNTER — Ambulatory Visit
Admission: RE | Admit: 2022-05-30 | Discharge: 2022-05-30 | Disposition: A | Discharge status: Home / Self Care | Payer: Medicaid Other | Source: Ambulatory Visit | Attending: Radiation Oncology | Admitting: Radiation Oncology

## 2022-05-30 DIAGNOSIS — C50812 Malignant neoplasm of overlapping sites of left female breast: Secondary | ICD-10-CM | POA: Diagnosis not present

## 2022-05-30 DIAGNOSIS — Z170 Estrogen receptor positive status [ER+]: Secondary | ICD-10-CM

## 2022-05-30 DIAGNOSIS — R293 Abnormal posture: Secondary | ICD-10-CM

## 2022-05-30 DIAGNOSIS — M25611 Stiffness of right shoulder, not elsewhere classified: Secondary | ICD-10-CM

## 2022-05-30 DIAGNOSIS — Z483 Aftercare following surgery for neoplasm: Secondary | ICD-10-CM

## 2022-05-30 DIAGNOSIS — M25612 Stiffness of left shoulder, not elsewhere classified: Secondary | ICD-10-CM

## 2022-05-30 DIAGNOSIS — C50412 Malignant neoplasm of upper-outer quadrant of left female breast: Secondary | ICD-10-CM | POA: Diagnosis not present

## 2022-05-30 LAB — RAD ONC ARIA SESSION SUMMARY
Course Elapsed Days: 23
Plan Fractions Treated to Date: 17
Plan Fractions Treated to Date: 9
Plan Prescribed Dose Per Fraction: 2 Gy
Plan Prescribed Dose Per Fraction: 2 Gy
Plan Total Fractions Prescribed: 13
Plan Total Fractions Prescribed: 25
Plan Total Prescribed Dose: 26 Gy
Plan Total Prescribed Dose: 50 Gy
Reference Point Dosage Given to Date: 34 Gy
Reference Point Dosage Given to Date: 34 Gy
Reference Point Session Dosage Given: 2 Gy
Reference Point Session Dosage Given: 2 Gy
Session Number: 17
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
## 2022-05-31 ENCOUNTER — Ambulatory Visit
Admission: RE | Admit: 2022-05-31 | Discharge: 2022-05-31 | Disposition: A | Discharge status: Home / Self Care | Payer: Medicaid Other | Source: Ambulatory Visit | Attending: Radiation Oncology | Admitting: Radiation Oncology

## 2022-05-31 ENCOUNTER — Other Ambulatory Visit: Payer: Self-pay

## 2022-05-31 DIAGNOSIS — C50412 Malignant neoplasm of upper-outer quadrant of left female breast: Secondary | ICD-10-CM | POA: Diagnosis not present

## 2022-05-31 LAB — RAD ONC ARIA SESSION SUMMARY
Course Elapsed Days: 24
Plan Fractions Treated to Date: 18
Plan Fractions Treated to Date: 9
Plan Prescribed Dose Per Fraction: 2 Gy
Plan Prescribed Dose Per Fraction: 2 Gy
Plan Total Fractions Prescribed: 12
Plan Total Fractions Prescribed: 25
Plan Total Prescribed Dose: 24 Gy
Plan Total Prescribed Dose: 50 Gy
Reference Point Dosage Given to Date: 36 Gy
Reference Point Dosage Given to Date: 36 Gy
Reference Point Session Dosage Given: 2 Gy
Reference Point Session Dosage Given: 2 Gy
Session Number: 18

## 2022-05-31 NOTE — Progress Notes (Signed)
Patient Care Team: Elsie Stain, MD as PCP - General (Pulmonary Disease) Rockwell Germany, RN as Oncology Nurse Navigator Mauro Kaufmann, RN as Oncology Nurse Navigator Nicholas Lose, MD as Consulting Physician (Hematology and Oncology) Donnie Mesa, MD as Consulting Physician (General Surgery)  DIAGNOSIS:  Encounter Diagnosis  Name Primary?   Malignant neoplasm of upper-outer quadrant of left breast in female, estrogen receptor positive ([place])     SUMMARY OF ONCOLOGIC HISTORY: Oncology History  Malignant neoplasm of upper-outer quadrant of left breast in female, estrogen receptor positive ([place])  10/04/2021 Initial Diagnosis   Palpable left breast mass and left breast swelling for 2 months, mammogram revealed 9 cm abnormality at 2 o'clock position plus another mass at 8:00 measuring 1.3 cm: Biopsy of both revealed grade 3 IDC ER 95%, PR 0%, HER2 negative, Ki-67 40%, 5 abnormal lymph nodes: Biopsy of 1 was positive (rib metastases seen on mammogram)   10/11/2021 Cancer Staging   Staging form: Breast, AJCC 8th Edition - Clinical stage from 10/11/2021: Stage IV (cT3, cN1, cM1, G3, ER+, PR-, HER2-) - Signed by Nicholas Lose, MD on 10/11/2021 Stage prefix: Initial diagnosis Histologic grading system: 3 grade system   10/20/2021 Genetic Testing   Negative hereditary cancer genetic testing: no pathogenic variants detected in [facility] Breast Cancer STAT Panel or Multi-Cancer +RNA Panel.  Variants of uncertain significance detected in POT1 at c.476T>C (p.Met159Thr) and PRKAR1A at c.27T>G (p.Ser9Arg).  The report dates are October 19, 2021 and October 29, 2021, respectively.   The Multi-Cancer + RNA Panel offered by [facility] includes sequencing and/or deletion/duplication analysis of the following 84 genes:  AIP*, ALK, APC*, ATM*, AXIN2*, BAP1*, BARD1*, BLM*, BMPR1A*, BRCA1*, BRCA2*, BRIP1*, CASR, CDC73*, CDH1*, CDK4, CDKN1B*, CDKN1C*, CDKN2A, CEBPA, CHEK2*, CTNNA1*, DICER1*, DIS3L2*,  EGFR, EPCAM, FH*, FLCN*, GATA2*, GPC3, GREM1, HOXB13, HRAS, KIT, MAX*, MEN1*, MET, MITF, MLH1*, MSH2*, MSH3*, MSH6*, MUTYH*, NBN*, NF1*, NF2*, NTHL1*, PALB2*, PDGFRA, PHOX2B, PMS2*, POLD1*, POLE*, POT1*, PRKAR1A*, PTCH1*, PTEN*, RAD50*, RAD51C*, RAD51D*, RB1*, RECQL4, RET, RUNX1*, SDHA*, SDHAF2*, SDHB*, SDHC*, SDHD*, SMAD4*, SMARCA4*, SMARCB1*, SMARCE1*, STK11*, SUFU*, TERC, TERT, TMEM127*, Tp53*, TSC1*, TSC2*, VHL*, WRN*, and WT1.  RNA analysis is performed for * genes.   11/01/2021 - 03/12/2022 Chemotherapy   Patient is on Treatment Plan : BREAST ADJUVANT DOSE DENSE AC q14d / PACLitaxel q7d     04/04/2022 Surgery   Bilateral mastectomies Right mastectomy: Benign Left mastectomy: Negative for residual cancer (pathologic complete response)     CHIEF COMPLIANT: Follow-up after radiation anti estrogen therapy  INTERVAL HISTORY: Alicia Morton is a  41 y.o. with above-mentioned history of breast cancer currently on chemotherapy with Taxol. She presents to the clinic today for a follow-up on anti estrogen therapy. States that she does have mild hot flashes already.    ALLERGIES:  has No Known Allergies.  MEDICATIONS:  Current Outpatient Medications  Medication Sig Dispense Refill   tamoxifen (NOLVADEX) 20 MG tablet Take 1 tablet (20 mg total) by mouth daily. 90 tablet 3   albuterol (PROVENTIL) (2.5 MG/3ML) 0.083% nebulizer solution Take 3 mLs (2.5 mg total) by nebulization every 6 (six) hours as needed for wheezing or shortness of breath. 150 mL 1   albuterol (VENTOLIN HFA) 108 (90 Base) MCG/ACT inhaler Inhale 1-2 puffs into the lungs every 6 (six) hours as needed for wheezing or shortness of breath. 18 g 0   No current facility-administered medications for this visit.   Facility-Administered Medications Ordered in Other Visits  Medication Dose Route Frequency Provider  Last Rate Last Admin   sodium chloride flush (NS) 0.9 % injection 10 mL  10 mL Intracatheter Once Nicholas Lose, MD         PHYSICAL EXAMINATION: ECOG PERFORMANCE STATUS: 1 - Symptomatic but completely ambulatory  Vitals:   06/14/22 1510  BP: 136/68  Pulse: 85  Resp: 18  Temp: 97.8 F (36.6 C)  SpO2: 100%   Filed Weights   06/14/22 1510  Weight: 230 lb 6.4 oz (104.5 kg)      LABORATORY DATA:  I have reviewed the data as listed    Latest Ref Rng & Units 04/05/2022    2:17 AM 04/02/2022    9:48 AM 03/23/2022    2:11 PM  CMP  Glucose 70 - 99 mg/dL 110  90  82   BUN 6 - 20 mg/dL $Remove'10  11  9   'XJflZEL$ Creatinine 0.44 - 1.00 mg/dL 0.60  0.53  0.55   Sodium 135 - 145 mmol/L 139  138  139   Potassium 3.5 - 5.1 mmol/L 3.9  3.8  3.5   Chloride 98 - 111 mmol/L 107  105  106   CO2 22 - 32 mmol/L $RemoveB'25  28  27   'wWwkWULz$ Calcium 8.9 - 10.3 mg/dL 8.8  9.0  9.0   Total Protein 6.5 - 8.1 g/dL  7.1  7.0   Total Bilirubin 0.3 - 1.2 mg/dL  0.4  0.6   Alkaline Phos 38 - 126 U/L  67  71   AST 15 - 41 U/L  14  15   ALT 0 - 44 U/L  10  11     Lab Results  Component Value Date   WBC 11.1 (H) 04/05/2022   HGB 9.2 (L) 04/05/2022   HCT 29.6 (L) 04/05/2022   MCV 85.8 04/05/2022   PLT 372 04/05/2022   NEUTROABS 4.8 04/02/2022    ASSESSMENT & PLAN:  Malignant neoplasm of upper-outer quadrant of left breast in female, estrogen receptor positive ([employer]) 10/04/2021: Palpable left breast mass and left breast swelling for 2 months, mammogram revealed 9 cm abnormality at 2 o'clock position plus another mass at 8:00 measuring 1.3 cm: 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 Bone scan 10/23/2021: No evidence of bony metastases.  Left mandible activity benign   Treatment plan: 1.  Neoadjuvant chemotherapy with dose dense Adriamycin and Cytoxan followed by Taxol completed 03/23/2022 2. 04/04/2022:Bilateral  mastectomies Right mastectomy: Benign Left mastectomy: Negative for residual cancer (pathologic complete response) Dr. Georgette Dover removed a lot of tissue from the axilla but there were no viable lymph nodes. 3.  Adjuvant radiation 05/08/22- 06/11/22 4.  Adjuvant antiestrogen therapy with ovarian function suppression plus + AI + Verzinio (we will discuss this option versus tamoxifen) ------------------------------------------------------------------------------------------------------------------------------------------- Anti estrogen therapy counseling: OFS + AI+ vs tamoxifen with or without Verzinio. Since patient had a complete pathologic response I believe that tamoxifen alone may be adequate.  Tamoxifen counseling:We discussed the risks and benefits of tamoxifen. These include but not limited to insomnia, hot flashes, mood changes, vaginal dryness, and weight gain. Although rare, serious side effects including endometrial cancer, risk of blood clots were also discussed. We strongly believe that the benefits far outweigh the risks. Patient understands these risks and consented to starting treatment.  Planned treatment duration is 10 years.     Chemo-induced peripheral neuropathy: Monitoring   Return to clinic in 3 months for survivorship care plan visit    No orders of the defined types were placed in this encounter.  The patient has a good understanding of the overall plan. she agrees with it. she will call with any problems that may develop before the next visit here. Total time spent: 30 mins including face to face time and time spent for planning, charting and co-ordination of care   Harriette Ohara, MD 06/14/22    I Gardiner Coins am scribing for Dr. Lindi Adie  I have reviewed the above documentation for accuracy and completeness, and I agree with the above.

## 2022-06-01 ENCOUNTER — Other Ambulatory Visit: Payer: Self-pay

## 2022-06-01 ENCOUNTER — Ambulatory Visit
Admission: RE | Admit: 2022-06-01 | Discharge: 2022-06-01 | Disposition: A | Discharge status: Home / Self Care | Payer: Medicaid Other | Source: Ambulatory Visit | Attending: Radiation Oncology | Admitting: Radiation Oncology

## 2022-06-01 DIAGNOSIS — C50412 Malignant neoplasm of upper-outer quadrant of left female breast: Secondary | ICD-10-CM | POA: Diagnosis not present

## 2022-06-01 LAB — RAD ONC ARIA SESSION SUMMARY
Course Elapsed Days: 25
Plan Fractions Treated to Date: 10
Plan Fractions Treated to Date: 19
Plan Prescribed Dose Per Fraction: 2 Gy
Plan Prescribed Dose Per Fraction: 2 Gy
Plan Total Fractions Prescribed: 13
Plan Total Fractions Prescribed: 25
Plan Total Prescribed Dose: 26 Gy
Plan Total Prescribed Dose: 50 Gy
Reference Point Dosage Given to Date: 38 Gy
Reference Point Dosage Given to Date: 38 Gy
Reference Point Session Dosage Given: 2 Gy
Reference Point Session Dosage Given: 2 Gy
Session Number: 19

## 2022-06-04 ENCOUNTER — Ambulatory Visit: Payer: Medicaid Other

## 2022-06-04 ENCOUNTER — Ambulatory Visit
Admission: RE | Admit: 2022-06-04 | Discharge: 2022-06-04 | Disposition: A | Discharge status: Home / Self Care | Payer: Medicaid Other | Source: Ambulatory Visit | Attending: Radiation Oncology | Admitting: Radiation Oncology

## 2022-06-04 ENCOUNTER — Other Ambulatory Visit: Payer: Self-pay

## 2022-06-04 DIAGNOSIS — Z170 Estrogen receptor positive status [ER+]: Secondary | ICD-10-CM

## 2022-06-04 DIAGNOSIS — C50412 Malignant neoplasm of upper-outer quadrant of left female breast: Secondary | ICD-10-CM | POA: Diagnosis not present

## 2022-06-04 LAB — RAD ONC ARIA SESSION SUMMARY
Course Elapsed Days: 28
Plan Fractions Treated to Date: 10
Plan Fractions Treated to Date: 20
Plan Prescribed Dose Per Fraction: 2 Gy
Plan Prescribed Dose Per Fraction: 2 Gy
Plan Total Fractions Prescribed: 12
Plan Total Fractions Prescribed: 25
Plan Total Prescribed Dose: 24 Gy
Plan Total Prescribed Dose: 50 Gy
Reference Point Dosage Given to Date: 40 Gy
Reference Point Dosage Given to Date: 40 Gy
Reference Point Session Dosage Given: 2 Gy
Reference Point Session Dosage Given: 2 Gy
Session Number: 20

## 2022-06-04 MED ORDER — RADIAPLEXRX EX GEL: Freq: Once | CUTANEOUS | Status: AC

## 2022-06-05 ENCOUNTER — Other Ambulatory Visit: Payer: Self-pay

## 2022-06-05 ENCOUNTER — Ambulatory Visit
Admission: RE | Admit: 2022-06-05 | Discharge: 2022-06-05 | Disposition: A | Discharge status: Home / Self Care | Payer: Medicaid Other | Source: Ambulatory Visit | Attending: Radiation Oncology | Admitting: Radiation Oncology

## 2022-06-05 ENCOUNTER — Encounter: Payer: Self-pay | Admitting: *Deleted

## 2022-06-05 DIAGNOSIS — C50412 Malignant neoplasm of upper-outer quadrant of left female breast: Secondary | ICD-10-CM | POA: Diagnosis not present

## 2022-06-05 DIAGNOSIS — Z170 Estrogen receptor positive status [ER+]: Secondary | ICD-10-CM

## 2022-06-05 LAB — RAD ONC ARIA SESSION SUMMARY
Course Elapsed Days: 29
Plan Fractions Treated to Date: 11
Plan Fractions Treated to Date: 21
Plan Prescribed Dose Per Fraction: 2 Gy
Plan Prescribed Dose Per Fraction: 2 Gy
Plan Total Fractions Prescribed: 13
Plan Total Fractions Prescribed: 25
Plan Total Prescribed Dose: 26 Gy
Plan Total Prescribed Dose: 50 Gy
Reference Point Dosage Given to Date: 42 Gy
Reference Point Dosage Given to Date: 42 Gy
Reference Point Session Dosage Given: 2 Gy
Reference Point Session Dosage Given: 2 Gy
Session Number: 21

## 2022-06-06 ENCOUNTER — Other Ambulatory Visit: Payer: Self-pay

## 2022-06-06 ENCOUNTER — Ambulatory Visit
Admission: RE | Admit: 2022-06-06 | Discharge: 2022-06-06 | Disposition: A | Discharge status: Home / Self Care | Payer: Medicaid Other | Source: Ambulatory Visit | Attending: Radiation Oncology | Admitting: Radiation Oncology

## 2022-06-06 DIAGNOSIS — C50412 Malignant neoplasm of upper-outer quadrant of left female breast: Secondary | ICD-10-CM | POA: Diagnosis not present

## 2022-06-06 LAB — RAD ONC ARIA SESSION SUMMARY
Course Elapsed Days: 30
Plan Fractions Treated to Date: 11
Plan Fractions Treated to Date: 22
Plan Prescribed Dose Per Fraction: 2 Gy
Plan Prescribed Dose Per Fraction: 2 Gy
Plan Total Fractions Prescribed: 12
Plan Total Fractions Prescribed: 25
Plan Total Prescribed Dose: 24 Gy
Plan Total Prescribed Dose: 50 Gy
Reference Point Dosage Given to Date: 44 Gy
Reference Point Dosage Given to Date: 44 Gy
Reference Point Session Dosage Given: 2 Gy
Reference Point Session Dosage Given: 2 Gy
Session Number: 22

## 2022-06-07 ENCOUNTER — Other Ambulatory Visit: Payer: Self-pay

## 2022-06-07 ENCOUNTER — Ambulatory Visit
Admission: RE | Admit: 2022-06-07 | Discharge: 2022-06-07 | Disposition: A | Discharge status: Home / Self Care | Payer: Medicaid Other | Source: Ambulatory Visit | Attending: Radiation Oncology | Admitting: Radiation Oncology

## 2022-06-07 DIAGNOSIS — C50412 Malignant neoplasm of upper-outer quadrant of left female breast: Secondary | ICD-10-CM | POA: Diagnosis not present

## 2022-06-07 LAB — RAD ONC ARIA SESSION SUMMARY
Course Elapsed Days: 31
Plan Fractions Treated to Date: 12
Plan Fractions Treated to Date: 23
Plan Prescribed Dose Per Fraction: 2 Gy
Plan Prescribed Dose Per Fraction: 2 Gy
Plan Total Fractions Prescribed: 13
Plan Total Fractions Prescribed: 25
Plan Total Prescribed Dose: 26 Gy
Plan Total Prescribed Dose: 50 Gy
Reference Point Dosage Given to Date: 46 Gy
Reference Point Dosage Given to Date: 46 Gy
Reference Point Session Dosage Given: 2 Gy
Reference Point Session Dosage Given: 2 Gy
Session Number: 23

## 2022-06-08 ENCOUNTER — Other Ambulatory Visit: Payer: Self-pay

## 2022-06-08 ENCOUNTER — Ambulatory Visit
Admission: RE | Admit: 2022-06-08 | Discharge: 2022-06-08 | Disposition: A | Discharge status: Home / Self Care | Payer: Medicaid Other | Source: Ambulatory Visit | Attending: Radiation Oncology | Admitting: Radiation Oncology

## 2022-06-08 DIAGNOSIS — C50412 Malignant neoplasm of upper-outer quadrant of left female breast: Secondary | ICD-10-CM | POA: Diagnosis not present

## 2022-06-08 LAB — RAD ONC ARIA SESSION SUMMARY
Course Elapsed Days: 32
Plan Fractions Treated to Date: 12
Plan Fractions Treated to Date: 24
Plan Prescribed Dose Per Fraction: 2 Gy
Plan Prescribed Dose Per Fraction: 2 Gy
Plan Total Fractions Prescribed: 12
Plan Total Fractions Prescribed: 25
Plan Total Prescribed Dose: 24 Gy
Plan Total Prescribed Dose: 50 Gy
Reference Point Dosage Given to Date: 48 Gy
Reference Point Dosage Given to Date: 48 Gy
Reference Point Session Dosage Given: 2 Gy
Reference Point Session Dosage Given: 2 Gy
Session Number: 24

## 2022-06-11 ENCOUNTER — Ambulatory Visit
Admission: RE | Admit: 2022-06-11 | Discharge: 2022-06-11 | Disposition: A | Discharge status: Home / Self Care | Payer: Medicaid Other | Source: Ambulatory Visit | Attending: Radiation Oncology | Admitting: Radiation Oncology

## 2022-06-11 ENCOUNTER — Other Ambulatory Visit: Payer: Self-pay

## 2022-06-11 ENCOUNTER — Encounter: Payer: Self-pay | Admitting: Radiation Oncology

## 2022-06-11 DIAGNOSIS — C50412 Malignant neoplasm of upper-outer quadrant of left female breast: Secondary | ICD-10-CM | POA: Diagnosis not present

## 2022-06-11 LAB — RAD ONC ARIA SESSION SUMMARY
Course Elapsed Days: 35
Plan Fractions Treated to Date: 13
Plan Fractions Treated to Date: 25
Plan Prescribed Dose Per Fraction: 2 Gy
Plan Prescribed Dose Per Fraction: 2 Gy
Plan Total Fractions Prescribed: 13
Plan Total Fractions Prescribed: 25
Plan Total Prescribed Dose: 26 Gy
Plan Total Prescribed Dose: 50 Gy
Reference Point Dosage Given to Date: 50 Gy
Reference Point Dosage Given to Date: 50 Gy
Reference Point Session Dosage Given: 2 Gy
Reference Point Session Dosage Given: 2 Gy
Session Number: 25

## 2022-06-11 MED ORDER — RADIAPLEXRX EX GEL: Freq: Once | CUTANEOUS | Status: AC

## 2022-06-14 ENCOUNTER — Other Ambulatory Visit: Payer: Self-pay

## 2022-06-14 ENCOUNTER — Inpatient Hospital Stay: Payer: Medicaid Other | Attending: Hematology and Oncology | Admitting: Hematology and Oncology

## 2022-06-14 DIAGNOSIS — Z9013 Acquired absence of bilateral breasts and nipples: Secondary | ICD-10-CM | POA: Diagnosis not present

## 2022-06-14 DIAGNOSIS — Z87891 Personal history of nicotine dependence: Secondary | ICD-10-CM | POA: Diagnosis not present

## 2022-06-14 DIAGNOSIS — Z79810 Long term (current) use of selective estrogen receptor modulators (SERMs): Secondary | ICD-10-CM | POA: Diagnosis not present

## 2022-06-14 DIAGNOSIS — C50412 Malignant neoplasm of upper-outer quadrant of left female breast: Secondary | ICD-10-CM | POA: Insufficient documentation

## 2022-06-14 DIAGNOSIS — T451X5A Adverse effect of antineoplastic and immunosuppressive drugs, initial encounter: Secondary | ICD-10-CM | POA: Diagnosis not present

## 2022-06-14 DIAGNOSIS — G620 Drug-induced polyneuropathy: Secondary | ICD-10-CM | POA: Diagnosis not present

## 2022-06-14 DIAGNOSIS — Z170 Estrogen receptor positive status [ER+]: Secondary | ICD-10-CM | POA: Diagnosis not present

## 2022-06-14 DIAGNOSIS — R232 Flushing: Secondary | ICD-10-CM | POA: Insufficient documentation

## 2022-06-14 MED ORDER — TAMOXIFEN CITRATE 20 MG PO TABS: 20.0000 mg | ORAL_TABLET | Freq: Every day | ORAL | 3 refills | Status: DC

## 2022-06-14 NOTE — Assessment & Plan Note (Addendum)
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 Bone scan 10/23/2021: No evidence of bony metastases. Left mandible activity benign  Treatment plan: 1.Neoadjuvant chemotherapy with dose dense Adriamycin and Cytoxan followed by Taxol completed 03/23/2022 2.04/04/2022:Bilateral mastectomies Right mastectomy: Benign Left mastectomy: Negative for residual cancer (pathologic complete response) Dr. Georgette Dover removed a lot of tissue from the axilla but there were no viable lymph nodes. 3.Adjuvant radiation 05/08/22- 06/11/22 4.Adjuvant antiestrogen therapy with ovarian function suppression plus + AI + Verzinio (we will discuss this option versus tamoxifen) ------------------------------------------------------------------------------------------------------------------------------------------- Anti estrogen therapy counseling: OFS + AI+ vs tamoxifen with or without Verzinio. Since patient had a complete pathologic response I believe that tamoxifen alone may be adequate.  Tamoxifen counseling:We discussed the risks and benefits of tamoxifen. These include but not limited to insomnia, hot flashes, mood changes, vaginal dryness, and weight gain. Although rare, serious side effects including endometrial cancer, risk of blood clots were also discussed. We strongly believe that the benefits far outweigh the risks. Patient understands these risks and consented to starting treatment. Planned treatment duration is 10 years.    Chemo-induced peripheral neuropathy: Monitoring  Return to clinic in 3 months for  survivorship care plan visit

## 2022-06-28 ENCOUNTER — Telehealth (HOSPITAL_COMMUNITY): Payer: Self-pay | Admitting: *Deleted

## 2022-06-28 ENCOUNTER — Other Ambulatory Visit (HOSPITAL_COMMUNITY): Payer: Self-pay | Admitting: *Deleted

## 2022-06-28 DIAGNOSIS — Z170 Estrogen receptor positive status [ER+]: Secondary | ICD-10-CM

## 2022-06-28 NOTE — Telephone Encounter (Signed)
Echo auth request faxed to [facility]

## 2022-07-02 ENCOUNTER — Ambulatory Visit (HOSPITAL_BASED_OUTPATIENT_CLINIC_OR_DEPARTMENT_OTHER)
Admission: RE | Admit: 2022-07-02 | Discharge: 2022-07-02 | Disposition: A | Discharge status: Home / Self Care | Payer: Medicaid Other | Source: Ambulatory Visit | Attending: Internal Medicine | Admitting: Internal Medicine

## 2022-07-02 ENCOUNTER — Ambulatory Visit (HOSPITAL_COMMUNITY)
Admission: RE | Admit: 2022-07-02 | Discharge: 2022-07-02 | Disposition: A | Discharge status: Home / Self Care | Payer: Medicaid Other | Source: Ambulatory Visit | Attending: Critical Care Medicine | Admitting: Critical Care Medicine

## 2022-07-02 ENCOUNTER — Ambulatory Visit: Payer: Medicaid Other | Attending: Hematology and Oncology

## 2022-07-02 ENCOUNTER — Encounter (HOSPITAL_COMMUNITY): Payer: Self-pay | Admitting: Internal Medicine

## 2022-07-02 VITALS — Wt 225.1 lb

## 2022-07-02 VITALS — BP 110/60 | HR 62 | Wt 228.8 lb

## 2022-07-02 DIAGNOSIS — Z170 Estrogen receptor positive status [ER+]: Secondary | ICD-10-CM

## 2022-07-02 DIAGNOSIS — R600 Localized edema: Secondary | ICD-10-CM

## 2022-07-02 DIAGNOSIS — Z0189 Encounter for other specified special examinations: Secondary | ICD-10-CM | POA: Diagnosis not present

## 2022-07-02 DIAGNOSIS — Z483 Aftercare following surgery for neoplasm: Secondary | ICD-10-CM | POA: Insufficient documentation

## 2022-07-02 DIAGNOSIS — C50412 Malignant neoplasm of upper-outer quadrant of left female breast: Secondary | ICD-10-CM | POA: Diagnosis not present

## 2022-07-02 DIAGNOSIS — Z09 Encounter for follow-up examination after completed treatment for conditions other than malignant neoplasm: Secondary | ICD-10-CM | POA: Insufficient documentation

## 2022-07-02 LAB — ECHOCARDIOGRAM COMPLETE
Area-P 1/2: 3.6 cm2
S' Lateral: 3.4 cm

## 2022-07-02 NOTE — Patient Instructions (Signed)
There has been no changes to your medications.  Your physician has requested that you have an echocardiogram. Echocardiography is a painless test that uses sound waves to create images of your heart. It provides your doctor with information about the size and shape of your heart and how well your heart's chambers and valves are working. This procedure takes approximately one hour. There are no restrictions for this procedure.  Your physician recommends that you schedule a follow-up appointment in: 1 year with an echocardiogram. ** please call the office in April 2024 to arrange your follow up appointment **  If you have any questions or concerns before your next appointment please send [place] a message through [place] or call our office at [phone].    TO LEAVE A MESSAGE FOR THE NURSE SELECT OPTION 2, PLEASE LEAVE A MESSAGE INCLUDING: YOUR NAME DATE OF BIRTH CALL BACK NUMBER REASON FOR CALL**this is important as we prioritize the call backs  YOU WILL RECEIVE A CALL BACK THE SAME DAY AS LONG AS YOU CALL BEFORE 4:00 PM  At the [facility], you and your health needs are our priority. As part of our continuing mission to provide you with exceptional heart care, we have created designated Provider Care Teams. These Care Teams include your primary Cardiologist (physician) and Advanced Practice Providers (APPs- Physician Assistants and Nurse Practitioners) who all work together to provide you with the care you need, when you need it.   You may see any of the following providers on your designated Care Team at your next follow up: Dr Glori Bickers Dr Haynes Kerns, NP Lyda Jester, [place] [facility] [place], [place] Audry Riles, PharmD   Please be sure to bring in all your medications bottles to every appointment.
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
## 2022-07-12 ENCOUNTER — Telehealth: Payer: Self-pay

## 2022-07-12 NOTE — Telephone Encounter (Signed)
I called the patient today about her upcoming follow-up appointment in radiation oncology.   Given the state of the COVID-19 pandemic, concerning case numbers in our community, and guidance from [facility], I offered a phone assessment with the patient to determine if coming to the clinic was necessary. She accepted.  The patient denies any symptomatic concerns.  She reports occasional fatigue, but overall states her energy levels have returned to her baseline. She denies any lingering pain, swelling or range of motion limitations to her left side. Specifically, she reports good healing of her skin in the radiation fields. She did have peeling in her axilla region after completing radiation, but reports skin is now healed and intact. I recommended that she continue skin care by applying oil or lotion with vitamin E to the skin in the radiation fields, BID, for 2 more months.    Continue follow-up with medical oncology. She saw Dr. Lindi Adie on 06/14/22 and will have F/U with Annabelle Harman in the [facility] on 09/10/22. I explained that yearly mammograms are important for patients with intact breast tissue, and physical exams are important after mastectomy for patients that cannot undergo mammography.  I encouraged her to call if she had further questions or concerns about her healing. Otherwise, she will follow-up PRN in radiation oncology. Patient is pleased with this plan, and we will cancel her upcoming follow-up to reduce the risk of COVID-19 transmission.

## 2022-07-13 ENCOUNTER — Ambulatory Visit: Payer: Medicaid Other | Admitting: Radiation Oncology

## 2022-07-30 ENCOUNTER — Telehealth: Payer: Self-pay | Admitting: *Deleted

## 2022-07-30 NOTE — Telephone Encounter (Signed)
Received call from pt requesting advice from MD if okay to proceed with routine dental cleaning now that tx has completed. Per MD okay to proceed, pt verbalized understanding and appreciative of advice.

## 2022-08-03 ENCOUNTER — Encounter: Payer: Self-pay | Admitting: Hematology and Oncology

## 2022-08-03 NOTE — Progress Notes (Signed)
                                                                                                                                                             Patient Name: Alicia Morton MRN: [id number] DOB: June 26, 1981 Referring Physician: Nicholas Lose (Profile Not Attached) Date of Service: 06/11/2022  [facility] [place]                                                        End Of Treatment Note  Diagnoses: C50.412-Malignant neoplasm of upper-outer quadrant of left female breast  Cancer Staging:  Cancer Staging  Malignant neoplasm of upper-outer quadrant of left breast in female, estrogen receptor positive ([place]) Staging form: Breast, AJCC 8th Edition - Clinical stage from 10/11/2021: Stage IV (cT3, cN1, cM1, G3, ER+, PR-, HER2-) - Signed by Nicholas Lose, MD on 10/11/2021 Stage prefix: Initial diagnosis Histologic grading system: 3 grade system   Intent: Curative  Radiation Treatment Dates: 05/07/2022 through 06/11/2022 Site Technique Total Dose (Gy) Dose per Fx (Gy) Completed Fx Beam Energies  Chest Wall, Left: CW_L 3D 50/50 2 25/25 10X  Chest Wall, Left: CW_L_SCV_PAB 3D 50/50 2 25/25 10X   Narrative: The patient tolerated radiation therapy relatively well.   Plan: The patient will follow-up with radiation oncology in 74mo.  -----------------------------------  Eppie Gibson, MD

## 2022-09-10 ENCOUNTER — Inpatient Hospital Stay: Payer: Medicaid Other | Attending: Hematology and Oncology | Admitting: Adult Health

## 2022-09-10 ENCOUNTER — Other Ambulatory Visit: Payer: Self-pay

## 2022-09-10 VITALS — BP 126/67 | HR 92 | Temp 97.7°F | Resp 18 | Ht 70.0 in | Wt 228.6 lb

## 2022-09-10 DIAGNOSIS — Z79810 Long term (current) use of selective estrogen receptor modulators (SERMs): Secondary | ICD-10-CM | POA: Diagnosis not present

## 2022-09-10 DIAGNOSIS — Z170 Estrogen receptor positive status [ER+]: Secondary | ICD-10-CM | POA: Diagnosis present

## 2022-09-10 DIAGNOSIS — C50412 Malignant neoplasm of upper-outer quadrant of left female breast: Secondary | ICD-10-CM | POA: Diagnosis present

## 2022-09-10 DIAGNOSIS — Z87891 Personal history of nicotine dependence: Secondary | ICD-10-CM | POA: Diagnosis not present
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
## 2022-09-11 ENCOUNTER — Encounter: Payer: Self-pay | Admitting: Adult Health

## 2022-09-11 ENCOUNTER — Encounter: Payer: Medicaid Other | Admitting: Adult Health

## 2022-09-11 ENCOUNTER — Other Ambulatory Visit: Payer: Self-pay

## 2022-09-11 DIAGNOSIS — C50412 Malignant neoplasm of upper-outer quadrant of left female breast: Secondary | ICD-10-CM

## 2022-09-11 NOTE — Progress Notes (Signed)
Orders entered for signatera testing per MD. Requisition and all supporting documents faxed to [phone] with fax confirmation.   

## 2022-10-05 ENCOUNTER — Telehealth: Payer: Self-pay | Admitting: *Deleted

## 2022-10-05 LAB — SIGNATERA ONLY (NATERA MANAGED)
SIGNATERA MTM READOUT: 0 MTM/ml
SIGNATERA TEST RESULT: NEGATIVE

## 2022-10-05 NOTE — Telephone Encounter (Signed)
Called pt with Signatera results with negative circulation cells. Pt was appreciative and verbalized understanding.

## 2022-10-08 ENCOUNTER — Ambulatory Visit: Payer: Medicaid Other | Attending: Hematology and Oncology

## 2022-10-08 VITALS — Wt 227.1 lb

## 2022-10-08 DIAGNOSIS — Z483 Aftercare following surgery for neoplasm: Secondary | ICD-10-CM | POA: Insufficient documentation
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
## 2022-11-05 ENCOUNTER — Ambulatory Visit: Payer: Medicaid Other | Admitting: Advanced Practice Midwife

## 2022-11-13 ENCOUNTER — Telehealth: Payer: Self-pay | Admitting: *Deleted

## 2022-11-13 NOTE — Telephone Encounter (Signed)
Received call from pt with complaint of rash on upper back.  Pt denise recent change in laundry soap.  Per MD pt not under active tx and needed to f/u with PCP.  Pt educated and verbalized understanding.

## 2022-12-03 ENCOUNTER — Inpatient Hospital Stay: Payer: Medicaid Other | Attending: Hematology and Oncology | Admitting: Hematology and Oncology

## 2022-12-03 NOTE — Assessment & Plan Note (Deleted)
10/04/2021: Palpable left breast mass and left breast swelling for 2 months, mammogram revealed 9 cm abnormality at 2 o'clock position plus another mass at 8:00 measuring 1.3 cm: 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 Bone scan 10/23/2021: No evidence of bony metastases.  Left mandible activity benign   Treatment plan: 1.  Neoadjuvant chemotherapy with dose dense Adriamycin and Cytoxan followed by Taxol completed 03/23/2022 2. 04/04/2022:Bilateral mastectomies Right mastectomy: Benign Left mastectomy: Negative for residual cancer (pathologic complete response) Dr. Georgette Dover removed a lot of tissue from the axilla but there were no viable lymph nodes. 3.  Adjuvant radiation 05/08/22- 06/11/22 4.  Adjuvant antiestrogen therapy with ovarian function suppression plus + AI + Verzinio (we will discuss this option versus tamoxifen) ------------------------------------------------------------------------------------------------------------------------------------------- Anti estrogen therapy counseling: OFS + AI+ vs tamoxifen with or without Verzinio. Since patient had a complete pathologic response I believe that tamoxifen alone may be adequate.   Tamoxifen toxicities:  Breast cancer surveillance: Breast exam 12/03/2022: Benign Mammogram: Not indicated because she had bilateral mastectomies.  Return to clinic in 1 year for follow-up

## 2022-12-06 ENCOUNTER — Ambulatory Visit: Payer: Medicaid Other | Attending: Physician Assistant | Admitting: Physician Assistant

## 2022-12-06 ENCOUNTER — Telehealth: Payer: Self-pay | Admitting: Hematology and Oncology

## 2022-12-06 ENCOUNTER — Ambulatory Visit: Payer: Medicaid Other | Admitting: Critical Care Medicine

## 2022-12-06 DIAGNOSIS — J4521 Mild intermittent asthma with (acute) exacerbation: Secondary | ICD-10-CM

## 2022-12-06 MED ORDER — ALBUTEROL SULFATE HFA 108 (90 BASE) MCG/ACT IN AERS: 1.0000 | INHALATION_SPRAY | Freq: Four times a day (QID) | RESPIRATORY_TRACT | 0 refills | Status: DC | PRN

## 2022-12-06 MED ORDER — ALBUTEROL SULFATE (2.5 MG/3ML) 0.083% IN NEBU: 2.5000 mg | INHALATION_SOLUTION | Freq: Four times a day (QID) | RESPIRATORY_TRACT | 1 refills | Status: DC | PRN

## 2022-12-06 NOTE — Telephone Encounter (Signed)
Rescheduled appointment per 12/20 staff message. Patient is aware.

## 2022-12-06 NOTE — Progress Notes (Signed)
Patient ID: Alicia Morton, female   DOB: 1981-10-14, 41 y.o.   MRN: [id number] Virtual Visit via Telephone Note  I connected with Alicia Morton on 02/14/30 at 11:10 AM EST by telephone and verified that I am speaking with the correct person using two identifiers.  Location: Patient: home Provider: [facility] office   I discussed the limitations, risks, security and privacy concerns of performing an evaluation and management service by telephone and the availability of in person appointments. I also discussed with the patient that there may be a patient responsible charge related to this service. The patient expressed understanding and agreed to proceed.   History of Present Illness:  patient unable to do virtual visit.  She needs inhaler and nebules RF.  She says when the season/weather changes, she has to [place] her inhalers more frequently.  No fever or cough.  Not struggling to breathe or in respiratory distress.  Does not feel sick.  She has an appt with Dr [name] next month.    Observations/Objective:  NAD.  A&Ox3   Assessment and Plan: 1. Mild intermittent asthma with acute exacerbation Consider a daily inhaler if patient continuing to use daily at f/up visit - albuterol (PROVENTIL) (2.5 MG/3ML) 0.083% nebulizer solution; Take 3 mLs (2.5 mg total) by nebulization every 6 (six) hours as needed for wheezing or shortness of breath.  Dispense: 150 mL; Refill: 1 - albuterol (VENTOLIN HFA) 108 (90 Base) MCG/ACT inhaler; Inhale 1-2 puffs into the lungs every 6 (six) hours as needed for wheezing or shortness of breath.  Dispense: 18 g; Refill: 0    Follow Up Instructions: Has appt with Dr [name] in January 2024   I discussed the assessment and treatment plan with the patient. The patient was provided an opportunity to ask questions and all were answered. The patient agreed with the plan and demonstrated an understanding of the instructions.   The patient was advised to call back or seek an  in-person evaluation if the symptoms worsen or if the condition fails to improve as anticipated.  I provided 7 minutes of non-face-to-face time during this encounter.   Alicia Caldron, PA-C

## 2022-12-24 ENCOUNTER — Ambulatory Visit: Payer: Medicaid Other | Admitting: Advanced Practice Midwife

## 2023-01-04 ENCOUNTER — Encounter: Payer: Self-pay | Admitting: Hematology and Oncology

## 2023-01-05 LAB — SIGNATERA
SIGNATERA MTM READOUT: 0 MTM/ml
SIGNATERA TEST RESULT: NEGATIVE

## 2023-01-07 ENCOUNTER — Other Ambulatory Visit: Payer: Self-pay | Admitting: *Deleted

## 2023-01-07 DIAGNOSIS — C50412 Malignant neoplasm of upper-outer quadrant of left female breast: Secondary | ICD-10-CM

## 2023-01-07 MED ORDER — FUROSEMIDE 20 MG PO TABS: 20.0000 mg | ORAL_TABLET | Freq: Every day | ORAL | 0 refills | Status: DC | PRN

## 2023-01-07 NOTE — Telephone Encounter (Signed)
Refilled per Dr. Geralyn Flash verbal order with recommendation for patient to contact and follow up with Dr. Mahalia Longest. Patient informed of all information via MyChart.

## 2023-01-08 NOTE — Assessment & Plan Note (Signed)
10/04/2021: Palpable left breast mass and left breast swelling for 2 months, mammogram revealed 9 cm abnormality at 2 o'clock position plus another mass at 8:00 measuring 1.3 cm: 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 Bone scan 10/23/2021: No evidence of bony metastases.  Left mandible activity benign   Treatment plan: 1.  Neoadjuvant chemotherapy with dose dense Adriamycin and Cytoxan followed by Taxol completed 03/23/2022 2. 04/04/2022:Bilateral mastectomies Right mastectomy: Benign Left mastectomy: Negative for residual cancer (pathologic complete response) Dr. Georgette Dover removed a lot of tissue from the axilla but there were no viable lymph nodes. 3.  Adjuvant radiation 4.  Adjuvant antiestrogen therapy with ovarian function suppression plus + AI + Verzinio (we will discuss this option versus tamoxifen) ------------------------------------------------------------------------------------------------------------------------------------------- Patient had a complete pathologic response to chemotherapy.  This indicates a very favorable prognosis.   Patient still has drains in place and is uncomfortable as a result of them.   Leg swelling: Currently on Lasix. 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

## 2023-01-09 ENCOUNTER — Inpatient Hospital Stay: Payer: Medicaid Other | Attending: Hematology and Oncology | Admitting: Hematology and Oncology

## 2023-01-09 ENCOUNTER — Other Ambulatory Visit: Payer: Self-pay

## 2023-01-09 VITALS — BP 141/69 | HR 83 | Temp 97.4°F | Resp 18 | Ht 70.0 in | Wt 236.2 lb

## 2023-01-09 DIAGNOSIS — Z923 Personal history of irradiation: Secondary | ICD-10-CM | POA: Diagnosis not present

## 2023-01-09 DIAGNOSIS — Z9013 Acquired absence of bilateral breasts and nipples: Secondary | ICD-10-CM | POA: Diagnosis not present

## 2023-01-09 DIAGNOSIS — C7951 Secondary malignant neoplasm of bone: Secondary | ICD-10-CM | POA: Diagnosis not present

## 2023-01-09 DIAGNOSIS — Z170 Estrogen receptor positive status [ER+]: Secondary | ICD-10-CM | POA: Diagnosis not present

## 2023-01-09 DIAGNOSIS — C50412 Malignant neoplasm of upper-outer quadrant of left female breast: Secondary | ICD-10-CM | POA: Diagnosis not present

## 2023-01-09 DIAGNOSIS — Z79810 Long term (current) use of selective estrogen receptor modulators (SERMs): Secondary | ICD-10-CM | POA: Insufficient documentation

## 2023-01-09 NOTE — Progress Notes (Signed)
Patient Care Team: Elsie Stain, MD as PCP - General (Pulmonary Disease) Nicholas Lose, MD as Consulting Physician (Hematology and Oncology) Donnie Mesa, MD as Consulting Physician (General Surgery) Eppie Gibson, MD as Attending Physician (Radiation Oncology)  DIAGNOSIS:  Encounter Diagnosis  Name Primary?   Malignant neoplasm of upper-outer quadrant of left breast in female, estrogen receptor positive ([place]) Yes    SUMMARY OF ONCOLOGIC HISTORY: Oncology History  Malignant neoplasm of upper-outer quadrant of left breast in female, estrogen receptor positive ([place])  10/04/2021 Initial Diagnosis   Palpable left breast mass and left breast swelling for 2 months, mammogram revealed 9 cm abnormality at 2 o'clock position plus another mass at 8:00 measuring 1.3 cm: Biopsy of both revealed grade 3 IDC ER 95%, PR 0%, HER2 negative, Ki-67 40%, 5 abnormal lymph nodes: Biopsy of 1 was positive (rib metastases seen on mammogram)   10/11/2021 Cancer Staging   Staging form: Breast, AJCC 8th Edition - Clinical stage from 10/11/2021: Stage IV (cT3, cN1, cM1, G3, ER+, PR-, HER2-) - Signed by Nicholas Lose, MD on 10/11/2021 Stage prefix: Initial diagnosis Histologic grading system: 3 grade system   10/20/2021 Genetic Testing   Negative hereditary cancer genetic testing: no pathogenic variants detected in [facility] Breast Cancer STAT Panel or Multi-Cancer +RNA Panel.  Variants of uncertain significance detected in POT1 at c.476T>C (p.Met159Thr) and PRKAR1A at c.27T>G (p.Ser9Arg).  The report dates are October 19, 2021 and October 29, 2021, respectively.   The Multi-Cancer + RNA Panel offered by [facility] includes sequencing and/or deletion/duplication analysis of the following 84 genes:  AIP*, ALK, APC*, ATM*, AXIN2*, BAP1*, BARD1*, BLM*, BMPR1A*, BRCA1*, BRCA2*, BRIP1*, CASR, CDC73*, CDH1*, CDK4, CDKN1B*, CDKN1C*, CDKN2A, CEBPA, CHEK2*, CTNNA1*, DICER1*, DIS3L2*, EGFR, EPCAM, FH*, FLCN*, GATA2*,  GPC3, GREM1, HOXB13, HRAS, KIT, MAX*, MEN1*, MET, MITF, MLH1*, MSH2*, MSH3*, MSH6*, MUTYH*, NBN*, NF1*, NF2*, NTHL1*, PALB2*, PDGFRA, PHOX2B, PMS2*, POLD1*, POLE*, POT1*, PRKAR1A*, PTCH1*, PTEN*, RAD50*, RAD51C*, RAD51D*, RB1*, RECQL4, RET, RUNX1*, SDHA*, SDHAF2*, SDHB*, SDHC*, SDHD*, SMAD4*, SMARCA4*, SMARCB1*, SMARCE1*, STK11*, SUFU*, TERC, TERT, TMEM127*, Tp53*, TSC1*, TSC2*, VHL*, WRN*, and WT1.  RNA analysis is performed for * genes.   10/21/2021 Imaging   CT chest/abdomen/pelvis  IMPRESSION: 1. Skin thickening left breast with asymmetric soft tissue in the lateral aspect of the left breast and left axillary lymphadenopathy. Imaging features compatible with known left breast cancer. 2. No evidence for distant metastatic disease in the chest, abdomen, or pelvis. 3. 2 mm right middle lobe pulmonary nodule, stable since prior study, likely benign. Attention on follow-up recommended.     Electronically Signed   By: Misty Stanley M.D.   On: 10/21/2021 08:06  EXAM: NUCLEAR MEDICINE WHOLE BODY BONE SCAN   TECHNIQUE: Whole body anterior and posterior images were obtained approximately 3 hours after intravenous injection of radiopharmaceutical.   RADIOPHARMACEUTICALS:  18.5 mCi Technetium-46mMDP IV   COMPARISON:  CT chest, abdomen and pelvis from October 20, 2021.   FINDINGS: Asymmetric uptake in the LEFT greater than RIGHT mandible is focal, bilateral maxillary uptake is noted.   Soft tissue activity greatest associated with LEFT as compared to RIGHT breast.   Degenerative changes about the knees with associated radiotracer accumulation.   No focal area to suggest bony metastatic disease.   Mild increased activity over the lumbar spine the mid and lower lumbar spine corresponding to facet arthropathy in these location seen on posterior projection.   Symmetric renal activity and excretion into the urinary bladder.   IMPRESSION: Activity about LEFT  mandible likely related  to odontogenic process, similar findings in the maxilla bilaterally.   Degenerative changes in the knees and lumbar spine.     Electronically Signed   By: Zetta Bills M.D.   On: 10/23/2021 09:54     11/01/2021 - 03/12/2022 Chemotherapy   Patient is on Treatment Plan : BREAST ADJUVANT DOSE DENSE AC q14d / PACLitaxel q7d     04/04/2022 Surgery   Bilateral mastectomies Right mastectomy: Benign Left mastectomy: Negative for residual cancer (pathologic complete response)   05/07/2022 - 06/11/2022 Radiation Therapy   Site Technique Total Dose (Gy) Dose per Fx (Gy) Completed Fx Beam Energies  Chest Wall, Left: CW_L 3D 50/50 2 25/25 10X  Chest Wall, Left: CW_L_SCV_PAB 3D 50/50 2 25/25 10X     06/2022 -  Anti-estrogen oral therapy   Tamoxifen     CHIEF COMPLIANT:  Follow-up Tamoxifen  INTERVAL HISTORY: Alicia Morton is a 42 y.o. with left breast cancer, currently on tamoxifen and surveillance. She presents to the clinic today for a follow-up. She reports that she has severe hot flashes. She does have muscle cramps in right leg. She has it mostly in her calve. She denies any swelling. She says she still have neuropathy in her feet, denies any in her hands.   ALLERGIES:  has No Known Allergies.  MEDICATIONS:  Current Outpatient Medications  Medication Sig Dispense Refill   albuterol (PROVENTIL) (2.5 MG/3ML) 0.083% nebulizer solution Take 3 mLs (2.5 mg total) by nebulization every 6 (six) hours as needed for wheezing or shortness of breath. 150 mL 1   albuterol (VENTOLIN HFA) 108 (90 Base) MCG/ACT inhaler Inhale 1-2 puffs into the lungs every 6 (six) hours as needed for wheezing or shortness of breath. 18 g 0   cyanocobalamin (VITAMIN B12) 500 MCG tablet Take 1,000 mcg by mouth daily. 5,ooo     Fish Oil-Cholecalciferol (FISH OIL + D3) 1000-1000 MG-UNIT CAPS Take by mouth.     furosemide (LASIX) 20 MG tablet Take 1 tablet (20 mg total) by mouth daily as needed. 15 tablet 0   Multiple  Vitamins-Minerals (MULTIVITAMIN WITH MINERALS) tablet Take 1 tablet by mouth daily.     tamoxifen (NOLVADEX) 20 MG tablet Take 1 tablet (20 mg total) by mouth daily. 90 tablet 3   vitamin E 1000 UNIT capsule Take 1,000 Units by mouth daily.     No current facility-administered medications for this visit.    PHYSICAL EXAMINATION: ECOG PERFORMANCE STATUS: 1 - Symptomatic but completely ambulatory  Vitals:   01/09/23 1021  BP: (!) 141/69  Pulse: 83  Resp: 18  Temp: (!) 97.4 F (36.3 C)  SpO2: 95%   Filed Weights   01/09/23 1021  Weight: 236 lb 3.2 oz (107.1 kg)    BREAST: Bil mastectomy scars. (exam performed in the presence of a chaperone)  LABORATORY DATA:  I have reviewed the data as listed    Latest Ref Rng & Units 04/05/2022    2:17 AM 04/02/2022    9:48 AM 03/23/2022    2:11 PM  CMP  Glucose 70 - 99 mg/dL 110  90  82   BUN 6 - 20 mg/dL '10  11  9   '$ Creatinine 0.44 - 1.00 mg/dL 0.60  0.53  0.55   Sodium 135 - 145 mmol/L 139  138  139   Potassium 3.5 - 5.1 mmol/L 3.9  3.8  3.5   Chloride 98 - 111 mmol/L 107  105  106  CO2 22 - 32 mmol/L '25  28  27   '$ Calcium 8.9 - 10.3 mg/dL 8.8  9.0  9.0   Total Protein 6.5 - 8.1 g/dL  7.1  7.0   Total Bilirubin 0.3 - 1.2 mg/dL  0.4  0.6   Alkaline Phos 38 - 126 U/L  67  71   AST 15 - 41 U/L  14  15   ALT 0 - 44 U/L  10  11     Lab Results  Component Value Date   WBC 11.1 (H) 04/05/2022   HGB 9.2 (L) 04/05/2022   HCT 29.6 (L) 04/05/2022   MCV 85.8 04/05/2022   PLT 372 04/05/2022   NEUTROABS 4.8 04/02/2022    ASSESSMENT & PLAN:  Malignant neoplasm of upper-outer quadrant of left breast in female, estrogen receptor positive ([place]) 10/04/2021: Palpable left breast mass and left breast swelling for 2 months, mammogram revealed 9 cm abnormality at 2 o'clock position plus another mass at 8:00 measuring 1.3 cm: Biopsy of both revealed grade 3 IDC ER 95%, PR 0%, HER2 negative, Ki-67 40%, 5 abnormal lymph nodes: Biopsy of 1 was  positive (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 Bone scan 10/23/2021: No evidence of bony metastases.  Left mandible activity benign   Treatment plan: 1.  Neoadjuvant chemotherapy with dose dense Adriamycin and Cytoxan followed by Taxol completed 03/23/2022 2. 04/04/2022:Bilateral mastectomies Right mastectomy: Benign Left mastectomy: Negative for residual cancer (pathologic complete response) Dr. Georgette Dover removed a lot of tissue from the axilla but there were no viable lymph nodes. 3.  Adjuvant radiation completed 06/11/2022 4.  Adjuvant antiestrogen therapy with ovarian function suppression plus + AI + Verzinio (we will discuss this option versus tamoxifen) -------------------------------------------------------------------------------------------------------------------------------------------   Leg swelling: Currently on Lasix. Chemo-induced peripheral neuropathy: Monitoring   Current treatment: Tamoxifen 20 mg daily started 06/14/2022  Tamoxifen toxicities: Hot flashes Muscle cramps  Breast cancer surveillance: Breast exam 01/09/2023: Benign, Bil mastectomy scars No role of imaging study since she had bilateral mastectomies  Return to clinic in 1 year for follow-up    No orders of the defined types were placed in this encounter.  The patient has a good understanding of the overall plan. she agrees with it. she will call with any problems that may develop before the next visit here. Total time spent: 30 mins including face to face time and time spent for planning, charting and co-ordination of care   Harriette Ohara, MD 01/09/23    I Gardiner Coins am acting as a Education administrator for [employer]  I have reviewed the above documentation for accuracy and completeness, and I agree with the above.

## 2023-01-13 NOTE — Progress Notes (Unsigned)
   Established Patient Office Visit  Subjective   Patient ID: Alicia Morton, female    DOB: 10/18/1981  Age: 42 y.o. MRN: [id number]  No chief complaint on file.   Not seen since 2022  Follows with Onc for breast cancer RX:  Malignant neoplasm of upper-outer quadrant of left breast in female, estrogen receptor positive ([place]) 10/04/2021: Palpable left breast mass and left breast swelling for 2 months, mammogram revealed 9 cm abnormality at 2 o'clock position plus another mass at 8:00 measuring 1.3 cm: 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 Bone scan 10/23/2021: No evidence of bony metastases.  Left mandible activity benign   Treatment plan: 1.  Neoadjuvant chemotherapy with dose dense Adriamycin and Cytoxan followed by Taxol completed 03/23/2022 2. 04/04/2022:Bilateral mastectomies Right mastectomy: Benign Left mastectomy: Negative for residual cancer (pathologic complete response) Dr. Georgette Dover removed a lot of tissue from the axilla but there were no viable lymph nodes. 3.  Adjuvant radiation completed 06/11/2022 4.  Adjuvant antiestrogen therapy with ovarian function suppression plus + AI + Verzinio (we will discuss this option versus tamoxifen) -------------------------------------------------------------------------------------------------------------------------------------------   Leg swelling: Currently on Lasix. Chemo-induced peripheral neuropathy: Monitoring   Current treatment: Tamoxifen 20 mg daily started 06/14/2022   Tamoxifen toxicities: 1. Hot flashes 2. Muscle cramps   Breast cancer surveillance: 1. Breast exam 01/09/2023: Benign, Bil mastectomy scars 2. No role of imaging study since she had bilateral mastectomies   Return to clinic in 1  year for follow-up       No orders of the defined types were placed in this encounter.   The patient has a good understanding of the overall plan. she agrees with it. she will call with any problems that may develop before the next visit here. Total time spent: 30 mins including face to face time and time spent for planning, charting and co-ordination of care    Harriette Ohara, MD     {History (Optional):23778}  ROS    Objective:     There were no vitals taken for this visit. {Vitals History (Optional):23777}  Physical Exam   No results found for any visits on 01/15/23.  {Labs (Optional):23779}  The ASCVD Risk score (Arnett DK, et al., 2019) failed to calculate for the following reasons:   Cannot find a previous HDL lab   Cannot find a previous total cholesterol lab    Assessment & Plan:   Problem List Items Addressed This Visit   None   No follow-ups on file.    Asencion Noble, MD

## 2023-01-14 ENCOUNTER — Other Ambulatory Visit: Payer: Self-pay | Admitting: Hematology and Oncology

## 2023-01-14 ENCOUNTER — Telehealth: Payer: Self-pay

## 2023-01-14 DIAGNOSIS — C50412 Malignant neoplasm of upper-outer quadrant of left female breast: Secondary | ICD-10-CM

## 2023-01-14 NOTE — Telephone Encounter (Signed)
Per MD note, Pt to follow up with cardiology regarding lasix refills

## 2023-01-14 NOTE — Telephone Encounter (Signed)
Called Pt regarding lasix rx refill. Per note, Dr. Lindi Adie requesting Pt follow up with cardiologist Dr. Haroldine Laws for lasix rx management. Pt verbalized understanding and stated she would follow up with cardiologist.

## 2023-01-15 ENCOUNTER — Ambulatory Visit: Payer: Medicaid Other | Attending: Critical Care Medicine | Admitting: Critical Care Medicine

## 2023-01-15 ENCOUNTER — Encounter: Payer: Self-pay | Admitting: Critical Care Medicine

## 2023-01-15 VITALS — BP 112/74 | HR 62 | Temp 98.4°F

## 2023-01-15 DIAGNOSIS — E66811 Obesity, class 1: Secondary | ICD-10-CM

## 2023-01-15 DIAGNOSIS — Z124 Encounter for screening for malignant neoplasm of cervix: Secondary | ICD-10-CM

## 2023-01-15 DIAGNOSIS — Z139 Encounter for screening, unspecified: Secondary | ICD-10-CM | POA: Diagnosis not present

## 2023-01-15 DIAGNOSIS — C50412 Malignant neoplasm of upper-outer quadrant of left female breast: Secondary | ICD-10-CM

## 2023-01-15 DIAGNOSIS — C50912 Malignant neoplasm of unspecified site of left female breast: Secondary | ICD-10-CM

## 2023-01-15 DIAGNOSIS — Z170 Estrogen receptor positive status [ER+]: Secondary | ICD-10-CM

## 2023-01-15 DIAGNOSIS — E669 Obesity, unspecified: Secondary | ICD-10-CM

## 2023-01-15 DIAGNOSIS — J4520 Mild intermittent asthma, uncomplicated: Secondary | ICD-10-CM

## 2023-01-15 NOTE — Assessment & Plan Note (Signed)
As per oncology

## 2023-01-15 NOTE — Assessment & Plan Note (Signed)
Would benefit from a lifestyle management approach  The following Lifestyle Medicine recommendations according to [facility]) were discussed and offered to patient who agrees to start the journey:  A. Whole Foods, Plant-based plate comprising of fruits and vegetables, plant-based proteins, whole-grain carbohydrates was discussed in detail with the patient.   A list for source of those nutrients were also provided to the patient.  Patient will use only water or unsweetened tea for hydration. B.  The need to stay away from risky substances including alcohol, smoking; obtaining 7 to 9 hours of restorative sleep, at least 150 minutes of moderate intensity exercise weekly, the importance of healthy social connections,  and stress reduction techniques were discussed. C.  A full color page of  Calorie density of various food groups per pound showing examples of each food groups was provided to the patient.

## 2023-01-15 NOTE — Patient Instructions (Addendum)
Routine health screening labs obtained at this visit  You have multiple refills on your inhalers and nebulizer medicines  Keep your follow-ups with oncology  Focus on the lifestyle medicine handout for weight loss and reducing your cancer recurrence risk with a plant-based diet and walking for 30 minutes 4-5 times a week  Return to Dr. [name] 1 year         Advice for Weight Management   -For most of [place] the best way to lose weight is by diet management. Generally speaking, diet management means consuming less calories intentionally which over time brings about progressive weight loss.  This can be achieved more effectively by avoiding ultra processed carbohydrates, processed meats, unhealthy fats.    It is critically important to know your numbers: how much calorie you are consuming and how much calorie you need. More importantly, our carbohydrates sources should be unprocessed naturally occurring  complex starch food items.  It is always important to balance nutrition also by  appropriate intake of proteins (mainly plant-based), healthy fats/oils, plenty of fruits and vegetables.    -The American College of Lifestyle Medicine (ACL M) recommends nutrition derived mostly from Whole Food, Plant Predominant Sources example an apple instead of applesauce or apple pie. Eat Plenty of vegetables, Mushrooms, fruits, Legumes, Whole Grains, Nuts, seeds in lieu of processed meats, processed snacks/pastries red meat, poultry, eggs.  Use only water or unsweetened tea for hydration.  The College also recommends the need to stay away from risky substances including alcohol, smoking; obtaining 7-9 hours of restorative sleep, at least 150 minutes of moderate intensity exercise weekly, importance of healthy social connections, and being mindful of stress and seek help when it is overwhelming.     -Sticking to a routine mealtime to eat 3 meals a day and avoiding unnecessary snacks is shown to have a big role in weight  control. Under normal circumstances, the only time we burn stored energy is when we are hungry, so allow  some hunger to take place- hunger means no food between appropriate meal times, only water.  It is not advisable to starve.    -It is better to avoid simple carbohydrates including: Cakes, Sweet Desserts, Ice Cream, Soda (diet and regular), Sweet Tea, Candies, Chips, Cookies, Store Bought Juices, Alcohol in Excess of  1-2 drinks a day, Lemonade,  Artificial Sweeteners, Doughnuts, Coffee Creamers, "Sugar-free" Products, etc, etc.  This is not a complete list...Marland Kitchen.    -Consulting with certified diabetes educators is proven to provide you with the most accurate and current information on diet.  Also, you may be  interested in discussing diet options/exchanges , we can schedule a visit with Jearld Fenton, RDN, CDE for individualized nutrition education.   -Exercise: If you are able: 30 -60 minutes a day ,4 days a week, or 150 minutes of moderate intensity exercise weekly.    The longer the better if tolerated.  Combine stretch, strength, and aerobic activities.  If you were told in the past that you have high risk for cardiovascular diseases, or if you are currently symptomatic, you may seek evaluation by your heart doctor prior to initiating moderate to intense exercise programs.                                    Additional Care Considerations for Diabetes/Prediabetes     -Diabetes  is a chronic disease.  The most important care consideration  is regular follow-up with your diabetes care provider with the goal being avoiding or delaying its complications and to take advantage of advances in medications and technology.  If appropriate actions are taken early enough, type 2 diabetes can even be reversed.  Seek information from the right source.   - Whole Food, Plant Predominant Nutrition is highly recommended: Eat Plenty of vegetables, Mushrooms, fruits, Legumes, Whole Grains, Nuts, seeds in lieu of  processed meats, processed snacks/pastries red meat, poultry, eggs as recommended by [employer] of  Lifestyle Medicine (ACLM).   -Type 2 diabetes is known to coexist with other important comorbidities such as high blood pressure and high cholesterol.  It is critical to control not only the diabetes but also the high blood pressure and high cholesterol to minimize and delay the risk of complications including coronary artery disease, stroke, amputations, blindness, etc.  The good news is that this diet recommendation for type 2 diabetes is also very helpful for managing high cholesterol and high blood blood pressure.   - Studies showed that people with diabetes will benefit from a class of medications known as ACE inhibitors and statins.  Unless there are specific reasons not to be on these medications, the standard of care is to consider getting one from these groups of medications at an optimal doses.  These medications are generally considered safe and proven to help protect the heart and the kidneys.     - People with diabetes are encouraged to initiate and maintain regular follow-up with eye doctors, foot doctors, dentists , and if necessary heart and kidney doctors.      - It is highly recommended that people with diabetes quit smoking or stay away from smoking, and get yearly  flu vaccine and pneumonia vaccine at least every 5 years.  See above for additional recommendations on exercise, sleep, stress management , and healthy social connections.

## 2023-01-15 NOTE — Assessment & Plan Note (Signed)
Has upcoming appointment for Pap smear

## 2023-01-15 NOTE — Assessment & Plan Note (Signed)
Invasive ductal carcinoma left breast status post chemotherapy radiation mastectomies now disease-free under cancer surveillance and to continue tamoxifen

## 2023-01-15 NOTE — Assessment & Plan Note (Signed)
Stable mild intermittent asthma continue with albuterol as needed

## 2023-01-16 LAB — CBC WITH DIFFERENTIAL/PLATELET
Basophils Absolute: 0.1 10*3/uL (ref 0.0–0.2)
Basos: 1 %
EOS (ABSOLUTE): 0.3 10*3/uL (ref 0.0–0.4)
Eos: 5 %
Hematocrit: 37.4 % (ref 34.0–46.6)
Hemoglobin: 11.6 g/dL (ref 11.1–15.9)
Immature Grans (Abs): 0 10*3/uL (ref 0.0–0.1)
Immature Granulocytes: 0 %
Lymphocytes Absolute: 1.6 10*3/uL (ref 0.7–3.1)
Lymphs: 24 %
MCH: 26.7 pg (ref 26.6–33.0)
MCHC: 31 g/dL — ABNORMAL LOW (ref 31.5–35.7)
MCV: 86 fL (ref 79–97)
Monocytes Absolute: 0.5 10*3/uL (ref 0.1–0.9)
Monocytes: 8 %
Neutrophils Absolute: 4.1 10*3/uL (ref 1.4–7.0)
Neutrophils: 62 %
Platelets: 316 10*3/uL (ref 150–450)
RBC: 4.35 x10E6/uL (ref 3.77–5.28)
RDW: 14.4 % (ref 11.7–15.4)
WBC: 6.6 10*3/uL (ref 3.4–10.8)

## 2023-01-16 LAB — COMPREHENSIVE METABOLIC PANEL
ALT: 11 IU/L (ref 0–32)
AST: 20 IU/L (ref 0–40)
Albumin/Globulin Ratio: 1.2 (ref 1.2–2.2)
Albumin: 4.2 g/dL (ref 3.9–4.9)
Alkaline Phosphatase: 71 IU/L (ref 44–121)
BUN/Creatinine Ratio: 16 (ref 9–23)
BUN: 10 mg/dL (ref 6–24)
Bilirubin Total: 0.3 mg/dL (ref 0.0–1.2)
CO2: 23 mmol/L (ref 20–29)
Calcium: 9.3 mg/dL (ref 8.7–10.2)
Chloride: 102 mmol/L (ref 96–106)
Creatinine, Ser: 0.63 mg/dL (ref 0.57–1.00)
Globulin, Total: 3.5 g/dL (ref 1.5–4.5)
Glucose: 73 mg/dL (ref 70–99)
Potassium: 4 mmol/L (ref 3.5–5.2)
Sodium: 140 mmol/L (ref 134–144)
Total Protein: 7.7 g/dL (ref 6.0–8.5)
eGFR: 113 mL/min/{1.73_m2} (ref >59)

## 2023-01-16 LAB — LIPID PANEL
Chol/HDL Ratio: 2.6 ratio (ref 0.0–4.4)
Cholesterol, Total: 183 mg/dL (ref 100–199)
HDL: 70 mg/dL (ref >39)
LDL Chol Calc (NIH): 103 mg/dL — ABNORMAL HIGH (ref 0–99)
Triglycerides: 54 mg/dL (ref 0–149)
VLDL Cholesterol Cal: 10 mg/dL (ref 5–40)

## 2023-01-16 NOTE — Progress Notes (Signed)
Let pt know all labs normal except cholesterol is slightly high

## 2023-01-28 ENCOUNTER — Ambulatory Visit: Payer: Medicaid Other
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
## 2023-01-29 ENCOUNTER — Ambulatory Visit: Payer: Medicaid Other | Attending: Hematology and Oncology

## 2023-01-29 DIAGNOSIS — Z483 Aftercare following surgery for neoplasm: Secondary | ICD-10-CM | POA: Insufficient documentation

## 2023-01-29 DIAGNOSIS — Z170 Estrogen receptor positive status [ER+]: Secondary | ICD-10-CM | POA: Insufficient documentation

## 2023-01-29 DIAGNOSIS — C50812 Malignant neoplasm of overlapping sites of left female breast: Secondary | ICD-10-CM | POA: Insufficient documentation

## 2023-02-11 ENCOUNTER — Ambulatory Visit: Payer: Medicaid Other | Admitting: Obstetrics and Gynecology

## 2023-02-18 ENCOUNTER — Other Ambulatory Visit: Payer: Self-pay | Admitting: Pharmacist

## 2023-02-18 ENCOUNTER — Telehealth: Payer: Self-pay

## 2023-02-18 MED ORDER — PREDNISONE 10 MG PO TABS: 40.0000 mg | ORAL_TABLET | Freq: Every day | ORAL | 0 refills | Status: DC

## 2023-02-18 NOTE — Telephone Encounter (Signed)
Patient came in office stating that she need prednisone due to bronchitis flare up, Patient doesn't want to go and wait at urgent care

## 2023-02-18 NOTE — Telephone Encounter (Signed)
Called patient and left vm  

## 2023-02-18 NOTE — Telephone Encounter (Signed)
I will fill once i can get my laptop working. Currently traveling  [facility] know if can help.  I would rx prednisone 10 mg take 4 daily x 5day disp 20

## 2023-03-21 ENCOUNTER — Ambulatory Visit: Payer: Medicaid Other | Admitting: Critical Care Medicine

## 2023-03-21 NOTE — Progress Notes (Deleted)
Established Patient Office Visit  Subjective   Patient ID: Alicia Morton, female    DOB: 1981/05/15  Age: 42 y.o. MRN: [id number]  No chief complaint on file.    This is a pleasant 42 year old female I have not seen since 2022   The patient developed breast cancer in the fall 2022 and was very busy with seeing oncology and general surgery.  Below is documentations from the most recent oncology visit. Follows with Onc for breast cancer RX:  Malignant neoplasm of upper-outer quadrant of left breast in female, estrogen receptor positive ([place]) 10/04/2021: Palpable left breast mass and left breast swelling for 2 months, mammogram revealed 9 cm abnormality at 2 o'clock position plus another mass at 8:00 measuring 1.3 cm: 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 Bone scan 10/23/2021: No evidence of bony metastases.  Left mandible activity benign   Treatment plan: 1.  Neoadjuvant chemotherapy with dose dense Adriamycin and Cytoxan followed by Taxol completed 03/23/2022 2. 04/04/2022:Bilateral mastectomies Right mastectomy: Benign Left mastectomy: Negative for residual cancer (pathologic complete response) Dr. Georgette Dover removed a lot of tissue from the axilla but there were no viable lymph nodes. 3.  Adjuvant radiation completed 06/11/2022 4.  Adjuvant antiestrogen therapy with ovarian function suppression plus + AI + Verzinio (we will discuss this option versus tamoxifen) -------------------------------------------------------------------------------------------------------------------------------------------   Leg swelling: Currently on Lasix. Chemo-induced peripheral neuropathy: Monitoring   Current treatment: Tamoxifen 20 mg daily started 06/14/2022   Tamoxifen  toxicities: 1. Hot flashes 2. Muscle cramps   Breast cancer surveillance: 1. Breast exam 01/09/2023: Benign, Bil mastectomy scars 2. No role of imaging study since she had bilateral mastectomies   Return to clinic in 1 year for follow-up       No orders of the defined types were placed in this encounter.   The patient has a good understanding of the overall plan. she agrees with it. she will call with any problems that may develop before the next visit here. Total time spent: 30 mins including face to face time and time spent for planning, charting and co-ordination of care    Harriette Ohara, MD  Patient now is cancer free.  Recent genetic testing had improved.  Patient has gained some weight and is not following a healthy diet.  Blood pressure on arrival is good 112/74.  Patient does needs some health screenings.  She is now on tamoxifen.  03/21/23       Review of Systems  Constitutional:  Negative for chills, diaphoresis, fever, malaise/fatigue and weight loss.  HENT:  Negative for congestion, hearing loss, nosebleeds, sore throat and tinnitus.   Eyes:  Negative for blurred vision, photophobia and redness.  Respiratory:  Negative for cough, hemoptysis, sputum production, shortness of breath, wheezing and stridor.   Cardiovascular:  Negative for chest pain, palpitations, orthopnea, claudication, leg swelling and PND.  Gastrointestinal:  Negative for abdominal pain, blood in stool, constipation, diarrhea, heartburn, nausea and vomiting.  Genitourinary:  Negative for dysuria, flank pain, frequency, hematuria and urgency.  Musculoskeletal:  Negative for back pain, falls, joint pain, myalgias and neck pain.  Skin:  Negative for itching and rash.  Neurological:  Negative for dizziness, tingling, tremors, sensory change, speech change, focal weakness, seizures, loss of consciousness,  weakness and headaches.  Endo/Heme/Allergies:  Negative for environmental allergies and polydipsia.  Does not bruise/bleed easily.  Psychiatric/Behavioral:  Negative for depression, memory loss, substance abuse and suicidal ideas. The patient is not nervous/anxious and does not have insomnia.       Objective:     There were no vitals taken for this visit.   Physical Exam Vitals reviewed.  Constitutional:      Appearance: Normal appearance. She is well-developed. She is obese. She is not diaphoretic.  HENT:     Head: Normocephalic and atraumatic.     Nose: No nasal deformity, septal deviation, mucosal edema or rhinorrhea.     Right Sinus: No maxillary sinus tenderness or frontal sinus tenderness.     Left Sinus: No maxillary sinus tenderness or frontal sinus tenderness.     Mouth/Throat:     Pharynx: No oropharyngeal exudate.  Eyes:     General: No scleral icterus.    Conjunctiva/sclera: Conjunctivae normal.     Pupils: Pupils are equal, round, and reactive to light.  Neck:     Thyroid: No thyromegaly.     Vascular: No carotid bruit or JVD.     Trachea: Trachea normal. No tracheal tenderness or tracheal deviation.  Cardiovascular:     Rate and Rhythm: Normal rate and regular rhythm.     Chest Wall: PMI is not displaced.     Pulses: Normal pulses. No decreased pulses.     Heart sounds: Normal heart sounds, S1 normal and S2 normal. Heart sounds not distant. No murmur heard.    No systolic murmur is present.     No diastolic murmur is present.     No friction rub. No gallop. No S3 or S4 sounds.  Pulmonary:     Effort: No tachypnea, accessory muscle usage or respiratory distress.     Breath sounds: No stridor. No decreased breath sounds, wheezing, rhonchi or rales.  Chest:     Chest wall: No tenderness.  Breasts:    Right: Absent.     Left: Absent.     Comments: Status post bilateral mastectomies Abdominal:     General: Bowel sounds are normal. There is no distension.     Palpations: Abdomen is soft. Abdomen is not rigid.     Tenderness: There is no abdominal tenderness.  There is no guarding or rebound.  Musculoskeletal:        General: Normal range of motion.     Cervical back: Normal range of motion and neck supple. No edema, erythema or rigidity. No muscular tenderness. Normal range of motion.  Lymphadenopathy:     Head:     Right side of head: No submental or submandibular adenopathy.     Left side of head: No submental or submandibular adenopathy.     Cervical: No cervical adenopathy.  Skin:    General: Skin is warm and dry.     Coloration: Skin is not pale.     Findings: No rash.     Nails: There is no clubbing.  Neurological:     Mental Status: She is alert and oriented to person, place, and time.     Sensory: No sensory deficit.  Psychiatric:        Speech: Speech normal.        Behavior: Behavior normal.      No results found for any visits on 03/21/23.    The 10-year ASCVD risk score (Arnett DK, et al., 2019) is: 0.2%    Assessment &  Plan:   Problem List Items Addressed This Visit   None 30 minutes spent for chart review catching up with patient's multisystem issues patient education  No follow-ups on file.    Asencion Noble, MD

## 2023-04-02 ENCOUNTER — Ambulatory Visit (INDEPENDENT_AMBULATORY_CARE_PROVIDER_SITE_OTHER): Payer: Medicaid Other

## 2023-04-02 ENCOUNTER — Other Ambulatory Visit: Payer: Self-pay

## 2023-04-02 ENCOUNTER — Ambulatory Visit
Admission: RE | Admit: 2023-04-02 | Discharge: 2023-04-02 | Disposition: A | Discharge status: Home / Self Care | Payer: Medicaid Other | Source: Ambulatory Visit | Attending: Emergency Medicine | Admitting: Emergency Medicine

## 2023-04-02 ENCOUNTER — Encounter: Payer: Medicaid Other | Admitting: Certified Nurse Midwife

## 2023-04-02 VITALS — BP 98/66 | HR 77 | Temp 98.2°F | Resp 18

## 2023-04-02 DIAGNOSIS — Q682 Congenital deformity of knee: Secondary | ICD-10-CM

## 2023-04-02 DIAGNOSIS — Z01419 Encounter for gynecological examination (general) (routine) without abnormal findings: Secondary | ICD-10-CM

## 2023-04-02 MED ORDER — IBUPROFEN 800 MG PO TABS
800.0000 mg | ORAL_TABLET | Freq: Once | ORAL | Status: AC
  Administered 2023-04-02: 800 mg via ORAL

## 2023-04-02 NOTE — Discharge Instructions (Addendum)
Your right knee x-ray reveals dislocation of your patella, also known as your kneecap.  This occurred as a result of your injury earlier today.  Your kneecap needs to be put back in place.  We do not perform that kind of procedure here at urgent care.  You are welcome to go to [facility] where they have an urgent care clinic.  They do not make appointments, they see patients on a first come first serve basis.  They are open until 8 PM tonight.  You are also welcome to go to the emergency room for the same reason.  Thank you for visiting urgent care today.
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
## 2023-04-02 NOTE — ED Triage Notes (Signed)
Pt here for right knee pain after twisting when slipping off curb yesterday

## 2023-04-03 LAB — SIGNATERA
SIGNATERA MTM READOUT: 0 MTM/ml
SIGNATERA TEST RESULT: NEGATIVE

## 2023-04-19 ENCOUNTER — Telehealth: Payer: Self-pay

## 2023-04-19 NOTE — Telephone Encounter (Signed)
Called pt per MD to advise [facility] testing was negative/not detected. Pt verbalized understanding of results and knows [facility] will be in touch to schedule 3 mo repeat lab.   

## 2023-05-06 ENCOUNTER — Ambulatory Visit: Payer: Medicaid Other | Attending: Hematology and Oncology

## 2023-05-06 VITALS — Wt 232.4 lb

## 2023-05-06 DIAGNOSIS — Z483 Aftercare following surgery for neoplasm: Secondary | ICD-10-CM | POA: Insufficient documentation
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
## 2023-05-28 ENCOUNTER — Other Ambulatory Visit: Payer: Self-pay | Admitting: Physician Assistant

## 2023-05-28 ENCOUNTER — Other Ambulatory Visit: Payer: Self-pay | Admitting: Hematology and Oncology

## 2023-05-28 DIAGNOSIS — J4521 Mild intermittent asthma with (acute) exacerbation: Secondary | ICD-10-CM

## 2023-06-02 IMAGING — MG DIGITAL DIAGNOSTIC BILAT W/ TOMO W/ CAD
7 of 13 series · 7 of 37 positions shown · non-contrast
Comparison: Previous exam(s).
COMPARISON: None

*** End of Addendum ***
COMPARISON: Previous exam(s).

Addendum:
CLINICAL DATA: 40-year-old female presenting with swelling and mass
in the left breast for approximately 2 months.

EXAM:
DIGITAL DIAGNOSTIC BILATERAL MAMMOGRAM WITH TOMOSYNTHESIS AND CAD;
ULTRASOUND LEFT BREAST LIMITED
TECHNIQUE: Bilateral digital diagnostic mammography and breast tomosynthesis
was performed. The images were evaluated with computer-aided
detection.; Targeted ultrasound examination of the left breast was
performed.

[L CC]
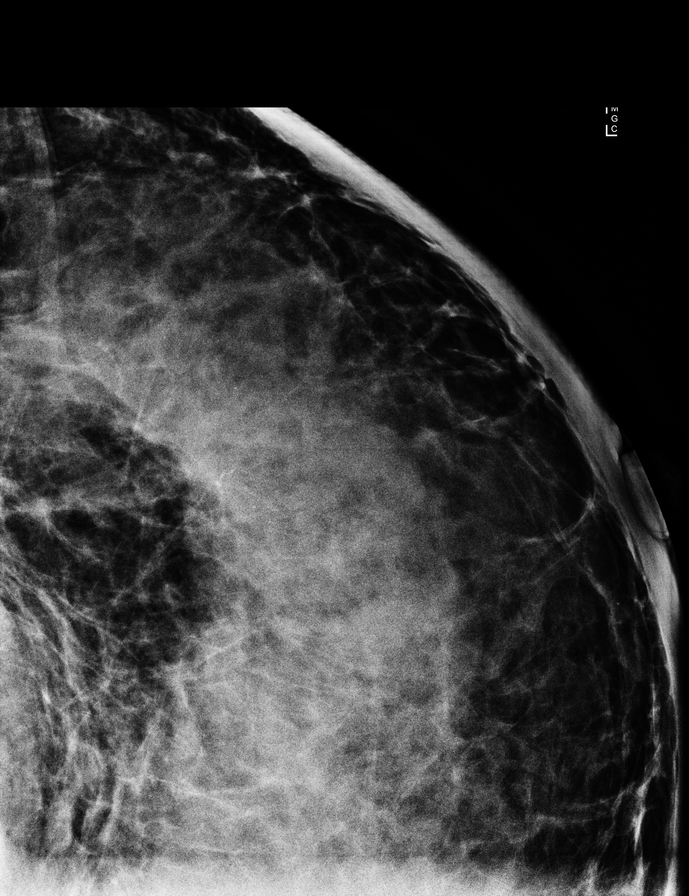

[R MLO synth-2D]
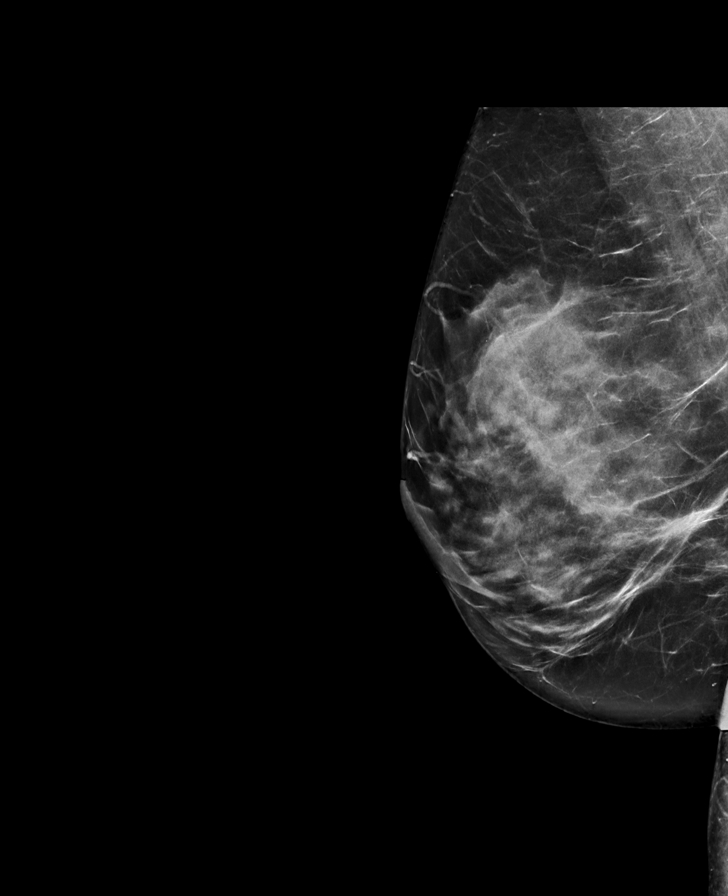

[L ML synth-2D]
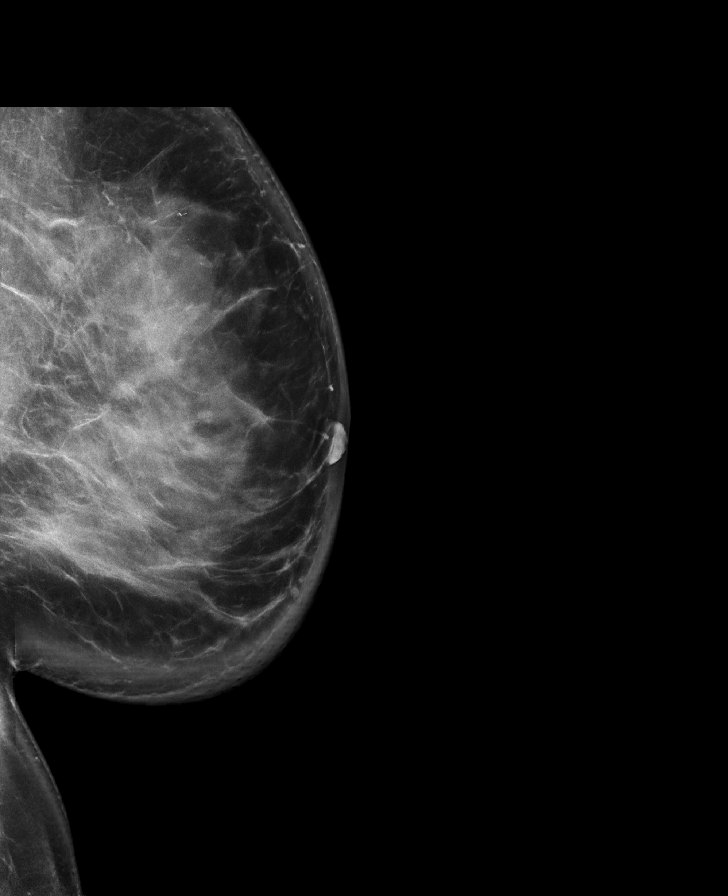

[R CC synth-2D]
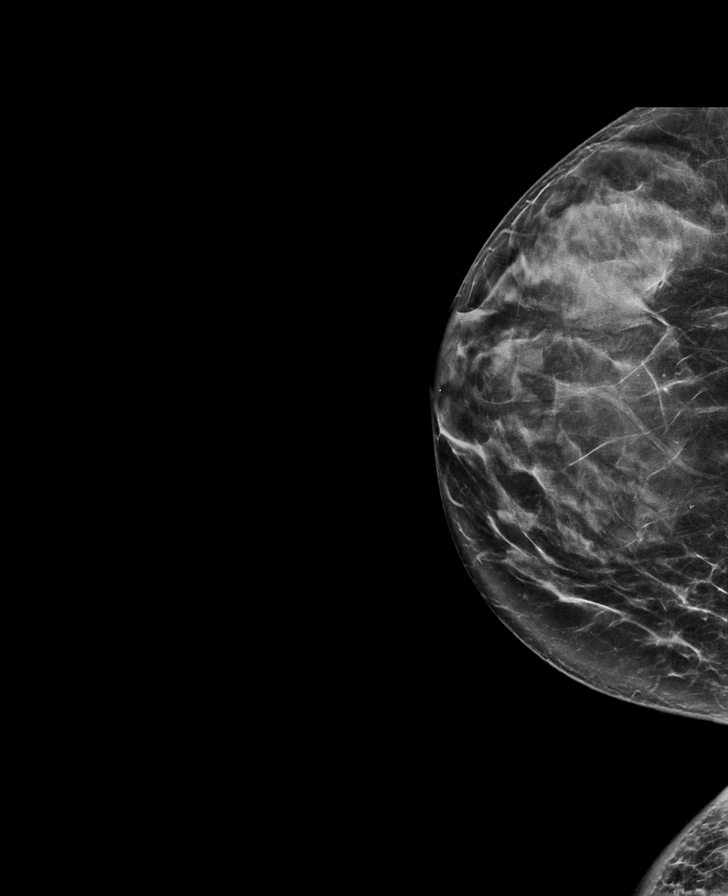

[L CC synth-2D]
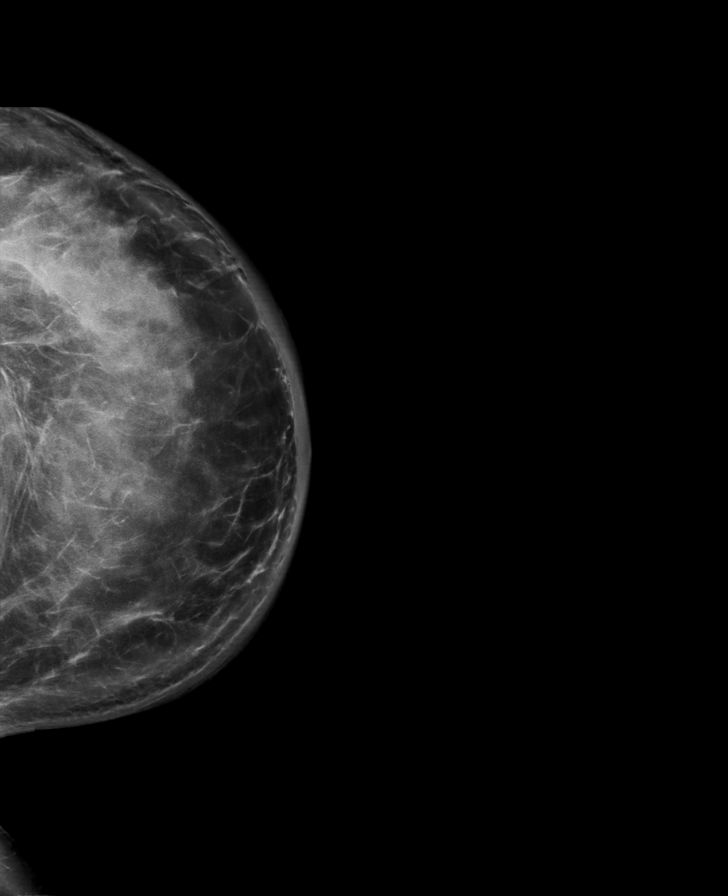

[L MLO synth-2D]
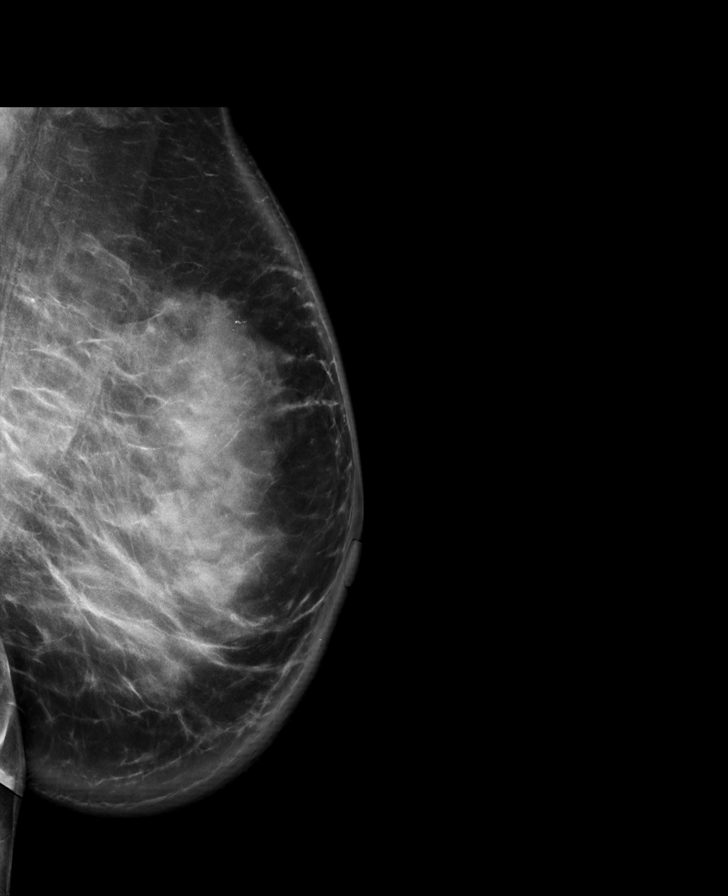

[L TAN synth-2D]
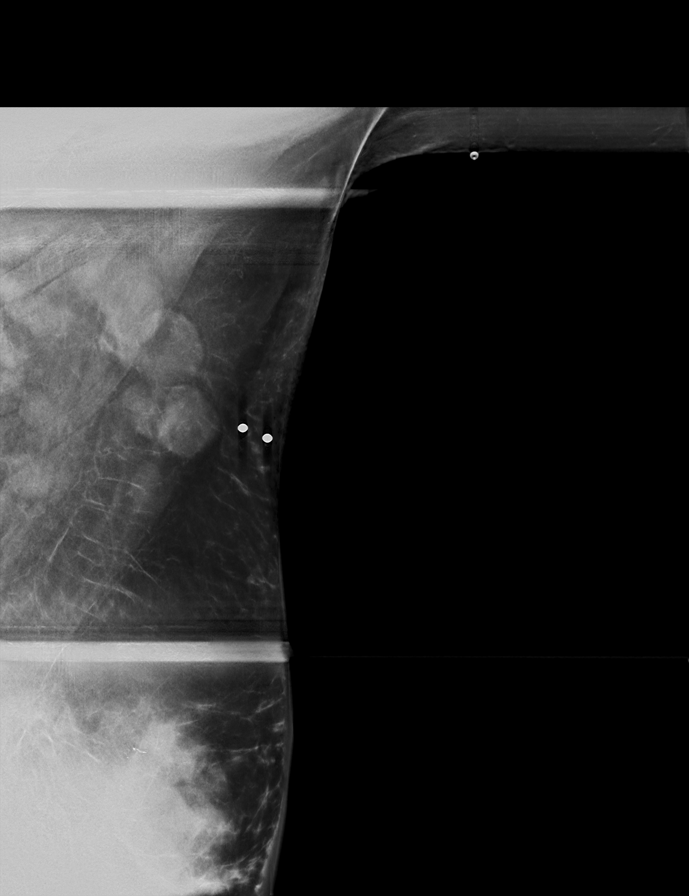

[7 of 37 positions shown; findings below may reference images not displayed]

ACR Breast Density Category c: The breast tissue is heterogeneously
dense, which may obscure small masses.
FINDINGS: Mammogram:

Right breast: No suspicious mass, distortion, or microcalcifications
are identified to suggest presence of malignancy.

Left breast: There is a large masslike asymmetry involving upper
outer and central left breast measuring approximately 9 cm. There
are suspicious calcifications in the upper outer left breast
spanning approximately 1.8 cm. Magnified views could not be
performed as the breast could not be adequately compressed. There is
diffuse skin thickening. There are multiple abnormal lymph nodes
identified in the left axilla.

On physical exam there abnormal firmness with skin thickening
throughout the upper outer and upper central part of the left
breast.

Ultrasound:

Targeted ultrasound performed throughout the left breast
demonstrating multiple abnormal masslike areas predominantly
involving the upper outer and upper inner aspect of the breast.
There are additional smaller areas for example at 8 o'clock
measuring 1.3 cm and at 6 o'clock measuring 1.4 cm which are
suspicious as well. The larger areas in the superior breast are
difficult to measure due to irregular margins and shadowing.

There are at least 5 abnormal axillary lymph nodes.
IMPRESSION: 1. Large highly suspicious masslike area involving the upper outer
and upper central left breast spanning approximately 9 cm
mammographically. There are some of suspicious associated
calcifications. There are additional suspicious areas in the
inferior and lower inner quadrants on ultrasound. There is also
diffuse skin thickening. Findings raise concern for inflammatory
breast cancer.

2.  At least 5 abnormal left axillary lymph nodes.

3.  No mammographic evidence of malignancy in the right breast.

RECOMMENDATION:
1. Ultrasound-guided core needle biopsy x2 of the left breast.
Consider targeting the 2 o'clock and 11 o'clock or 8 o'clock areas.

2.  Ultrasound-guided core needle biopsy x1 of the left axilla.

I have discussed the findings and recommendations with the patient
who agrees to proceed with biopsy. The patient will be scheduled for
the biopsy appointment prior to leaving the office today.

BI-RADS CATEGORY  5: Highly suggestive of malignancy.
ACR Breast Density Category c: The breast tissue is heterogeneously
dense, which may obscure small masses.
FINDINGS: Mammogram:

Right breast: No suspicious mass, distortion, or microcalcifications
are identified to suggest presence of malignancy.

Left breast: There is a large masslike asymmetry involving upper
outer and central left breast measuring approximately 9 cm. There
are suspicious calcifications in the upper outer left breast
spanning approximately 1.8 cm. Magnified views could not be
performed as the breast could not be adequately compressed. There is
diffuse skin thickening. There are multiple abnormal lymph nodes
identified in the left axilla.

On physical exam there abnormal firmness with skin thickening
throughout the upper outer and upper central part of the left
breast.

Ultrasound:

Targeted ultrasound performed throughout the left breast
demonstrating multiple abnormal masslike areas predominantly
involving the upper outer and upper inner aspect of the breast.
There are additional smaller areas for example at 8 o'clock
measuring 1.3 cm and at 6 o'clock measuring 1.4 cm which are
suspicious as well. The larger areas in the superior breast are
difficult to measure due to irregular margins and shadowing.

There are at least 5 abnormal axillary lymph nodes.
IMPRESSION: 1. Large highly suspicious masslike area involving the upper outer
and upper central left breast spanning approximately 9 cm
mammographically. There are some of suspicious associated
calcifications. There are additional suspicious areas in the
inferior and lower inner quadrants on ultrasound. There is also
diffuse skin thickening. Findings raise concern for inflammatory
breast cancer.

2.  At least 5 abnormal left axillary lymph nodes.

3.  No mammographic evidence of malignancy in the right breast.

RECOMMENDATION:
1. Ultrasound-guided core needle biopsy x2 of the left breast.
Consider targeting the 2 o'clock and 11 o'clock or 8 o'clock areas.

2.  Ultrasound-guided core needle biopsy x1 of the left axilla.

I have discussed the findings and recommendations with the patient
who agrees to proceed with biopsy. The patient will be scheduled for
the biopsy appointment prior to leaving the office today.

BI-RADS CATEGORY  5: Highly suggestive of malignancy.

## 2023-06-10 ENCOUNTER — Other Ambulatory Visit: Payer: Self-pay

## 2023-06-10 ENCOUNTER — Encounter: Payer: Self-pay | Admitting: Adult Health

## 2023-06-10 ENCOUNTER — Inpatient Hospital Stay: Payer: Medicaid Other | Attending: Adult Health | Admitting: Adult Health

## 2023-06-10 ENCOUNTER — Inpatient Hospital Stay: Payer: Medicaid Other | Admitting: Adult Health

## 2023-06-10 VITALS — BP 119/53 | HR 62 | Temp 97.9°F | Resp 18 | Ht 70.0 in | Wt 235.9 lb

## 2023-06-10 DIAGNOSIS — Z79810 Long term (current) use of selective estrogen receptor modulators (SERMs): Secondary | ICD-10-CM | POA: Insufficient documentation

## 2023-06-10 DIAGNOSIS — Z114 Encounter for screening for human immunodeficiency virus [HIV]: Secondary | ICD-10-CM

## 2023-06-10 DIAGNOSIS — C50412 Malignant neoplasm of upper-outer quadrant of left female breast: Secondary | ICD-10-CM | POA: Diagnosis present

## 2023-06-10 DIAGNOSIS — Z9221 Personal history of antineoplastic chemotherapy: Secondary | ICD-10-CM | POA: Diagnosis not present

## 2023-06-10 DIAGNOSIS — Z923 Personal history of irradiation: Secondary | ICD-10-CM | POA: Diagnosis not present

## 2023-06-10 DIAGNOSIS — Z170 Estrogen receptor positive status [ER+]: Secondary | ICD-10-CM | POA: Diagnosis not present

## 2023-06-10 DIAGNOSIS — Z9013 Acquired absence of bilateral breasts and nipples: Secondary | ICD-10-CM | POA: Insufficient documentation

## 2023-06-10 DIAGNOSIS — C7951 Secondary malignant neoplasm of bone: Secondary | ICD-10-CM | POA: Diagnosis present

## 2023-06-10 DIAGNOSIS — Z87891 Personal history of nicotine dependence: Secondary | ICD-10-CM | POA: Insufficient documentation

## 2023-06-10 LAB — HIV ANTIBODY (ROUTINE TESTING W REFLEX): HIV Screen 4th Generation wRfx: NONREACTIVE
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
## 2023-06-10 NOTE — Assessment & Plan Note (Signed)
10/04/2021: Palpable left breast mass and left breast swelling for 2 months, mammogram revealed 9 cm abnormality at 2 o'clock position plus another mass at 8:00 measuring 1.3 cm: 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 Bone scan 10/23/2021: No evidence of bony metastases.  Left mandible activity benign   Treatment plan: 1.  Neoadjuvant chemotherapy with dose dense Adriamycin and Cytoxan followed by Taxol 2. modified radical mastectomy 3.  Adjuvant radiation 4.  Adjuvant antiestrogen therapy (declined ovarian suppression + Verzenio, on Tamoxifen) ------------------------------------------------------------------------------------------------------------------------------------------- Current treatment: Tamoxifen daily  Alicia Morton has no clinical or radiographic signs of breast cancer recurrence.  She will continue with a breast exam every 6 months since she is status post bilateral mastectomies along with Signatera testing which is due next month.  Tamoxifen side effects: Hot flashes--tolerable   She has short term memory loss, unclear if related to tamoxifen.  She has stopped taking vitamin b12, since she is now eating a vegan diet, I recommended that she restart it.    I congratulated her on her healthy diet and exercising 30 minutes most days of the week.  I recommended she incorporate strength training slowly into her exercise regimen.   CIPN: stable, not significantly impacting her ADLs.    She will RTC in 6 months for f/u with Dr. Pamelia Hoit.  She has not undergone HIV screening, so we will draw this today and forward results to Dr. Shan Levans, her PCP.

## 2023-06-11 NOTE — Progress Notes (Signed)
Let patient know an HIV study was done and was normal negative

## 2023-06-13 ENCOUNTER — Telehealth: Payer: Self-pay

## 2023-06-13 NOTE — Telephone Encounter (Signed)
Pt was called and is aware of results, DOB was confirmed.  ?

## 2023-06-13 NOTE — Telephone Encounter (Signed)
-----   Message from [name], MD sent at 06/11/2023 12:52 PM EDT ----- Let patient know an HIV study was done and was normal negative

## 2023-07-07 LAB — SIGNATERA
SIGNATERA MTM READOUT: 0 MTM/ml
SIGNATERA TEST RESULT: NEGATIVE

## 2023-07-08 ENCOUNTER — Telehealth: Payer: Self-pay

## 2023-07-08 NOTE — Telephone Encounter (Signed)
Called pt per MD to advise [facility] testing was negative/not detected. Pt verbalized understanding of results and knows [facility] will be in touch to schedule 3 mo repeat lab.   

## 2023-08-26 ENCOUNTER — Ambulatory Visit: Payer: Medicaid Other | Attending: Hematology and Oncology

## 2023-08-26 VITALS — Wt 233.5 lb

## 2023-08-26 DIAGNOSIS — Z483 Aftercare following surgery for neoplasm: Secondary | ICD-10-CM | POA: Insufficient documentation
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
## 2023-09-30 NOTE — Progress Notes (Deleted)
Established Patient Office Visit  Subjective   Patient ID: Alicia Morton, female    DOB: 02/28/1981  Age: 42 y.o. MRN: [id number]  No chief complaint on file.   12/2022 This is a pleasant 42 year old female I have not seen since 2022   The patient developed breast cancer in the fall 2022 and was very busy with seeing oncology and general surgery.  Below is documentations from the most recent oncology visit. Follows with Onc for breast cancer RX:  Malignant neoplasm of upper-outer quadrant of left breast in female, estrogen receptor positive (HCC) 10/04/2021: Palpable left breast mass and left breast swelling for 2 months, mammogram revealed 9 cm abnormality at 2 o'clock position plus another mass at 8:00 measuring 1.3 cm: 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 Bone scan 10/23/2021: No evidence of bony metastases.  Left mandible activity benign   Treatment plan: 1.  Neoadjuvant chemotherapy with dose dense Adriamycin and Cytoxan followed by Taxol completed 03/23/2022 2. 04/04/2022:Bilateral mastectomies Right mastectomy: Benign Left mastectomy: Negative for residual cancer (pathologic complete response) Dr. Corliss Skains removed a lot of tissue from the axilla but there were no viable lymph nodes. 3.  Adjuvant radiation completed 06/11/2022 4.  Adjuvant antiestrogen therapy with ovarian function suppression plus + AI + Verzinio (we will discuss this option versus tamoxifen) -------------------------------------------------------------------------------------------------------------------------------------------   Leg swelling: Currently on Lasix. Chemo-induced peripheral neuropathy: Monitoring   Current treatment: Tamoxifen 20 mg daily started 06/14/2022    Tamoxifen toxicities: 1. Hot flashes 2. Muscle cramps   Breast cancer surveillance: 1. Breast exam 01/09/2023: Benign, Bil mastectomy scars 2. No role of imaging study since she had bilateral mastectomies   Return to clinic in 1 year for follow-up       No orders of the defined types were placed in this encounter.   The patient has a good understanding of the overall plan. she agrees with it. she will call with any problems that may develop before the next visit here. Total time spent: 30 mins including face to face time and time spent for planning, charting and co-ordination of care    Tamsen Meek, MD  Patient now is cancer free.  Recent genetic testing had improved.  Patient has gained some weight and is not following a healthy diet.  Blood pressure on arrival is good 112/74.  Patient does needs some health screenings.  She is now on tamoxifen.  10/15 Back knee      Review of Systems  Constitutional:  Negative for chills, diaphoresis, fever, malaise/fatigue and weight loss.  HENT:  Negative for congestion, hearing loss, nosebleeds, sore throat and tinnitus.   Eyes:  Negative for blurred vision, photophobia and redness.  Respiratory:  Negative for cough, hemoptysis, sputum production, shortness of breath, wheezing and stridor.   Cardiovascular:  Negative for chest pain, palpitations, orthopnea, claudication, leg swelling and PND.  Gastrointestinal:  Negative for abdominal pain, blood in stool, constipation, diarrhea, heartburn, nausea and vomiting.  Genitourinary:  Negative for dysuria, flank pain, frequency, hematuria and urgency.  Musculoskeletal:  Negative for back pain, falls, joint pain, myalgias and neck pain.  Skin:  Negative for itching and rash.  Neurological:  Negative for dizziness, tingling, tremors, sensory change, speech change, focal weakness, seizures, loss of  consciousness, weakness and headaches.  Endo/Heme/Allergies:  Negative for environmental allergies  and polydipsia. Does not bruise/bleed easily.  Psychiatric/Behavioral:  Negative for depression, memory loss, substance abuse and suicidal ideas. The patient is not nervous/anxious and does not have insomnia.       Objective:     There were no vitals taken for this visit.   Physical Exam Vitals reviewed.  Constitutional:      Appearance: Normal appearance. She is well-developed. She is obese. She is not diaphoretic.  HENT:     Head: Normocephalic and atraumatic.     Nose: No nasal deformity, septal deviation, mucosal edema or rhinorrhea.     Right Sinus: No maxillary sinus tenderness or frontal sinus tenderness.     Left Sinus: No maxillary sinus tenderness or frontal sinus tenderness.     Mouth/Throat:     Pharynx: No oropharyngeal exudate.  Eyes:     General: No scleral icterus.    Conjunctiva/sclera: Conjunctivae normal.     Pupils: Pupils are equal, round, and reactive to light.  Neck:     Thyroid: No thyromegaly.     Vascular: No carotid bruit or JVD.     Trachea: Trachea normal. No tracheal tenderness or tracheal deviation.  Cardiovascular:     Rate and Rhythm: Normal rate and regular rhythm.     Chest Wall: PMI is not displaced.     Pulses: Normal pulses. No decreased pulses.     Heart sounds: Normal heart sounds, S1 normal and S2 normal. Heart sounds not distant. No murmur heard.    No systolic murmur is present.     No diastolic murmur is present.     No friction rub. No gallop. No S3 or S4 sounds.  Pulmonary:     Effort: No tachypnea, accessory muscle usage or respiratory distress.     Breath sounds: No stridor. No decreased breath sounds, wheezing, rhonchi or rales.  Chest:     Chest wall: No tenderness.  Breasts:    Right: Absent.     Left: Absent.     Comments: Status post bilateral mastectomies Abdominal:     General: Bowel sounds are normal. There is no distension.     Palpations: Abdomen is soft. Abdomen is not rigid.     Tenderness: There is no  abdominal tenderness. There is no guarding or rebound.  Musculoskeletal:        General: Normal range of motion.     Cervical back: Normal range of motion and neck supple. No edema, erythema or rigidity. No muscular tenderness. Normal range of motion.  Lymphadenopathy:     Head:     Right side of head: No submental or submandibular adenopathy.     Left side of head: No submental or submandibular adenopathy.     Cervical: No cervical adenopathy.  Skin:    General: Skin is warm and dry.     Coloration: Skin is not pale.     Findings: No rash.     Nails: There is no clubbing.  Neurological:     Mental Status: She is alert and oriented to person, place, and time.     Sensory: No sensory deficit.  Psychiatric:        Speech: Speech normal.        Behavior: Behavior normal.     No results found for any visits on 10/01/23.    The 10-year ASCVD risk score (Arnett DK, et al., 2019) is: 0.4%    Assessment &  Plan:   Problem List Items Addressed This Visit   None  30 minutes spent for chart review catching up with patient's multisystem issues patient education  No follow-ups on file.    Shan Levans, MD

## 2023-10-01 ENCOUNTER — Ambulatory Visit: Payer: Medicaid Other | Admitting: Critical Care Medicine

## 2023-10-22 ENCOUNTER — Other Ambulatory Visit: Payer: Self-pay | Admitting: *Deleted

## 2023-10-22 DIAGNOSIS — C50412 Malignant neoplasm of upper-outer quadrant of left female breast: Secondary | ICD-10-CM

## 2023-10-22 NOTE — Progress Notes (Signed)
Updated yearly orders for Signatera testing placed.

## 2023-12-09 ENCOUNTER — Ambulatory Visit: Payer: Medicaid Other | Attending: Hematology and Oncology

## 2023-12-09 DIAGNOSIS — Z483 Aftercare following surgery for neoplasm: Secondary | ICD-10-CM | POA: Insufficient documentation

## 2023-12-17 ENCOUNTER — Encounter: Payer: Self-pay | Admitting: Critical Care Medicine

## 2023-12-19 ENCOUNTER — Other Ambulatory Visit: Payer: Self-pay | Admitting: Family Medicine

## 2023-12-19 ENCOUNTER — Other Ambulatory Visit: Payer: Self-pay | Admitting: *Deleted

## 2023-12-19 DIAGNOSIS — J4521 Mild intermittent asthma with (acute) exacerbation: Secondary | ICD-10-CM

## 2023-12-19 MED ORDER — ALBUTEROL SULFATE (2.5 MG/3ML) 0.083% IN NEBU
2.5000 mg | INHALATION_SOLUTION | Freq: Four times a day (QID) | RESPIRATORY_TRACT | 1 refills | Status: DC | PRN
Start: 2023-12-19 — End: 2024-02-13

## 2023-12-19 MED ORDER — PREDNISONE 20 MG PO TABS: 20.0000 mg | ORAL_TABLET | Freq: Every day | ORAL | 0 refills | Status: DC

## 2023-12-19 MED ORDER — ALBUTEROL SULFATE HFA 108 (90 BASE) MCG/ACT IN AERS
1.0000 | INHALATION_SPRAY | Freq: Four times a day (QID) | RESPIRATORY_TRACT | 0 refills | Status: DC | PRN
Start: 2023-12-19 — End: 2024-02-13

## 2024-01-10 ENCOUNTER — Other Ambulatory Visit: Payer: Self-pay | Admitting: Critical Care Medicine

## 2024-01-10 DIAGNOSIS — J4521 Mild intermittent asthma with (acute) exacerbation: Secondary | ICD-10-CM

## 2024-01-10 NOTE — Telephone Encounter (Signed)
Called [facility] pharmacy to verify if patient has picked up medication refill on 12/19/23. Pharmacy staff Hessie Diener, confirmed patient has already picked up Rx. Duplicate request
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
## 2024-01-11 LAB — SIGNATERA ONLY (NATERA MANAGED)
SIGNATERA MTM READOUT: 0 MTM/ml
SIGNATERA TEST RESULT: NEGATIVE

## 2024-01-13 ENCOUNTER — Inpatient Hospital Stay: Payer: Medicaid Other | Attending: Hematology and Oncology | Admitting: Hematology and Oncology

## 2024-01-13 VITALS — BP 121/56 | HR 65 | Temp 98.0°F | Resp 18 | Ht 70.0 in | Wt 242.3 lb

## 2024-01-13 DIAGNOSIS — C7951 Secondary malignant neoplasm of bone: Secondary | ICD-10-CM | POA: Diagnosis not present

## 2024-01-13 DIAGNOSIS — Z170 Estrogen receptor positive status [ER+]: Secondary | ICD-10-CM | POA: Insufficient documentation

## 2024-01-13 DIAGNOSIS — Z79810 Long term (current) use of selective estrogen receptor modulators (SERMs): Secondary | ICD-10-CM | POA: Diagnosis not present

## 2024-01-13 DIAGNOSIS — Z9221 Personal history of antineoplastic chemotherapy: Secondary | ICD-10-CM | POA: Insufficient documentation

## 2024-01-13 DIAGNOSIS — Z9013 Acquired absence of bilateral breasts and nipples: Secondary | ICD-10-CM | POA: Diagnosis not present

## 2024-01-13 DIAGNOSIS — Z923 Personal history of irradiation: Secondary | ICD-10-CM | POA: Insufficient documentation

## 2024-01-13 DIAGNOSIS — Z7952 Long term (current) use of systemic steroids: Secondary | ICD-10-CM | POA: Diagnosis not present

## 2024-01-13 DIAGNOSIS — C50412 Malignant neoplasm of upper-outer quadrant of left female breast: Secondary | ICD-10-CM | POA: Diagnosis not present

## 2024-01-13 DIAGNOSIS — Z87891 Personal history of nicotine dependence: Secondary | ICD-10-CM | POA: Insufficient documentation

## 2024-01-13 NOTE — Research (Signed)
[id number]: COGNITIVE TRAINING FOR CANCER RELATED COGNITIVE IMPAIRMENT IN BREAST CANCER SURVIVORS: A MULTI-CENTER RANDOMIZED DOUBLE- BLINDED CONTROLLED TRIAL   Met with patient to discuss above study. She is interested in pursuing enrollment. Discussed screening list; she is ineligible due to history of ADHD. Thanked her for her time and interest and provided my contact information in case of questions.  Margret Chance Deangleo Passage, RN, BSN, [facility] She  Her  [facility] Clinical Research Nurse [facility] Direct Dial [phone] 01/13/2024 11:58 AM

## 2024-01-13 NOTE — Assessment & Plan Note (Signed)
10/04/2021: Palpable left breast mass and left breast swelling for 2 months, mammogram revealed 9 cm abnormality at 2 o'clock position plus another mass at 8:00 measuring 1.3 cm: 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 Bone scan 10/23/2021: No evidence of bony metastases.  Left mandible activity benign   Treatment plan: 1.  Neoadjuvant chemotherapy with dose dense Adriamycin and Cytoxan followed by Taxol 2. modified radical mastectomy 3.  Adjuvant radiation 4.  Adjuvant antiestrogen therapy (declined ovarian suppression + Verzenio, on Tamoxifen) ------------------------------------------------------------------------------------------------------------------------------------------- Current treatment: Tamoxifen daily Tamoxifen toxicities: Hot flashes but tolerable Memory loss: Recommended participation memory loss clinical trial Chemo-induced peripheral neuropathy: Stable  Return to clinic in 1 year for follow-up

## 2024-01-13 NOTE — Progress Notes (Signed)
Patient Care Team: [name], MD as PCP - General (Pulmonary Disease) Serena Croissant, MD as Consulting Physician (Hematology and Oncology) Manus Rudd, MD as Consulting Physician (General Surgery) Lonie Peak, MD as Attending Physician (Radiation Oncology)  DIAGNOSIS:  Encounter Diagnosis  Name Primary?   Malignant neoplasm of upper-outer quadrant of left breast in female, estrogen receptor positive (HCC) Yes    SUMMARY OF ONCOLOGIC HISTORY: Oncology History  Malignant neoplasm of upper-outer quadrant of left breast in female, estrogen receptor positive (HCC)  10/04/2021 Initial Diagnosis   Palpable left breast mass and left breast swelling for 2 months, mammogram revealed 9 cm abnormality at 2 o'clock position plus another mass at 8:00 measuring 1.3 cm: Biopsy of both revealed grade 3 IDC ER 95%, PR 0%, HER2 negative, Ki-67 40%, 5 abnormal lymph nodes: Biopsy of 1 was positive (rib metastases seen on mammogram)   10/11/2021 Cancer Staging   Staging form: Breast, AJCC 8th Edition - Clinical stage from 10/11/2021: Stage IIIB (cT3, cN1, cM0, G3, ER+, PR-, HER2-) - Signed by [name], NP on 06/10/2023 Stage prefix: Initial diagnosis Histologic grading system: 3 grade system   10/20/2021 Genetic Testing   Negative hereditary cancer genetic testing: no pathogenic variants detected in [facility] Breast Cancer STAT Panel or Multi-Cancer +RNA Panel.  Variants of uncertain significance detected in POT1 at c.476T>C (p.Met159Thr) and PRKAR1A at c.27T>G (p.Ser9Arg).  The report dates are October 19, 2021 and October 29, 2021, respectively.   The Multi-Cancer + RNA Panel offered by [facility] includes sequencing and/or deletion/duplication analysis of the following 84 genes:  AIP*, ALK, APC*, ATM*, AXIN2*, BAP1*, BARD1*, BLM*, BMPR1A*, BRCA1*, BRCA2*, BRIP1*, CASR, CDC73*, CDH1*, CDK4, CDKN1B*, CDKN1C*, CDKN2A, CEBPA, CHEK2*, CTNNA1*, DICER1*, DIS3L2*, EGFR, EPCAM, FH*, FLCN*,  GATA2*, GPC3, GREM1, HOXB13, HRAS, KIT, MAX*, MEN1*, MET, MITF, MLH1*, MSH2*, MSH3*, MSH6*, MUTYH*, NBN*, NF1*, NF2*, NTHL1*, PALB2*, PDGFRA, PHOX2B, PMS2*, POLD1*, POLE*, POT1*, PRKAR1A*, PTCH1*, PTEN*, RAD50*, RAD51C*, RAD51D*, RB1*, RECQL4, RET, RUNX1*, SDHA*, SDHAF2*, SDHB*, SDHC*, SDHD*, SMAD4*, SMARCA4*, SMARCB1*, SMARCE1*, STK11*, SUFU*, TERC, TERT, TMEM127*, Tp53*, TSC1*, TSC2*, VHL*, WRN*, and WT1.  RNA analysis is performed for * genes.   11/01/2021 - 03/12/2022 Chemotherapy   Patient is on Treatment Plan : BREAST ADJUVANT DOSE DENSE AC q14d / PACLitaxel q7d     04/04/2022 Surgery   Bilateral mastectomies Right mastectomy: Benign Left mastectomy: Negative for residual cancer (pathologic complete response)   05/07/2022 - 06/11/2022 Radiation Therapy   Site Technique Total Dose (Gy) Dose per Fx (Gy) Completed Fx Beam Energies  Chest Wall, Left: CW_L 3D 50/50 2 25/25 10X  Chest Wall, Left: CW_L_SCV_PAB 3D 50/50 2 25/25 10X     06/2022 -  Anti-estrogen oral therapy   Tamoxifen   04/05/2023 Cancer Staging   Staging form: Breast, AJCC 8th Edition - Pathologic stage from 04/05/2023: No Stage Recommended (ypT0, pN0, cM0) - Signed by [name], NP on 06/10/2023 Stage prefix: Post-therapy     CHIEF COMPLIANT: Follow-up on tamoxifen therapy  HISTORY OF PRESENT ILLNESS:   History of Present Illness   The patient, diagnosed with breast cancer approximately two years ago, has been on tamoxifen for about one and a half years. She reports experiencing moderate hot flashes and joint stiffness, which she manages independently. She also mentions ongoing memory issues, which she believes are improving over time. These memory issues were initially associated with chemotherapy treatment.  The patient also reports muscle spasms in the chest area. She has a significant keloid formation post-surgery,  which is more pronounced on one side. She expresses a desire to seek further treatment for  the keloid and the associated tightness.  In addition to the tamoxifen, the patient is also taking prednisone and albuterol. She has expressed interest in participating in a non-medication based study aimed at improving memory through game theory.  The patient also discusses her pre-workout routine, which includes a caffeinated drink. She reports that she does not consume coffee on the days she takes this pre-workout drink. The patient finds this routine beneficial for her energy levels during workouts.         ALLERGIES:  has no known allergies.  MEDICATIONS:  Current Outpatient Medications  Medication Sig Dispense Refill   albuterol (PROVENTIL) (2.5 MG/3ML) 0.083% nebulizer solution Take 3 mLs (2.5 mg total) by nebulization every 6 (six) hours as needed for wheezing or shortness of breath. Needs an office visit for additional refills 150 mL 1   albuterol (VENTOLIN HFA) 108 (90 Base) MCG/ACT inhaler Inhale 1-2 puffs into the lungs every 6 (six) hours as needed for wheezing or shortness of breath. 18 each 0   Fish Oil-Cholecalciferol (FISH OIL + D3) 1000-1000 MG-UNIT CAPS Take by mouth.     Multiple Vitamins-Minerals (MULTIVITAMIN WITH MINERALS) tablet Take 1 tablet by mouth daily.     predniSONE (DELTASONE) 20 MG tablet Take 1 tablet (20 mg total) by mouth daily with breakfast. 5 tablet 0   tamoxifen (NOLVADEX) 20 MG tablet TAKE 1 TABLET BY MOUTH EVERY DAY 90 tablet 3   No current facility-administered medications for this visit.    PHYSICAL EXAMINATION: ECOG PERFORMANCE STATUS: 1 - Symptomatic but completely ambulatory  Vitals:   01/13/24 1114  BP: (!) 121/56  Pulse: 65  Resp: 18  Temp: 98 F (36.7 C)  SpO2: 98%   Filed Weights   01/13/24 1114  Weight: 242 lb 4.8 oz (109.9 kg)    Physical Exam   BREAST: Significant lymphedema noted on one side, described as 'pretty tight' indicating edema. No abnormal masses or tenderness. SKIN: Significant keloid noted, with asymmetry  between sides.      (exam performed in the presence of a chaperone)  LABORATORY DATA:  I have reviewed the data as listed    Latest Ref Rng & Units 01/15/2023   11:17 AM 04/05/2022    2:17 AM 04/02/2022    9:48 AM  CMP  Glucose 70 - 99 mg/dL 73  409  90   BUN 6 - 24 mg/dL 10  10  11    Creatinine 0.57 - 1.00 mg/dL 8.11  9.14  7.82   Sodium 134 - 144 mmol/L 140  139  138   Potassium 3.5 - 5.2 mmol/L 4.0  3.9  3.8   Chloride 96 - 106 mmol/L 102  107  105   CO2 20 - 29 mmol/L 23  25  28    Calcium 8.7 - 10.2 mg/dL 9.3  8.8  9.0   Total Protein 6.0 - 8.5 g/dL 7.7   7.1   Total Bilirubin 0.0 - 1.2 mg/dL 0.3   0.4   Alkaline Phos 44 - 121 IU/L 71   67   AST 0 - 40 IU/L 20   14   ALT 0 - 32 IU/L 11   10     Lab Results  Component Value Date   WBC 6.6 01/15/2023   HGB 11.6 01/15/2023   HCT 37.4 01/15/2023   MCV 86 01/15/2023   PLT 316 01/15/2023  NEUTROABS 4.1 01/15/2023    ASSESSMENT & PLAN:  Malignant neoplasm of upper-outer quadrant of left breast in female, estrogen receptor positive (HCC) 10/04/2021: Palpable left breast mass and left breast swelling for 2 months, mammogram revealed 9 cm abnormality at 2 o'clock position plus another mass at 8:00 measuring 1.3 cm: 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 Bone scan 10/23/2021: No evidence of bony metastases.  Left mandible activity benign   Treatment plan: 1.  Neoadjuvant chemotherapy with dose dense Adriamycin and Cytoxan followed by Taxol 2.   04/04/2022: Bilateral mastectomies; Right mastectomy: Benign, Left mastectomy: Negative for residual cancer (pathologic complete response) 3.  Adjuvant radiation completed 06/11/2022 4.  Adjuvant antiestrogen therapy (declined ovarian suppression + Verzenio,  on Tamoxifen) ------------------------------------------------------------------------------------------------------------------------------------------- Current treatment: Tamoxifen daily Tamoxifen toxicities: Hot flashes but tolerable Memory loss: Recommended participation memory loss clinical trial Chemo-induced peripheral neuropathy: Stable  Return to clinic in 1 year for follow-up      Orders Placed This Encounter  Procedures   Ambulatory referral to Physical Therapy    Referral Priority:   Routine    Referral Type:   Physical Medicine    Referral Reason:   Specialty Services Required    Requested Specialty:   Physical Therapy    Number of Visits Requested:   1   The patient has a good understanding of the overall plan. she agrees with it. she will call with any problems that may develop before the next visit here. Total time spent: 30 mins including face to face time and time spent for planning, charting and co-ordination of care   Tamsen Meek, MD 01/13/24

## 2024-01-15 ENCOUNTER — Telehealth: Payer: Self-pay

## 2024-01-15 NOTE — Telephone Encounter (Signed)
Called pt per MD to advise Signatera testing was negative/not detected. Pt verbalized understanding of results and knows [facility] will be in touch to schedule 6 mo repeat lab.
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
## 2024-01-27 ENCOUNTER — Other Ambulatory Visit: Payer: Self-pay

## 2024-01-27 ENCOUNTER — Ambulatory Visit: Payer: Medicaid Other | Attending: Hematology and Oncology | Admitting: Rehabilitation

## 2024-01-27 ENCOUNTER — Encounter: Payer: Self-pay | Admitting: Rehabilitation

## 2024-01-27 DIAGNOSIS — Z483 Aftercare following surgery for neoplasm: Secondary | ICD-10-CM | POA: Diagnosis present

## 2024-01-27 DIAGNOSIS — M25612 Stiffness of left shoulder, not elsewhere classified: Secondary | ICD-10-CM | POA: Insufficient documentation

## 2024-01-27 DIAGNOSIS — R293 Abnormal posture: Secondary | ICD-10-CM | POA: Insufficient documentation

## 2024-01-27 DIAGNOSIS — C50812 Malignant neoplasm of overlapping sites of left female breast: Secondary | ICD-10-CM | POA: Insufficient documentation

## 2024-01-27 DIAGNOSIS — M25611 Stiffness of right shoulder, not elsewhere classified: Secondary | ICD-10-CM | POA: Diagnosis present

## 2024-01-27 DIAGNOSIS — Z170 Estrogen receptor positive status [ER+]: Secondary | ICD-10-CM | POA: Diagnosis present

## 2024-01-27 DIAGNOSIS — C50412 Malignant neoplasm of upper-outer quadrant of left female breast: Secondary | ICD-10-CM | POA: Diagnosis not present

## 2024-02-04 ENCOUNTER — Ambulatory Visit: Payer: Medicaid Other | Admitting: Rehabilitation

## 2024-02-04 ENCOUNTER — Encounter: Payer: Self-pay | Admitting: Rehabilitation

## 2024-02-04 DIAGNOSIS — Z483 Aftercare following surgery for neoplasm: Secondary | ICD-10-CM

## 2024-02-04 DIAGNOSIS — M25612 Stiffness of left shoulder, not elsewhere classified: Secondary | ICD-10-CM

## 2024-02-04 DIAGNOSIS — C50812 Malignant neoplasm of overlapping sites of left female breast: Secondary | ICD-10-CM

## 2024-02-04 DIAGNOSIS — R293 Abnormal posture: Secondary | ICD-10-CM

## 2024-02-04 DIAGNOSIS — M25611 Stiffness of right shoulder, not elsewhere classified: Secondary | ICD-10-CM
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
## 2024-02-13 ENCOUNTER — Ambulatory Visit: Payer: Medicaid Other | Attending: Critical Care Medicine | Admitting: Critical Care Medicine

## 2024-02-13 ENCOUNTER — Ambulatory Visit: Payer: Medicaid Other | Admitting: Rehabilitation

## 2024-02-13 ENCOUNTER — Encounter: Payer: Self-pay | Admitting: Critical Care Medicine

## 2024-02-13 ENCOUNTER — Ambulatory Visit: Payer: Medicaid Other | Admitting: Critical Care Medicine

## 2024-02-13 VITALS — BP 107/72 | HR 82 | Temp 98.3°F | Ht 70.0 in | Wt 249.0 lb

## 2024-02-13 DIAGNOSIS — J4521 Mild intermittent asthma with (acute) exacerbation: Secondary | ICD-10-CM | POA: Diagnosis not present

## 2024-02-13 DIAGNOSIS — G8929 Other chronic pain: Secondary | ICD-10-CM

## 2024-02-13 DIAGNOSIS — Z139 Encounter for screening, unspecified: Secondary | ICD-10-CM

## 2024-02-13 DIAGNOSIS — M5442 Lumbago with sciatica, left side: Secondary | ICD-10-CM | POA: Diagnosis not present

## 2024-02-13 DIAGNOSIS — M25612 Stiffness of left shoulder, not elsewhere classified: Secondary | ICD-10-CM

## 2024-02-13 DIAGNOSIS — M5441 Lumbago with sciatica, right side: Secondary | ICD-10-CM

## 2024-02-13 DIAGNOSIS — C50412 Malignant neoplasm of upper-outer quadrant of left female breast: Secondary | ICD-10-CM

## 2024-02-13 DIAGNOSIS — M25611 Stiffness of right shoulder, not elsewhere classified: Secondary | ICD-10-CM

## 2024-02-13 DIAGNOSIS — Z483 Aftercare following surgery for neoplasm: Secondary | ICD-10-CM

## 2024-02-13 DIAGNOSIS — R293 Abnormal posture: Secondary | ICD-10-CM

## 2024-02-13 DIAGNOSIS — C50812 Malignant neoplasm of overlapping sites of left female breast: Secondary | ICD-10-CM

## 2024-02-13 DIAGNOSIS — E782 Mixed hyperlipidemia: Secondary | ICD-10-CM | POA: Diagnosis not present

## 2024-02-13 DIAGNOSIS — Z170 Estrogen receptor positive status [ER+]: Secondary | ICD-10-CM

## 2024-02-13 MED ORDER — ALBUTEROL SULFATE HFA 108 (90 BASE) MCG/ACT IN AERS: 1.0000 | INHALATION_SPRAY | Freq: Four times a day (QID) | RESPIRATORY_TRACT | 0 refills | Status: DC | PRN

## 2024-02-13 MED ORDER — PREDNISONE 20 MG PO TABS: 20.0000 mg | ORAL_TABLET | Freq: Every day | ORAL | 0 refills | Status: AC

## 2024-02-13 MED ORDER — ALBUTEROL SULFATE (2.5 MG/3ML) 0.083% IN NEBU
2.5000 mg | INHALATION_SOLUTION | Freq: Four times a day (QID) | RESPIRATORY_TRACT | 1 refills | Status: AC | PRN
Start: 2024-02-13 — End: ?

## 2024-02-13 NOTE — Patient Instructions (Signed)
 Labs today CT lumbar spine ordered Medication refilled Return 6 months
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
## 2024-02-14 ENCOUNTER — Encounter: Payer: Self-pay | Admitting: Critical Care Medicine

## 2024-02-14 ENCOUNTER — Telehealth: Payer: Self-pay

## 2024-02-14 DIAGNOSIS — G8929 Other chronic pain: Secondary | ICD-10-CM | POA: Insufficient documentation

## 2024-02-14 LAB — LIPID PANEL
Chol/HDL Ratio: 2.5 {ratio} (ref 0.0–4.4)
Cholesterol, Total: 169 mg/dL (ref 100–199)
HDL: 68 mg/dL (ref >39)
LDL Chol Calc (NIH): 86 mg/dL (ref 0–99)
Triglycerides: 83 mg/dL (ref 0–149)
VLDL Cholesterol Cal: 15 mg/dL (ref 5–40)

## 2024-02-14 LAB — COMPREHENSIVE METABOLIC PANEL
ALT: 15 [IU]/L (ref 0–32)
AST: 18 [IU]/L (ref 0–40)
Albumin: 4.1 g/dL (ref 3.9–4.9)
Alkaline Phosphatase: 82 [IU]/L (ref 44–121)
BUN/Creatinine Ratio: 12 (ref 9–23)
BUN: 8 mg/dL (ref 6–24)
Bilirubin Total: 0.2 mg/dL (ref 0.0–1.2)
CO2: 21 mmol/L (ref 20–29)
Calcium: 9.3 mg/dL (ref 8.7–10.2)
Chloride: 103 mmol/L (ref 96–106)
Creatinine, Ser: 0.69 mg/dL (ref 0.57–1.00)
Globulin, Total: 3.6 g/dL (ref 1.5–4.5)
Glucose: 88 mg/dL (ref 70–99)
Potassium: 4.3 mmol/L (ref 3.5–5.2)
Sodium: 142 mmol/L (ref 134–144)
Total Protein: 7.7 g/dL (ref 6.0–8.5)
eGFR: 110 mL/min/{1.73_m2} (ref >59)

## 2024-02-14 LAB — CBC WITH DIFFERENTIAL/PLATELET
Basophils Absolute: 0.1 10*3/uL (ref 0.0–0.2)
Basos: 1 %
EOS (ABSOLUTE): 0.3 10*3/uL (ref 0.0–0.4)
Eos: 4 %
Hematocrit: 37.3 % (ref 34.0–46.6)
Hemoglobin: 11.5 g/dL (ref 11.1–15.9)
Immature Grans (Abs): 0 10*3/uL (ref 0.0–0.1)
Immature Granulocytes: 0 %
Lymphocytes Absolute: 1.9 10*3/uL (ref 0.7–3.1)
Lymphs: 25 %
MCH: 27 pg (ref 26.6–33.0)
MCHC: 30.8 g/dL — ABNORMAL LOW (ref 31.5–35.7)
MCV: 88 fL (ref 79–97)
Monocytes Absolute: 0.6 10*3/uL (ref 0.1–0.9)
Monocytes: 7 %
Neutrophils Absolute: 4.8 10*3/uL (ref 1.4–7.0)
Neutrophils: 63 %
Platelets: 349 10*3/uL (ref 150–450)
RBC: 4.26 x10E6/uL (ref 3.77–5.28)
RDW: 13.4 % (ref 11.7–15.4)
WBC: 7.7 10*3/uL (ref 3.4–10.8)

## 2024-02-14 NOTE — Assessment & Plan Note (Signed)
 Currently in remission on tamoxifen

## 2024-02-14 NOTE — Progress Notes (Signed)
 Let pt know blood count normal, liver kidney normal, cholesterol normal

## 2024-02-14 NOTE — Telephone Encounter (Signed)
-----   Message from Shan Levans sent at 02/14/2024 11:41 AM EST ----- Let pt know blood count normal, liver kidney normal, cholesterol normal

## 2024-02-14 NOTE — Assessment & Plan Note (Signed)
 Refill backup prednisone refill inhalers
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
## 2024-02-14 NOTE — Telephone Encounter (Signed)
 Pt was called and is aware of results, DOB was confirmed.  ?

## 2024-02-17 ENCOUNTER — Encounter: Payer: Self-pay | Admitting: Critical Care Medicine

## 2024-02-18 ENCOUNTER — Ambulatory Visit: Payer: Self-pay

## 2024-02-19 ENCOUNTER — Ambulatory Visit: Attending: Hematology and Oncology

## 2024-02-19 ENCOUNTER — Telehealth: Payer: Self-pay

## 2024-02-19 ENCOUNTER — Other Ambulatory Visit

## 2024-02-19 DIAGNOSIS — M25611 Stiffness of right shoulder, not elsewhere classified: Secondary | ICD-10-CM | POA: Insufficient documentation

## 2024-02-19 DIAGNOSIS — C50812 Malignant neoplasm of overlapping sites of left female breast: Secondary | ICD-10-CM | POA: Insufficient documentation

## 2024-02-19 DIAGNOSIS — M25612 Stiffness of left shoulder, not elsewhere classified: Secondary | ICD-10-CM | POA: Insufficient documentation

## 2024-02-19 DIAGNOSIS — Z170 Estrogen receptor positive status [ER+]: Secondary | ICD-10-CM | POA: Insufficient documentation

## 2024-02-19 DIAGNOSIS — Z483 Aftercare following surgery for neoplasm: Secondary | ICD-10-CM | POA: Insufficient documentation

## 2024-02-19 DIAGNOSIS — R293 Abnormal posture: Secondary | ICD-10-CM | POA: Insufficient documentation
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
## 2024-02-25 ENCOUNTER — Ambulatory Visit: Payer: Self-pay | Admitting: Physical Therapy

## 2024-02-25 ENCOUNTER — Encounter: Payer: Self-pay | Admitting: Physical Therapy

## 2024-02-25 DIAGNOSIS — M25612 Stiffness of left shoulder, not elsewhere classified: Secondary | ICD-10-CM | POA: Diagnosis present

## 2024-02-25 DIAGNOSIS — R293 Abnormal posture: Secondary | ICD-10-CM

## 2024-02-25 DIAGNOSIS — Z170 Estrogen receptor positive status [ER+]: Secondary | ICD-10-CM

## 2024-02-25 DIAGNOSIS — M25611 Stiffness of right shoulder, not elsewhere classified: Secondary | ICD-10-CM | POA: Diagnosis present

## 2024-02-25 DIAGNOSIS — C50812 Malignant neoplasm of overlapping sites of left female breast: Secondary | ICD-10-CM | POA: Diagnosis present

## 2024-02-25 DIAGNOSIS — Z483 Aftercare following surgery for neoplasm: Secondary | ICD-10-CM | POA: Diagnosis present
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
## 2024-03-04 ENCOUNTER — Ambulatory Visit

## 2024-03-04 DIAGNOSIS — M25611 Stiffness of right shoulder, not elsewhere classified: Secondary | ICD-10-CM

## 2024-03-04 DIAGNOSIS — R293 Abnormal posture: Secondary | ICD-10-CM

## 2024-03-04 DIAGNOSIS — Z170 Estrogen receptor positive status [ER+]: Secondary | ICD-10-CM

## 2024-03-04 DIAGNOSIS — M25612 Stiffness of left shoulder, not elsewhere classified: Secondary | ICD-10-CM

## 2024-03-04 DIAGNOSIS — Z483 Aftercare following surgery for neoplasm: Secondary | ICD-10-CM
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
## 2024-03-10 ENCOUNTER — Telehealth: Payer: Self-pay | Admitting: *Deleted

## 2024-03-10 ENCOUNTER — Other Ambulatory Visit

## 2024-03-10 NOTE — Telephone Encounter (Signed)
 Called [employer] in [place] scheduling. States that it was not approved there was not other reasoning behind it. [employer] did not approve"

## 2024-03-10 NOTE — Telephone Encounter (Signed)
 Copied from CRM [id number]. Topic: General - Other >> Mar 09, 2024 11:27 AM Fredrich Romans wrote: Reason for CRM: [facility] called in to let provider know that they had to cancel patient appointment with them tomorrow,due to insurance reasons.

## 2024-03-11 ENCOUNTER — Ambulatory Visit

## 2024-03-11 DIAGNOSIS — Z170 Estrogen receptor positive status [ER+]: Secondary | ICD-10-CM

## 2024-03-11 DIAGNOSIS — R293 Abnormal posture: Secondary | ICD-10-CM

## 2024-03-11 DIAGNOSIS — M25611 Stiffness of right shoulder, not elsewhere classified: Secondary | ICD-10-CM

## 2024-03-11 DIAGNOSIS — Z483 Aftercare following surgery for neoplasm: Secondary | ICD-10-CM

## 2024-03-11 DIAGNOSIS — M25612 Stiffness of left shoulder, not elsewhere classified: Secondary | ICD-10-CM
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
## 2024-03-16 ENCOUNTER — Ambulatory Visit: Payer: Self-pay | Attending: Hematology and Oncology

## 2024-03-16 DIAGNOSIS — M25612 Stiffness of left shoulder, not elsewhere classified: Secondary | ICD-10-CM | POA: Diagnosis present

## 2024-03-16 DIAGNOSIS — Z483 Aftercare following surgery for neoplasm: Secondary | ICD-10-CM | POA: Diagnosis present

## 2024-03-16 DIAGNOSIS — Z170 Estrogen receptor positive status [ER+]: Secondary | ICD-10-CM | POA: Diagnosis present

## 2024-03-16 DIAGNOSIS — R293 Abnormal posture: Secondary | ICD-10-CM | POA: Diagnosis present

## 2024-03-16 DIAGNOSIS — M25611 Stiffness of right shoulder, not elsewhere classified: Secondary | ICD-10-CM | POA: Diagnosis present

## 2024-03-16 DIAGNOSIS — C50812 Malignant neoplasm of overlapping sites of left female breast: Secondary | ICD-10-CM | POA: Insufficient documentation
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
## 2024-03-18 ENCOUNTER — Ambulatory Visit

## 2024-03-25 ENCOUNTER — Encounter

## 2024-06-04 ENCOUNTER — Telehealth: Payer: Self-pay | Admitting: Adult Health

## 2024-06-04 NOTE — Telephone Encounter (Signed)
 Spoke with pt about rescheduled appt date and time.

## 2024-06-08 ENCOUNTER — Ambulatory Visit: Payer: Self-pay | Attending: Hematology and Oncology

## 2024-06-08 VITALS — Wt 240.4 lb

## 2024-06-08 DIAGNOSIS — Z483 Aftercare following surgery for neoplasm: Secondary | ICD-10-CM | POA: Insufficient documentation
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
## 2024-06-09 ENCOUNTER — Ambulatory Visit: Payer: Medicaid Other | Admitting: Adult Health

## 2024-06-16 ENCOUNTER — Encounter: Payer: Self-pay | Admitting: Adult Health

## 2024-06-16 ENCOUNTER — Inpatient Hospital Stay: Attending: Adult Health | Admitting: Adult Health

## 2024-06-16 VITALS — BP 110/45 | HR 61 | Temp 97.8°F | Resp 18 | Ht 70.0 in | Wt 248.6 lb

## 2024-06-16 DIAGNOSIS — T451X5A Adverse effect of antineoplastic and immunosuppressive drugs, initial encounter: Secondary | ICD-10-CM | POA: Insufficient documentation

## 2024-06-16 DIAGNOSIS — Z87891 Personal history of nicotine dependence: Secondary | ICD-10-CM | POA: Diagnosis not present

## 2024-06-16 DIAGNOSIS — Z7952 Long term (current) use of systemic steroids: Secondary | ICD-10-CM | POA: Insufficient documentation

## 2024-06-16 DIAGNOSIS — Z9221 Personal history of antineoplastic chemotherapy: Secondary | ICD-10-CM | POA: Insufficient documentation

## 2024-06-16 DIAGNOSIS — C7951 Secondary malignant neoplasm of bone: Secondary | ICD-10-CM | POA: Insufficient documentation

## 2024-06-16 DIAGNOSIS — R635 Abnormal weight gain: Secondary | ICD-10-CM | POA: Insufficient documentation

## 2024-06-16 DIAGNOSIS — Z9013 Acquired absence of bilateral breasts and nipples: Secondary | ICD-10-CM | POA: Diagnosis not present

## 2024-06-16 DIAGNOSIS — C50412 Malignant neoplasm of upper-outer quadrant of left female breast: Secondary | ICD-10-CM | POA: Diagnosis present

## 2024-06-16 DIAGNOSIS — G620 Drug-induced polyneuropathy: Secondary | ICD-10-CM | POA: Diagnosis not present

## 2024-06-16 DIAGNOSIS — N939 Abnormal uterine and vaginal bleeding, unspecified: Secondary | ICD-10-CM | POA: Diagnosis not present

## 2024-06-16 DIAGNOSIS — Z170 Estrogen receptor positive status [ER+]: Secondary | ICD-10-CM | POA: Insufficient documentation

## 2024-06-16 DIAGNOSIS — M79605 Pain in left leg: Secondary | ICD-10-CM | POA: Insufficient documentation

## 2024-06-16 DIAGNOSIS — Z923 Personal history of irradiation: Secondary | ICD-10-CM | POA: Diagnosis not present

## 2024-06-16 DIAGNOSIS — Z79810 Long term (current) use of selective estrogen receptor modulators (SERMs): Secondary | ICD-10-CM | POA: Insufficient documentation
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
## 2024-06-17 ENCOUNTER — Ambulatory Visit (HOSPITAL_COMMUNITY)
Admission: RE | Admit: 2024-06-17 | Discharge: 2024-06-17 | Disposition: A | Discharge status: Home / Self Care | Source: Ambulatory Visit | Attending: Vascular Surgery | Admitting: Vascular Surgery

## 2024-06-17 ENCOUNTER — Telehealth: Payer: Self-pay | Admitting: *Deleted

## 2024-06-17 DIAGNOSIS — M79605 Pain in left leg: Secondary | ICD-10-CM

## 2024-06-17 NOTE — Telephone Encounter (Signed)
 Called and made pt aware that she was negative for DVT. Pt verbalized understanding and advised to f/u as scheduled.

## 2024-06-22 NOTE — Assessment & Plan Note (Signed)
 10/04/2021: Palpable left breast mass and left breast swelling for 2 months, mammogram revealed 9 cm abnormality at 2 o'clock position plus another mass at 8:00 measuring 1.3 cm: 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 Bone scan 10/23/2021: No evidence of bony metastases.  Left mandible activity benign   Treatment plan: 1.  Neoadjuvant chemotherapy with dose dense Adriamycin  and Cytoxan  followed by Taxol  2. modified radical mastectomy 3.  Adjuvant radiation 4.  Adjuvant antiestrogen therapy (declined ovarian suppression + Verzenio, on Tamoxifen ) ------------------------------------------------------------------------------------------------------------------------------------------- Current treatment: Tamoxifen  daily Tamoxifen  toxicities: Hot flashes but tolerable Memory loss: Recommended participation memory loss clinical trial Chemo-induced peripheral neuropathy: Stable  Assessment and Plan Assessment & Plan Tamoxifen  therapy Continues tamoxifen  with side effects of weight gain and potential leg pain. Discussed risks of blood clots and need for leg pain evaluation. - Continue tamoxifen  therapy. - Encourage exercise for weight management and recurrence risk reduction. - Discuss dietary modifications to reduce processed foods.  Left leg pain Worsening pain possibly due to blood clot from tamoxifen . Discussed risk of pulmonary embolism. - Order Doppler ultrasound of the left leg to rule out blood clot. - Start magnesium 400 mg at night for leg discomfort.  Weight gain Weight gain linked to tamoxifen  and reduced exercise due to leg pain. Discussed diet and exercise impact. - Encourage exercise for weight management. - Discuss dietary modifications to reduce  processed foods.  Vaginal spotting post-chemotherapy Spotting after a year without menstruation post-chemotherapy, likely hormonal. Discussed holding tamoxifen  if bleeding worsens. - Refer to gynecology for evaluation. - Hold tamoxifen  if bleeding becomes more significant.  RTC in 3 months for f/u.

## 2024-06-24 LAB — SIGNATERA
SIGNATERA MTM READOUT: 0 MTM/ml
SIGNATERA TEST RESULT: NEGATIVE

## 2024-07-27 ENCOUNTER — Other Ambulatory Visit: Payer: Self-pay | Admitting: Hematology and Oncology

## 2024-08-11 ENCOUNTER — Telehealth: Payer: Self-pay | Admitting: Critical Care Medicine

## 2024-08-11 NOTE — Telephone Encounter (Signed)
 Pt unconfirmed appt 8/26 lvm

## 2024-08-12 ENCOUNTER — Encounter: Payer: Self-pay | Admitting: Family Medicine

## 2024-08-12 ENCOUNTER — Ambulatory Visit: Payer: Medicaid Other | Attending: Family Medicine | Admitting: Family Medicine

## 2024-08-12 VITALS — BP 105/69 | HR 66 | Ht 70.0 in | Wt 252.0 lb

## 2024-08-12 DIAGNOSIS — M47892 Other spondylosis, cervical region: Secondary | ICD-10-CM

## 2024-08-12 DIAGNOSIS — M549 Dorsalgia, unspecified: Secondary | ICD-10-CM | POA: Diagnosis not present

## 2024-08-12 DIAGNOSIS — C50412 Malignant neoplasm of upper-outer quadrant of left female breast: Secondary | ICD-10-CM

## 2024-08-12 DIAGNOSIS — Z170 Estrogen receptor positive status [ER+]: Secondary | ICD-10-CM

## 2024-08-12 DIAGNOSIS — Z87891 Personal history of nicotine dependence: Secondary | ICD-10-CM

## 2024-08-12 DIAGNOSIS — M25572 Pain in left ankle and joints of left foot: Secondary | ICD-10-CM

## 2024-08-12 DIAGNOSIS — G620 Drug-induced polyneuropathy: Secondary | ICD-10-CM

## 2024-08-12 DIAGNOSIS — G8929 Other chronic pain: Secondary | ICD-10-CM | POA: Diagnosis not present

## 2024-08-12 MED ORDER — IBUPROFEN 800 MG PO TABS: 800.0000 mg | ORAL_TABLET | Freq: Two times a day (BID) | ORAL | 1 refills | Status: AC | PRN

## 2024-08-12 MED ORDER — GABAPENTIN 300 MG PO CAPS: 300.0000 mg | ORAL_CAPSULE | Freq: Every day | ORAL | 3 refills | Status: AC
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
## 2024-08-12 NOTE — Patient Instructions (Signed)
 Back Exercises These exercises help to make your trunk and back strong. They also help to keep the lower back flexible. Doing these exercises can help to prevent or lessen pain in your lower back. If you have back pain, try to do these exercises 2-3 times each day or as told by your doctor. As you get better, do the exercises once each day. Repeat the exercises more often as told by your doctor. To stop back pain from coming back, do the exercises once each day, or as told by your doctor. Do exercises exactly as told by your doctor. Stop right away if you feel sudden pain or your pain gets worse. Exercises Single knee to chest Do these steps 3-5 times in a row for each leg: Lie on your back on a firm bed or the floor with your legs stretched out. Bring one knee to your chest. Grab your knee or thigh with both hands and hold it in place. Pull on your knee until you feel a gentle stretch in your lower back or butt. Keep doing the stretch for 10-30 seconds. Slowly let go of your leg and straighten it. Pelvic tilt Do these steps 5-10 times in a row: Lie on your back on a firm bed or the floor with your legs stretched out. Bend your knees so they point up to the ceiling. Your feet should be flat on the floor. Tighten your lower belly (abdomen) muscles to press your lower back against the floor. This will make your tailbone point up to the ceiling instead of pointing down to your feet or the floor. Stay in this position for 5-10 seconds while you gently tighten your muscles and breathe evenly. Cat-cow Do these steps until your lower back bends more easily: Get on your hands and knees on a firm bed or the floor. Keep your hands under your shoulders, and keep your knees under your hips. You may put padding under your knees. Let your head hang down toward your chest. Tighten (contract) the muscles in your belly. Point your tailbone toward the floor so your lower back becomes rounded like the back of a  cat. Stay in this position for 5 seconds. Slowly lift your head. Let the muscles of your belly relax. Point your tailbone up toward the ceiling so your back forms a sagging arch like the back of a cow. Stay in this position for 5 seconds.  Press-ups Do these steps 5-10 times in a row: Lie on your belly (face-down) on a firm bed or the floor. Place your hands near your head, about shoulder-width apart. While you keep your back relaxed and keep your hips on the floor, slowly straighten your arms to raise the top half of your body and lift your shoulders. Do not use your back muscles. You may change where you place your hands to make yourself more comfortable. Stay in this position for 5 seconds. Keep your back relaxed. Slowly return to lying flat on the floor.  Bridges Do these steps 10 times in a row: Lie on your back on a firm bed or the floor. Bend your knees so they point up to the ceiling. Your feet should be flat on the floor. Your arms should be flat at your sides, next to your body. Tighten your butt muscles and lift your butt off the floor until your waist is almost as high as your knees. If you do not feel the muscles working in your butt and the back of  your thighs, slide your feet 1-2 inches (2.5-5 cm) farther away from your butt. Stay in this position for 3-5 seconds. Slowly lower your butt to the floor, and let your butt muscles relax. If this exercise is too easy, try doing it with your arms crossed over your chest. Belly crunches Do these steps 5-10 times in a row: Lie on your back on a firm bed or the floor with your legs stretched out. Bend your knees so they point up to the ceiling. Your feet should be flat on the floor. Cross your arms over your chest. Tip your chin a little bit toward your chest, but do not bend your neck. Tighten your belly muscles and slowly raise your chest just enough to lift your shoulder blades a tiny bit off the floor. Avoid raising your body  higher than that because it can put too much stress on your lower back. Slowly lower your chest and your head to the floor. Back lifts Do these steps 5-10 times in a row: Lie on your belly (face-down) with your arms at your sides, and rest your forehead on the floor. Tighten the muscles in your legs and your butt. Slowly lift your chest off the floor while you keep your hips on the floor. Keep the back of your head in line with the curve in your back. Look at the floor while you do this. Stay in this position for 3-5 seconds. Slowly lower your chest and your face to the floor. Contact a doctor if: Your back pain gets a lot worse when you do an exercise. Your back pain does not get better within 2 hours after you exercise. If you have any of these problems, stop doing the exercises. Do not do them again unless your doctor says it is okay. Get help right away if: You have sudden, very bad back pain. If this happens, stop doing the exercises. Do not do them again unless your doctor says it is okay. This information is not intended to replace advice given to you by your health care provider. Make sure you discuss any questions you have with your health care provider. Document Revised: 02/15/2021 Document Reviewed: 02/15/2021 Elsevier Patient Education  2024 [employer].

## 2024-08-21 ENCOUNTER — Other Ambulatory Visit: Payer: Self-pay | Admitting: Family Medicine

## 2024-08-21 ENCOUNTER — Encounter: Payer: Self-pay | Admitting: Critical Care Medicine

## 2024-08-21 MED ORDER — FUROSEMIDE 20 MG PO TABS: 20.0000 mg | ORAL_TABLET | Freq: Every day | ORAL | 1 refills | Status: DC | PRN

## 2024-08-21 NOTE — Telephone Encounter (Signed)
**Note De-identified  [place] Obfuscation** Please advise 

## 2024-08-26 ENCOUNTER — Encounter: Payer: Self-pay | Admitting: Physical Therapy

## 2024-08-26 ENCOUNTER — Ambulatory Visit: Attending: Hematology and Oncology | Admitting: Physical Therapy

## 2024-08-26 ENCOUNTER — Other Ambulatory Visit: Payer: Self-pay

## 2024-08-26 DIAGNOSIS — M25562 Pain in left knee: Secondary | ICD-10-CM | POA: Diagnosis present

## 2024-08-26 DIAGNOSIS — M25572 Pain in left ankle and joints of left foot: Secondary | ICD-10-CM | POA: Diagnosis present

## 2024-08-26 DIAGNOSIS — M5459 Other low back pain: Secondary | ICD-10-CM | POA: Insufficient documentation

## 2024-08-26 DIAGNOSIS — M542 Cervicalgia: Secondary | ICD-10-CM | POA: Diagnosis present

## 2024-08-26 DIAGNOSIS — M549 Dorsalgia, unspecified: Secondary | ICD-10-CM | POA: Insufficient documentation

## 2024-08-26 DIAGNOSIS — M47892 Other spondylosis, cervical region: Secondary | ICD-10-CM | POA: Diagnosis not present

## 2024-08-26 DIAGNOSIS — M25561 Pain in right knee: Secondary | ICD-10-CM | POA: Insufficient documentation

## 2024-08-26 DIAGNOSIS — M6281 Muscle weakness (generalized): Secondary | ICD-10-CM | POA: Insufficient documentation
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
## 2024-09-01 ENCOUNTER — Other Ambulatory Visit (INDEPENDENT_AMBULATORY_CARE_PROVIDER_SITE_OTHER): Payer: Self-pay

## 2024-09-01 ENCOUNTER — Ambulatory Visit (INDEPENDENT_AMBULATORY_CARE_PROVIDER_SITE_OTHER): Admitting: Physician Assistant

## 2024-09-01 DIAGNOSIS — M25572 Pain in left ankle and joints of left foot: Secondary | ICD-10-CM

## 2024-09-01 DIAGNOSIS — M25571 Pain in right ankle and joints of right foot: Secondary | ICD-10-CM
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
## 2024-09-08 ENCOUNTER — Ambulatory Visit
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
## 2024-09-09 ENCOUNTER — Ambulatory Visit

## 2024-09-09 DIAGNOSIS — M6281 Muscle weakness (generalized): Secondary | ICD-10-CM

## 2024-09-09 DIAGNOSIS — M5459 Other low back pain: Secondary | ICD-10-CM

## 2024-09-09 DIAGNOSIS — M542 Cervicalgia: Secondary | ICD-10-CM

## 2024-09-13 ENCOUNTER — Other Ambulatory Visit: Payer: Self-pay | Admitting: Family Medicine

## 2024-09-14 ENCOUNTER — Encounter: Payer: Self-pay | Admitting: Physical Therapy

## 2024-09-14 ENCOUNTER — Ambulatory Visit: Admitting: Physical Therapy

## 2024-09-14 DIAGNOSIS — M5459 Other low back pain: Secondary | ICD-10-CM

## 2024-09-14 DIAGNOSIS — M25561 Pain in right knee: Secondary | ICD-10-CM

## 2024-09-14 DIAGNOSIS — M542 Cervicalgia: Secondary | ICD-10-CM

## 2024-09-14 DIAGNOSIS — M6281 Muscle weakness (generalized): Secondary | ICD-10-CM
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
## 2024-09-17 ENCOUNTER — Ambulatory Visit

## 2024-09-21 ENCOUNTER — Encounter: Payer: Self-pay | Admitting: Physical Therapy

## 2024-09-21 ENCOUNTER — Inpatient Hospital Stay: Attending: Adult Health | Admitting: Adult Health

## 2024-09-21 ENCOUNTER — Encounter: Payer: Self-pay | Admitting: Adult Health

## 2024-09-21 ENCOUNTER — Ambulatory Visit: Attending: Family Medicine | Admitting: Physical Therapy

## 2024-09-21 VITALS — BP 118/64 | HR 59 | Temp 98.1°F | Resp 18 | Ht 70.0 in | Wt 247.0 lb

## 2024-09-21 DIAGNOSIS — Z87891 Personal history of nicotine dependence: Secondary | ICD-10-CM | POA: Diagnosis not present

## 2024-09-21 DIAGNOSIS — M5459 Other low back pain: Secondary | ICD-10-CM | POA: Insufficient documentation

## 2024-09-21 DIAGNOSIS — Z9013 Acquired absence of bilateral breasts and nipples: Secondary | ICD-10-CM | POA: Insufficient documentation

## 2024-09-21 DIAGNOSIS — Z7952 Long term (current) use of systemic steroids: Secondary | ICD-10-CM | POA: Insufficient documentation

## 2024-09-21 DIAGNOSIS — Z923 Personal history of irradiation: Secondary | ICD-10-CM | POA: Diagnosis not present

## 2024-09-21 DIAGNOSIS — T451X5D Adverse effect of antineoplastic and immunosuppressive drugs, subsequent encounter: Secondary | ICD-10-CM | POA: Diagnosis not present

## 2024-09-21 DIAGNOSIS — C50412 Malignant neoplasm of upper-outer quadrant of left female breast: Secondary | ICD-10-CM | POA: Diagnosis present

## 2024-09-21 DIAGNOSIS — M6281 Muscle weakness (generalized): Secondary | ICD-10-CM | POA: Diagnosis present

## 2024-09-21 DIAGNOSIS — M542 Cervicalgia: Secondary | ICD-10-CM | POA: Diagnosis present

## 2024-09-21 DIAGNOSIS — Z9221 Personal history of antineoplastic chemotherapy: Secondary | ICD-10-CM | POA: Insufficient documentation

## 2024-09-21 DIAGNOSIS — G620 Drug-induced polyneuropathy: Secondary | ICD-10-CM | POA: Insufficient documentation

## 2024-09-21 DIAGNOSIS — Z79810 Long term (current) use of selective estrogen receptor modulators (SERMs): Secondary | ICD-10-CM | POA: Insufficient documentation

## 2024-09-21 DIAGNOSIS — C7951 Secondary malignant neoplasm of bone: Secondary | ICD-10-CM | POA: Diagnosis not present

## 2024-09-21 DIAGNOSIS — Z170 Estrogen receptor positive status [ER+]: Secondary | ICD-10-CM | POA: Diagnosis not present
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
## 2024-09-22 NOTE — Assessment & Plan Note (Signed)
 10/04/2021: Palpable left breast mass and left breast swelling for 2 months, mammogram revealed 9 cm abnormality at 2 o'clock position plus another mass at 8:00 measuring 1.3 cm: 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 Bone scan 10/23/2021: No evidence of bony metastases.  Left mandible activity benign   Treatment plan: 1.  Neoadjuvant chemotherapy with dose dense Adriamycin  and Cytoxan  followed by Taxol  2. modified radical mastectomy 3.  Adjuvant radiation 4.  Adjuvant antiestrogen therapy (declined ovarian suppression + Verzenio, on Tamoxifen ) ------------------------------------------------------------------------------------------------------------------------------------------- Current treatment: Tamoxifen  daily Tamoxifen  toxicities: Hot flashes but tolerable Chemo-induced peripheral neuropathy: Stable Alicia Morton has no clinical signs of breast cancer recurrence.  She will continue on tamoxifen  daily. Recommended consideration of PEA supplement for neuropathy.   Recommended healthy diet and exercise.   RTC in 01/2025 as scheduled for f/u with Dr. Gudena.

## 2024-09-24 ENCOUNTER — Ambulatory Visit

## 2024-09-29 ENCOUNTER — Encounter: Payer: Self-pay | Admitting: Physical Therapy

## 2024-09-29 ENCOUNTER — Ambulatory Visit: Admitting: Physical Therapy

## 2024-09-29 NOTE — Therapy (Signed)
 Note created in error.

## 2024-09-30 ENCOUNTER — Ambulatory Visit: Admitting: Physical Therapy
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
## 2024-10-01 ENCOUNTER — Ambulatory Visit

## 2024-10-05 ENCOUNTER — Ambulatory Visit: Admitting: Physical Therapy

## 2024-10-08 ENCOUNTER — Ambulatory Visit: Admitting: Physical Therapy

## 2024-10-12 ENCOUNTER — Encounter: Admitting: Physical Therapy

## 2024-10-15 ENCOUNTER — Encounter: Admitting: Physical Therapy

## 2024-10-19 ENCOUNTER — Encounter: Payer: Self-pay | Admitting: Radiology

## 2024-11-19 ENCOUNTER — Other Ambulatory Visit: Payer: Self-pay | Admitting: Critical Care Medicine

## 2024-11-19 DIAGNOSIS — J4521 Mild intermittent asthma with (acute) exacerbation: Secondary | ICD-10-CM

## 2024-12-05 ENCOUNTER — Encounter: Payer: Self-pay | Admitting: Hematology and Oncology

## 2024-12-07 ENCOUNTER — Ambulatory Visit: Attending: Hematology and Oncology

## 2024-12-07 VITALS — Wt 243.2 lb

## 2024-12-07 DIAGNOSIS — Z483 Aftercare following surgery for neoplasm: Secondary | ICD-10-CM | POA: Insufficient documentation
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
## 2024-12-22 ENCOUNTER — Other Ambulatory Visit: Payer: Self-pay | Admitting: *Deleted

## 2024-12-22 DIAGNOSIS — Z170 Estrogen receptor positive status [ER+]: Secondary | ICD-10-CM

## 2024-12-22 NOTE — Progress Notes (Signed)
 [employer] renewal orders placed.

## 2025-01-13 LAB — SIGNATERA
SIGNATERA MTM READOUT: 0 MTM/ml
SIGNATERA TEST RESULT: NEGATIVE

## 2025-01-18 ENCOUNTER — Inpatient Hospital Stay: Payer: Medicaid Other | Admitting: Hematology and Oncology

## 2025-02-15 ENCOUNTER — Ambulatory Visit: Admitting: Family Medicine

## 2025-06-07 ENCOUNTER — Ambulatory Visit
